# Patient Record
Sex: Male | Born: 1965
Health system: Southern US, Community
[De-identification: ages and names within clinical notes are randomized; demographics above are authoritative.]

## PROBLEM LIST (undated history)

## (undated) DIAGNOSIS — E785 Hyperlipidemia, unspecified: Secondary | ICD-10-CM

## (undated) DIAGNOSIS — F419 Anxiety disorder, unspecified: Secondary | ICD-10-CM

## (undated) DIAGNOSIS — I240 Acute coronary thrombosis not resulting in myocardial infarction: Secondary | ICD-10-CM

## (undated) DIAGNOSIS — I509 Heart failure, unspecified: Secondary | ICD-10-CM

## (undated) DIAGNOSIS — R7303 Prediabetes: Secondary | ICD-10-CM

## (undated) DIAGNOSIS — F1021 Alcohol dependence, in remission: Secondary | ICD-10-CM

## (undated) DIAGNOSIS — T7840XA Allergy, unspecified, initial encounter: Secondary | ICD-10-CM

## (undated) DIAGNOSIS — F329 Major depressive disorder, single episode, unspecified: Secondary | ICD-10-CM

## (undated) DIAGNOSIS — F32A Depression, unspecified: Secondary | ICD-10-CM

## (undated) HISTORY — DX: Prediabetes: R73.03

## (undated) HISTORY — PX: OTHER SURGICAL HISTORY: SHX169

## (undated) HISTORY — DX: Major depressive disorder, single episode, unspecified: F32.9

## (undated) HISTORY — DX: Hyperlipidemia, unspecified: E78.5

## (undated) HISTORY — DX: Anxiety disorder, unspecified: F41.9

## (undated) HISTORY — DX: Alcohol dependence, in remission: F10.21

## (undated) HISTORY — DX: Depression, unspecified: F32.A

## (undated) HISTORY — DX: Heart failure, unspecified: I50.9

## (undated) HISTORY — DX: Allergy, unspecified, initial encounter: T78.40XA

## (undated) HISTORY — DX: Acute coronary thrombosis not resulting in myocardial infarction: I24.0

---

## 2013-06-23 HISTORY — PX: CORONARY ANGIOPLASTY WITH STENT PLACEMENT: SHX49

## 2015-04-02 ENCOUNTER — Encounter: Payer: Self-pay | Admitting: Family Medicine

## 2015-04-02 ENCOUNTER — Ambulatory Visit (INDEPENDENT_AMBULATORY_CARE_PROVIDER_SITE_OTHER): Payer: Self-pay | Admitting: Family Medicine

## 2015-04-02 VITALS — BP 122/83 | HR 68 | Temp 98.2°F | Resp 14 | Ht 68.0 in | Wt 190.0 lb

## 2015-04-02 DIAGNOSIS — G8929 Other chronic pain: Secondary | ICD-10-CM

## 2015-04-02 DIAGNOSIS — F1021 Alcohol dependence, in remission: Secondary | ICD-10-CM

## 2015-04-02 DIAGNOSIS — M25559 Pain in unspecified hip: Secondary | ICD-10-CM

## 2015-04-02 DIAGNOSIS — F17200 Nicotine dependence, unspecified, uncomplicated: Secondary | ICD-10-CM

## 2015-04-02 DIAGNOSIS — K760 Fatty (change of) liver, not elsewhere classified: Secondary | ICD-10-CM

## 2015-04-02 DIAGNOSIS — M25552 Pain in left hip: Secondary | ICD-10-CM

## 2015-04-02 DIAGNOSIS — I5022 Chronic systolic (congestive) heart failure: Secondary | ICD-10-CM | POA: Insufficient documentation

## 2015-04-02 DIAGNOSIS — R748 Abnormal levels of other serum enzymes: Secondary | ICD-10-CM | POA: Insufficient documentation

## 2015-04-02 DIAGNOSIS — Z0000 Encounter for general adult medical examination without abnormal findings: Secondary | ICD-10-CM

## 2015-04-02 DIAGNOSIS — R7303 Prediabetes: Secondary | ICD-10-CM | POA: Insufficient documentation

## 2015-04-02 DIAGNOSIS — Z23 Encounter for immunization: Secondary | ICD-10-CM

## 2015-04-02 DIAGNOSIS — E785 Hyperlipidemia, unspecified: Secondary | ICD-10-CM | POA: Insufficient documentation

## 2015-04-02 LAB — CBC WITH DIFFERENTIAL/PLATELET
BASOS PCT: 0 % (ref 0–1)
Basophils Absolute: 0 10*3/uL (ref 0.0–0.1)
Eosinophils Absolute: 0.1 10*3/uL (ref 0.0–0.7)
Eosinophils Relative: 2 % (ref 0–5)
HCT: 46.8 % (ref 39.0–52.0)
HEMOGLOBIN: 16.6 g/dL (ref 13.0–17.0)
Lymphocytes Relative: 47 % — ABNORMAL HIGH (ref 12–46)
Lymphs Abs: 3.2 10*3/uL (ref 0.7–4.0)
MCH: 32.7 pg (ref 26.0–34.0)
MCHC: 35.5 g/dL (ref 30.0–36.0)
MCV: 92.3 fL (ref 78.0–100.0)
MONOS PCT: 8 % (ref 3–12)
MPV: 9.3 fL (ref 8.6–12.4)
Monocytes Absolute: 0.6 10*3/uL (ref 0.1–1.0)
NEUTROS ABS: 3 10*3/uL (ref 1.7–7.7)
NEUTROS PCT: 43 % (ref 43–77)
Platelets: 198 10*3/uL (ref 150–400)
RBC: 5.07 MIL/uL (ref 4.22–5.81)
RDW: 14.1 % (ref 11.5–15.5)
WBC: 6.9 10*3/uL (ref 4.0–10.5)

## 2015-04-02 LAB — COMPLETE METABOLIC PANEL WITH GFR
ALBUMIN: 4.4 g/dL (ref 3.6–5.1)
ALK PHOS: 65 U/L (ref 40–115)
ALT: 19 U/L (ref 9–46)
AST: 20 U/L (ref 10–35)
BILIRUBIN TOTAL: 0.4 mg/dL (ref 0.2–1.2)
BUN: 13 mg/dL (ref 7–25)
CO2: 28 mmol/L (ref 20–31)
Calcium: 10.2 mg/dL (ref 8.6–10.3)
Chloride: 102 mmol/L (ref 98–110)
Creat: 0.93 mg/dL (ref 0.70–1.33)
GFR, Est African American: >89 mL/min (ref >=60)
GFR, Est Non African American: >89 mL/min (ref >=60)
GLUCOSE: 88 mg/dL (ref 65–99)
Potassium: 4.7 mmol/L (ref 3.5–5.3)
Sodium: 138 mmol/L (ref 135–146)
TOTAL PROTEIN: 6.8 g/dL (ref 6.1–8.1)

## 2015-04-02 LAB — HEMOGLOBIN A1C
Hgb A1c MFr Bld: 5.8 % — ABNORMAL HIGH (ref <5.7)
MEAN PLASMA GLUCOSE: 120 mg/dL — AB (ref <117)

## 2015-04-02 LAB — LIPASE: LIPASE: 64 U/L — AB (ref 7–60)

## 2015-04-02 NOTE — Patient Instructions (Addendum)
Return to office for fasting cholesterol panel.  Recommend a lowfat, low carbohydrate diet divided over 5-6 small meals, increase water intake to 6-8 glasses, and 150 minutes per week of cardiovascular exercise.  Continue with current medication regimen.  Will call patient with laboratory results.    Heart Failure Heart failure is a condition in which the heart has trouble pumping blood. This means your heart does not pump blood efficiently for your body to work well. In some cases of heart failure, fluid may back up into your lungs or you may have swelling (edema) in your lower legs. Heart failure is usually a long-term (chronic) condition. It is important for you to take good care of yourself and follow your health care provider's treatment plan. CAUSES  Some health conditions can cause heart failure. Those health conditions include:  High blood pressure (hypertension). Hypertension causes the heart muscle to work harder than normal. When pressure in the blood vessels is high, the heart needs to pump (contract) with more force in order to circulate blood throughout the body. High blood pressure eventually causes the heart to become stiff and weak.  Coronary artery disease (CAD). CAD is the buildup of cholesterol and fat (plaque) in the arteries of the heart. The blockage in the arteries deprives the heart muscle of oxygen and blood. This can cause chest pain and may lead to a heart attack. High blood pressure can also contribute to CAD.  Heart attack (myocardial infarction). A heart attack occurs when one or more arteries in the heart become blocked. The loss of oxygen damages the muscle tissue of the heart. When this happens, part of the heart muscle dies. The injured tissue does not contract as well and weakens the heart's ability to pump blood.  Abnormal heart valves. When the heart valves do not open and close properly, it can cause heart failure. This makes the heart muscle pump harder to  keep the blood flowing.  Heart muscle disease (cardiomyopathy or myocarditis). Heart muscle disease is damage to the heart muscle from a variety of causes. These can include drug or alcohol abuse, infections, or unknown reasons. These can increase the risk of heart failure.  Lung disease. Lung disease makes the heart work harder because the lungs do not work properly. This can cause a strain on the heart, leading it to fail.  Diabetes. Diabetes increases the risk of heart failure. High blood sugar contributes to high fat (lipid) levels in the blood. Diabetes can also cause slow damage to tiny blood vessels that carry important nutrients to the heart muscle. When the heart does not get enough oxygen and food, it can cause the heart to become weak and stiff. This leads to a heart that does not contract efficiently.  Other conditions can contribute to heart failure. These include abnormal heart rhythms, thyroid problems, and low blood counts (anemia). Certain unhealthy behaviors can increase the risk of heart failure, including:  Being overweight.  Smoking or chewing tobacco.  Eating foods high in fat and cholesterol.  Abusing illicit drugs or alcohol.  Lacking physical activity. SYMPTOMS  Heart failure symptoms may vary and can be hard to detect. Symptoms may include:  Shortness of breath with activity, such as climbing stairs.  Persistent cough.  Swelling of the feet, ankles, legs, or abdomen.  Unexplained weight gain.  Difficulty breathing when lying flat (orthopnea).  Waking from sleep because of the need to sit up and get more air.  Rapid heartbeat.  Fatigue and loss  of energy.  Feeling light-headed, dizzy, or close to fainting.  Loss of appetite.  Nausea.  Increased urination during the night (nocturia). DIAGNOSIS  A diagnosis of heart failure is based on your history, symptoms, physical examination, and diagnostic tests. Diagnostic tests for heart failure may  include:  Echocardiography.  Electrocardiography.  Chest X-ray.  Blood tests.  Exercise stress test.  Cardiac angiography.  Radionuclide scans. TREATMENT  Treatment is aimed at managing the symptoms of heart failure. Medicines, behavioral changes, or surgical intervention may be necessary to treat heart failure.  Medicines to help treat heart failure may include:  Angiotensin-converting enzyme (ACE) inhibitors. This type of medicine blocks the effects of a blood protein called angiotensin-converting enzyme. ACE inhibitors relax (dilate) the blood vessels and help lower blood pressure.  Angiotensin receptor blockers (ARBs). This type of medicine blocks the actions of a blood protein called angiotensin. Angiotensin receptor blockers dilate the blood vessels and help lower blood pressure.  Water pills (diuretics). Diuretics cause the kidneys to remove salt and water from the blood. The extra fluid is removed through urination. This loss of extra fluid lowers the volume of blood the heart pumps.  Beta blockers. These prevent the heart from beating too fast and improve heart muscle strength.  Digitalis. This increases the force of the heartbeat.  Healthy behavior changes include:  Obtaining and maintaining a healthy weight.  Stopping smoking or chewing tobacco.  Eating heart-healthy foods.  Limiting or avoiding alcohol.  Stopping illicit drug use.  Physical activity as directed by your health care provider.  Surgical treatment for heart failure may include:  A procedure to open blocked arteries, repair damaged heart valves, or remove damaged heart muscle tissue.  A pacemaker to improve heart muscle function and control certain abnormal heart rhythms.  An internal cardioverter defibrillator to treat certain serious abnormal heart rhythms.  A left ventricular assist device (LVAD) to assist the pumping ability of the heart. HOME CARE INSTRUCTIONS   Take medicines only  as directed by your health care provider. Medicines are important in reducing the workload of your heart, slowing the progression of heart failure, and improving your symptoms.  Do not stop taking your medicine unless directed by your health care provider.  Do not skip any dose of medicine.  Refill your prescriptions before you run out of medicine. Your medicines are needed every day.  Engage in moderate physical activity if directed by your health care provider. Moderate physical activity can benefit some people. The elderly and people with severe heart failure should consult with a health care provider for physical activity recommendations.  Eat heart-healthy foods. Food choices should be free of trans fat and low in saturated fat, cholesterol, and salt (sodium). Healthy choices include fresh or frozen fruits and vegetables, fish, lean meats, legumes, fat-free or low-fat dairy products, and whole grain or high fiber foods. Talk to a dietitian to learn more about heart-healthy foods.  Limit sodium if directed by your health care provider. Sodium restriction may reduce symptoms of heart failure in some people. Talk to a dietitian to learn more about heart-healthy seasonings.  Use healthy cooking methods. Healthy cooking methods include roasting, grilling, broiling, baking, poaching, steaming, or stir-frying. Talk to a dietitian to learn more about healthy cooking methods.  Limit fluids if directed by your health care provider. Fluid restriction may reduce symptoms of heart failure in some people.  Weigh yourself every day. Daily weights are important in the early recognition of excess fluid. You should  weigh yourself every morning after you urinate and before you eat breakfast. Wear the same amount of clothing each time you weigh yourself. Record your daily weight. Provide your health care provider with your weight record.  Monitor and record your blood pressure if directed by your health care  provider.  Check your pulse if directed by your health care provider.  Lose weight if directed by your health care provider. Weight loss may reduce symptoms of heart failure in some people.  Stop smoking or chewing tobacco. Nicotine makes your heart work harder by causing your blood vessels to constrict. Do not use nicotine gum or patches before talking to your health care provider.  Keep all follow-up visits as directed by your health care provider. This is important.  Limit alcohol intake to no more than 1 drink per day for nonpregnant women and 2 drinks per day for men. One drink equals 12 ounces of beer, 5 ounces of wine, or 1 ounces of hard liquor. Drinking more than that is harmful to your heart. Tell your health care provider if you drink alcohol several times a week. Talk with your health care provider about whether alcohol is safe for you. If your heart has already been damaged by alcohol or you have severe heart failure, drinking alcohol should be stopped completely.  Stop illicit drug use.  Stay up-to-date with immunizations. It is especially important to prevent respiratory infections through current pneumococcal and influenza immunizations.  Manage other health conditions such as hypertension, diabetes, thyroid disease, or abnormal heart rhythms as directed by your health care provider.  Learn to manage stress.  Plan rest periods when fatigued.  Learn strategies to manage high temperatures. If the weather is extremely hot:  Avoid vigorous physical activity.  Use air conditioning or fans or seek a cooler location.  Avoid caffeine and alcohol.  Wear loose-fitting, lightweight, and light-colored clothing.  Learn strategies to manage cold temperatures. If the weather is extremely cold:  Avoid vigorous physical activity.  Layer clothes.  Wear mittens or gloves, a hat, and a scarf when going outside.  Avoid alcohol.  Obtain ongoing education and support as  needed.  Participate in or seek rehabilitation as needed to maintain or improve independence and quality of life. SEEK MEDICAL CARE IF:   You have a rapid weight gain.  You have increasing shortness of breath that is unusual for you.  You are unable to participate in your usual physical activities.  You tire easily.  You cough more than normal, especially with physical activity.  You have any or more swelling in areas such as your hands, feet, ankles, or abdomen.  You are unable to sleep because it is hard to breathe.  You feel like your heart is beating fast (palpitations).  You become dizzy or light-headed upon standing up. SEEK IMMEDIATE MEDICAL CARE IF:   You have difficulty breathing.  There is a change in mental status such as decreased alertness or difficulty with concentration.  You have a pain or discomfort in your chest.  You have an episode of fainting (syncope). MAKE SURE YOU:   Understand these instructions.  Will watch your condition.  Will get help right away if you are not doing well or get worse.   This information is not intended to replace advice given to you by your health care provider. Make sure you discuss any questions you have with your health care provider.   Document Released: 06/09/2005 Document Revised: 10/24/2014 Document Reviewed: 07/09/2012 Elsevier  Interactive Patient Education 2016 Else[employer] Inc[employer] Hip Pain Your hip is the joint between your upper legs and your lower pelvis. The bones, cartilage, tendons, and muscles of your hip joint perform a lot of work each day supporting your body weight and allowing you to move around. Hip pain can range from a minor ache to severe pain in one or both of your hips. Pain may be felt on the inside of the hip joint near the groin, or the outside near the buttocks and upper thigh. You may have swelling or stiffness as well.  HOME CARE INSTRUCTIONS   Take medicines only as directed by your health care  provider.  Apply ice to the injured area:  Put ice in a plastic bag.  Place a towel between your skin and the bag.  Leave the ice on for 15-20 minutes at a time, 3-4 times a day.  Keep your leg raised (elevated) when possible to lessen swelling.  Avoid activities that cause pain.  Follow specific exercises as directed by your health care provider.  Sleep with a pillow between your legs on your most comfortable side.  Record how often you have hip pain, the location of the pain, and what it feels like. SEEK MEDICAL CARE IF:   You are unable to put weight on your leg.  Your hip is red or swollen or very tender to touch.  Your pain or swelling continues or worsens after 1 week.  You have increasing difficulty walking.  You have a fever. SEEK IMMEDIATE MEDICAL CARE IF:   You have fallen.  You have a sudden increase in pain and swelling in your hip. MAKE SURE YOU:   Understand these instructions.  Will watch your condition.  Will get help right away if you are not doing well or get worse.   This information is not intended to replace advice given to you by your health care provider. Make sure you discuss any questions you have with your health care provider.   Document Released: 11/27/2009 Document Revised: 06/30/2014 Document Reviewed: 02/03/2013 Elsevier Interactive Patient Education 2016 Elsevier Inc. Cholesterol Cholesterol is a white, waxy, fat-like substance needed by your body in small amounts. The liver makes all the cholesterol you need. Cholesterol is carried from the liver by the blood through the blood vessels. Deposits of cholesterol (plaque) may build up on blood vessel walls. These make the arteries narrower and stiffer. Cholesterol plaques increase the risk for heart attack and stroke.  You cannot feel your cholesterol level even if it is very high. The only way to know it is high is with a blood test. Once you know your cholesterol levels, you should keep  a record of the test results. Work with your health care provider to keep your levels in the desired range.  WHAT DO THE RESULTS MEAN?  Total cholesterol is a rough measure of all the cholesterol in your blood.   LDL is the so-called bad cholesterol. This is the type that deposits cholesterol in the walls of the arteries. You want this level to be low.   HDL is the good cholesterol because it cleans the arteries and carries the LDL away. You want this level to be high.  Triglycerides are fat that the body can either burn for energy or store. High levels are closely linked to heart disease.  WHAT ARE THE DESIRED LEVELS OF CHOLESTEROL?  Total cholesterol below 200.   LDL below 100 for people at risk, below 70 for  those at very high risk.   HDL above 50 is good, above 60 is best.   Triglycerides below 150.  HOW CAN I LOWER MY CHOLESTEROL?  Diet. Follow your diet programs as directed by your health care provider.   Choose fish or white meat chicken and [place], roasted or baked. Limit fatty cuts of red meat, fried foods, and processed meats, such as sausage and lunch meats.   Eat lots of fresh fruits and vegetables.  Choose whole grains, beans, pasta, potatoes, and cereals.   Use only small amounts of olive, corn, or canola oils.   Avoid butter, mayonnaise, shortening, or palm kernel oils.  Avoid foods with trans fats.   Drink skim or nonfat milk and eat low-fat or nonfat yogurt and cheeses. Avoid whole milk, cream, ice cream, egg yolks, and full-fat cheeses.   Healthy desserts include angel food cake, ginger snaps, animal crackers, hard candy, popsicles, and low-fat or nonfat frozen yogurt. Avoid pastries, cakes, pies, and cookies.   Exercise. Follow your exercise programs as directed by your health care provider.   A regular program helps decrease LDL and raise HDL.   A regular program helps with weight control.   Do things that increase your activity level  like gardening, walking, or taking the stairs. Ask your health care provider about how you can be more active in your daily life.   Medicine. Take medicine only as directed by your health care provider.   Medicine may be prescribed by your health care provider to help lower cholesterol and decrease the risk for heart disease.   If you have several risk factors, you may need medicine even if your levels are normal.   This information is not intended to replace advice given to you by your health care provider. Make sure you discuss any questions you have with your health care provider.   Document Released: 03/04/2001 Document Revised: 06/30/2014 Document Reviewed: 03/23/2013 Elsevier Interactive Patient Education [employer].

## 2015-04-02 NOTE — Progress Notes (Signed)
Subjective:    Patient ID: Alex Charles, male    DOB: 05-09-1966, 49 y.o.   MRN: [id number]  HPI Alex Charles, a 49 year old male presents to establish care. He states that he recently relocated from [place]. He reports that he was a patient of Dr. Kathlene November at [facility]. He reports that he had a Platinum Chromium Coronary Stent System  placed on October 17, 2014. He states that he is taking medications consistently. He reports that his last follow up appointment is in May. He also states that he is an alcoholic in remission. He has not had a drink since April 2016. He states that he has a history of alcoholic pancreatitis with elevated lipase levels. He states that he is an everyday tobacco smoker of 0.5 packs per day. He reports that he has attempted smoking cession in the past without success.   Patient complains of left hip pain. Left hip pain has been occurring for the past year.  Pain intensity is currently 2/10. Pain is alleviated by rest and over the counter Aleve last taken several days ago.Radiates to left groin or left knee. Family history of arthris. Current symptoms include is worse with weight bearing and is worse after period of inactivity. Associated symptoms:Aggravating symptoms: going up and down stairs, lateral movements and rising after sitting.Patient has not had any evaluation of hip pain to date.  History reviewed. No pertinent past medical history. Immunization History  Administered Date(s) Administered  . Influenza,inj,Quad PF,36+ Mos 04/02/2015   Social History   Social History  . Marital Status: Single    Spouse Name: N/A  . Number of Children: N/A  . Years of Education: N/A   Occupational History  . Not on file.   Social History Main Topics  . Smoking status: Current Every Day Smoker -- 0.50 packs/day  . Smokeless tobacco: Never Used  . Alcohol Use: No  . Drug Use: No  . Sexual Activity: Not on file   Other Topics Concern   . Not on file   Social History Narrative  . No narrative on file    Review of Systems  Constitutional: Negative.  Negative for fatigue.  Eyes: Negative.  Negative for photophobia and visual disturbance.  Respiratory: Negative.   Cardiovascular: Negative.   Gastrointestinal: Positive for constipation. Negative for blood in stool.  Endocrine: Negative for polydipsia, polyphagia and polyuria.  Genitourinary: Negative.  Negative for urgency and testicular pain.  Musculoskeletal: Positive for arthralgias (left hip pain).  Skin: Negative.   Allergic/Immunologic: Negative.  Negative for immunocompromised state.  Neurological: Negative.  Negative for dizziness, facial asymmetry and numbness.  Hematological: Negative.   Psychiatric/Behavioral: Positive for sleep disturbance. Negative for suicidal ideas. The patient is nervous/anxious.    Urinalysis results.     Objective:   Physical Exam  Constitutional: He is oriented to person, place, and time. He appears well-developed and well-nourished.  HENT:  Head: Normocephalic and atraumatic.  Right Ear: External ear normal.  Left Ear: External ear normal.  Nose: Nose normal.  Mouth/Throat: Oropharynx is clear and moist.  Eyes: Conjunctivae and EOM are normal. Pupils are equal, round, and reactive to light.  Neck: Normal range of motion. Neck supple.  Cardiovascular: Normal rate, regular rhythm, normal heart sounds and intact distal pulses.   Pulmonary/Chest: Effort normal and breath sounds normal.  Abdominal: Soft. Bowel sounds are normal.  Musculoskeletal: Normal range of motion.  Neurological: He is alert and oriented to person, place,  and time. He has normal reflexes.  Skin: Skin is warm and dry. Nails show no clubbing.  Round, raised,  Skin tag, rough, and non tender to palpation.  Right posterior leg. 0.5 cm     Psychiatric: He has a normal mood and affect. His behavior is normal. Thought content normal.          BP 122/83  mmHg  Pulse 68  Temp(Src) 98.2 F (36.8 C) (Oral)  Resp 14  Ht 5\' 8"  (1.727 m)  Wt 190 lb (86.183 kg)  BMI 28.90 kg/m2 Assessment & Plan:  1. Chronic systolic heart failure ([place]) Will send referral to cardiologist for further evaluation due to extensive cardiovascular history. Patient had a platinum chromium coronry stent system placed in April 2016 by Dr. Dorothyann Peng. Patient states that he has not followed with cardiology since May due to insurance constraints. Will continue with current medication regimen.    - clopidogrel (PLAVIX) 75 MG tablet; Take 75 mg by mouth daily. - spironolactone (ALDACTONE) 25 MG tablet; Take 25 mg by mouth daily. - furosemide (LASIX) 20 MG tablet; Take 20 mg by mouth. - carvedilol (COREG) 3.125 MG tablet; Take 3.125 mg by mouth 2 (two) times daily with a meal. 1/2 tablet bid  2. Hyperlipidemia LDL goal <100  - clopidogrel (PLAVIX) 75 MG tablet; Take 75 mg by mouth daily. - aspirin 81 MG chewable tablet; Chew by mouth daily. - EKG 12-Lead  3. Fatty liver Reviewed medical records from [place]. Patient has a history of fatty liver. Continue low fat diet and exercise regimen.   4. Prediabetes Reviewed labs, previous hemoglobin A1C was 5.7 %. Will re-check hemoglobin A1C. Recommend a lowfat, low carbohydrate diet divided over 5-6 small meals, increase water intake to 6-8 glasses, and 150 minutes per week of cardiovascular exercise.    5. Alcoholism in remission [facility]) Alex Charles has not had an alcoholic beverage in 6 months. Will continue to refrain from alcohol use.  - folic acid (FOLVITE) 158 MCG tablet; Take 400 mcg by mouth daily.  6.Hip pain, chronic, left - Naproxen Sodium (ALEVE PO); Take by mouth. - Sedimentation Rate    7. Elevated lipase  - Lipase  8. Need for immunization against influenza  - Flu Vaccine QUAD 36+ mos IM (Fluarix)   9. Routine health maintenance Will schedule follow up for prostate exam. Will update vaccinations in  chart.  - POCT urinalysis dipstick - Hemoglobin A1c - CBC with Differential - COMPLETE METABOLIC PANEL WITH GFR - TSH  The patient was given clear instructions to go to ER or return to medical center if symptoms do not improve, worsen or new problems develop. The patient verbalized understanding. Will notify patient with laboratory results. Alex Dew, FNP

## 2015-04-03 LAB — TSH: TSH: 1.853 u[IU]/mL (ref 0.350–4.500)

## 2015-04-04 ENCOUNTER — Telehealth: Payer: Self-pay | Admitting: Family Medicine

## 2015-04-04 ENCOUNTER — Ambulatory Visit: Payer: Self-pay | Admitting: Family Medicine

## 2015-04-04 NOTE — Telephone Encounter (Signed)
Reviewed labs. Hemoglobin a1C elevated at 5.8%, goal is <5.7%. Recommend a lowfat, low carbohydrate diet divided over 5-6 small meals, increase water intake to 6-8 glasses, and 150 minutes per week of cardiovascular exercise.   Lipase mildly elevated at 64. History of fatty liver disease noted.    Patient to follow up in 6 months for complete physical examination.    Dorena Dew, FNP

## 2015-04-04 NOTE — Telephone Encounter (Signed)
Patient called and advised of labs and recommendations [place] has advised. Patient had no further questions at this time. Thanks!

## 2015-05-21 ENCOUNTER — Encounter (HOSPITAL_COMMUNITY): Payer: Self-pay | Admitting: Emergency Medicine

## 2015-05-21 ENCOUNTER — Emergency Department (INDEPENDENT_AMBULATORY_CARE_PROVIDER_SITE_OTHER)
Admission: EM | Admit: 2015-05-21 | Discharge: 2015-05-21 | Disposition: A | Discharge status: Home / Self Care | Payer: Self-pay | Source: Home / Self Care | Attending: Family Medicine | Admitting: Family Medicine

## 2015-05-21 DIAGNOSIS — M5431 Sciatica, right side: Secondary | ICD-10-CM

## 2015-05-21 MED ORDER — PREDNISONE 20 MG PO TABS: ORAL_TABLET | ORAL | Status: DC

## 2015-05-21 NOTE — ED Notes (Signed)
C/o right leg pain onset 1 month.... Report pain starts at hip and radiates down the right leg Recalls pain began when he got up from a picnic table and felt a sharp pain A&O x4... No acute distress.

## 2015-05-21 NOTE — Discharge Instructions (Signed)

## 2015-05-21 NOTE — ED Provider Notes (Signed)
CSN: [id number]     Arrival date & time 05/21/15  1604 History   First MD Initiated Contact with Patient 05/21/15 1803     Chief Complaint  Patient presents with  . Leg Pain   (Consider location/radiation/quality/duration/timing/severity/associated sxs/prior Treatment) HPI Comments: [age]-year-old male complaining of right leg pain. It started approximately 7-10 days ago. He states that he was sitting on a picnic table and as he was getting off the table he experienced sudden acute pain. The pain is primarily constant but often worse with certain positions, prolonged sitting, bending and flexion at the hip. Pain is located to the upper and mid right buttock. It tends to radiate down the middle of the right buttock and then laterally to the anterior thigh and knee. No known trauma. No blunt trauma. No falls. He also states there is a loss of sensation over the right calf area.   History reviewed. No pertinent past medical history. Past Surgical History  Procedure Laterality Date  . Coronary angioplasty with stent placement     Family History  Problem Relation Age of Onset  . Cancer Mother     colon   . Hypertension Father    Social History  Substance Use Topics  . Smoking status: Current Every Day Smoker -- 0.50 packs/day  . Smokeless tobacco: Never Used  . Alcohol Use: No    Review of Systems  Constitutional: Positive for activity change. Negative for fever and fatigue.  HENT: Negative.   Respiratory: Negative.   Cardiovascular: Negative.   Gastrointestinal: Negative.   Musculoskeletal: Positive for back pain. Negative for joint swelling.  Skin: Negative.   Neurological: Positive for numbness. Negative for dizziness, tremors, syncope, facial asymmetry, speech difficulty and headaches.    Allergies  Review of patient's allergies indicates no known allergies.  Home Medications   Prior to Admission medications   Medication Sig Start Date End Date Taking? Authorizing  Provider  acetaminophen (TYLENOL) 500 MG tablet Take 500 mg by mouth every 6 (six) hours as needed.   Yes Historical Provider, MD  aspirin 81 MG chewable tablet Chew by mouth daily.   Yes Historical Provider, MD  carvedilol (COREG) 3.125 MG tablet Take 3.125 mg by mouth 2 (two) times daily with a meal. 1/2 tablet bid   Yes Historical Provider, MD  clopidogrel (PLAVIX) 75 MG tablet Take 75 mg by mouth daily.   Yes Historical Provider, MD  folic acid (FOLVITE) A999333 MCG tablet Take 400 mcg by mouth daily.   Yes Historical Provider, MD  furosemide (LASIX) 20 MG tablet Take 20 mg by mouth.   Yes Historical Provider, MD  spironolactone (ALDACTONE) 25 MG tablet Take 25 mg by mouth daily.   Yes Historical Provider, MD  Naproxen Sodium (ALEVE PO) Take by mouth.    Historical Provider, MD  predniSONE (DELTASONE) 20 MG tablet 3 Tabs PO Days 1-3, then 2 tabs PO Days 4-6, then 1 tab PO Day 7-9, then Half Tab PO Day 10-12 05/21/15   Janne Napoleon, NP   Meds Ordered and Administered this Visit  Medications - No data to display  BP 126/88 mmHg  Pulse 84  Temp(Src) 98.8 F (37.1 C) (Oral)  Resp 16  SpO2 96% No data found.   Physical Exam  Constitutional: He appears well-developed and well-nourished. No distress.  Neck: Normal range of motion. Neck supple.  Cardiovascular: Normal rate.   Pulmonary/Chest: Effort normal. No respiratory distress.  Musculoskeletal: He exhibits tenderness. He exhibits no edema.  With  deep palpation there is tenderness to the upper mid right buttock. Lesser tenderness to the lateral hip. There is also tenderness to the anterior quadriceps  Straight leg raise is positive.  passive flexion  the right hip produces severe pain within the hip that radiates along the lateral and anterior thigh.   Neurological: He is alert.  Skin: Skin is warm and dry.  Psychiatric: He has a normal mood and affect.  Nursing note and vitals reviewed.   ED Course  Procedures (including critical  care time)  Labs Review Labs Reviewed - No data to display  Imaging Review No results found.   Visual Acuity Review  Right Eye Distance:   Left Eye Distance:   Bilateral Distance:    Right Eye Near:   Left Eye Near:    Bilateral Near:         MDM   1. Sciatica neuralgia, right    Prednisone taper F/u with PCP this week. May need additional testing.    Janne Napoleon, NP 05/21/15 [phone]

## 2015-05-29 ENCOUNTER — Ambulatory Visit (INDEPENDENT_AMBULATORY_CARE_PROVIDER_SITE_OTHER): Payer: Self-pay | Admitting: Family Medicine

## 2015-05-29 ENCOUNTER — Ambulatory Visit (HOSPITAL_COMMUNITY)
Admission: RE | Admit: 2015-05-29 | Discharge: 2015-05-29 | Disposition: A | Discharge status: Home / Self Care | Payer: Medicaid Other | Source: Ambulatory Visit | Attending: Family Medicine | Admitting: Family Medicine

## 2015-05-29 DIAGNOSIS — G8929 Other chronic pain: Secondary | ICD-10-CM

## 2015-05-29 DIAGNOSIS — M25551 Pain in right hip: Secondary | ICD-10-CM | POA: Insufficient documentation

## 2015-05-29 DIAGNOSIS — K760 Fatty (change of) liver, not elsewhere classified: Secondary | ICD-10-CM

## 2015-05-29 DIAGNOSIS — M5431 Sciatica, right side: Secondary | ICD-10-CM

## 2015-05-29 LAB — COMPLETE METABOLIC PANEL WITH GFR
ALBUMIN: 4.5 g/dL (ref 3.6–5.1)
ALK PHOS: 64 U/L (ref 40–115)
ALT: 23 U/L (ref 9–46)
AST: 22 U/L (ref 10–35)
BILIRUBIN TOTAL: 0.5 mg/dL (ref 0.2–1.2)
BUN: 13 mg/dL (ref 7–25)
CALCIUM: 9.5 mg/dL (ref 8.6–10.3)
CO2: 26 mmol/L (ref 20–31)
Chloride: 103 mmol/L (ref 98–110)
Creat: 0.77 mg/dL (ref 0.70–1.33)
GFR, Est African American: >89 mL/min (ref >=60)
GFR, Est Non African American: >89 mL/min (ref >=60)
Glucose, Bld: 94 mg/dL (ref 65–99)
POTASSIUM: 4.5 mmol/L (ref 3.5–5.3)
Sodium: 137 mmol/L (ref 135–146)
TOTAL PROTEIN: 6.8 g/dL (ref 6.1–8.1)

## 2015-05-29 MED ORDER — GABAPENTIN 100 MG PO CAPS: 100.0000 mg | ORAL_CAPSULE | Freq: Three times a day (TID) | ORAL | Status: DC

## 2015-05-29 NOTE — Progress Notes (Signed)
Subjective:    Patient ID: Alex Charles, male    DOB: February 03, 1966, 49 y.o.   MRN: [id number]  Hip Pain  The incident occurred more than 1 week ago. Incident location: Patient transitioned from sitting to standing at a picnic several weeks ago and felt sudden right hip pain. The injury mechanism is unknown. The pain is present in the right hip (Burning, shooting pain is radiating to right leg intermittently. ). The quality of the pain is described as burning and shooting. The pain is at a severity of 4/10. The pain is moderate. The pain has been intermittent since onset. Associated symptoms include tingling. Pertinent negatives include no inability to bear weight, loss of motion, loss of sensation or muscle weakness. He reports no foreign bodies present. The symptoms are aggravated by movement. He has tried acetaminophen (Reports that he has increased Tylenol intake over past 2 weeks) for the symptoms. The treatment provided no relief (Patient was also evaluated in urgent care several weeks ago and started on a tapered dose prednisone pack with minimal relief).   Past Medical History  Diagnosis Date  . Prediabetes   . Hyperlipidemia   . CHF (congestive heart failure) ([place])     Social History   Social History  . Marital Status: Single    Spouse Name: N/A  . Number of Children: N/A  . Years of Education: N/A   Occupational History  . Not on file.   Social History Main Topics  . Smoking status: Current Every Day Smoker -- 0.50 packs/day  . Smokeless tobacco: Never Used  . Alcohol Use: No  . Drug Use: No  . Sexual Activity: Not on file   Other Topics Concern  . Not on file   Social History Narrative   Immunization History  Administered Date(s) Administered  . Influenza,inj,Quad PF,36+ Mos 04/02/2015   Review of Systems  Constitutional: Negative.   HENT: Negative.   Eyes: Negative.   Endocrine: Negative for polydipsia, polyphagia and polyuria.  Musculoskeletal: Positive for  myalgias (right hip pain radiating to right leg) and gait problem.  Allergic/Immunologic: Negative.   Neurological: Positive for tingling.       Tingling to right leg  Hematological: Negative.   Psychiatric/Behavioral: Negative.         Objective:   Physical Exam  Constitutional: He is oriented to person, place, and time. He appears well-developed and well-nourished.  HENT:  Head: Normocephalic and atraumatic.  Right Ear: External ear normal.  Left Ear: External ear normal.  Mouth/Throat: Oropharynx is clear and moist.  Neck: Normal range of motion. Neck supple.  Cardiovascular: Normal rate, regular rhythm, normal heart sounds and intact distal pulses.   Abdominal: Soft. Bowel sounds are normal.  Genitourinary: Rectum normal, prostate normal and penis normal.  Musculoskeletal:       Right hip: He exhibits decreased range of motion, decreased strength and tenderness. He exhibits no bony tenderness, no swelling, no crepitus, no deformity and no laceration.       Right knee: He exhibits normal range of motion.       Left knee: He exhibits normal range of motion and no swelling.  Patient guarding with straight leg lifts.  Decreased passive ROM to right lower extremity.   Neurological: He is alert and oriented to person, place, and time. He has normal reflexes.  Skin: Skin is warm and dry.  Psychiatric: He has a normal mood and affect. His behavior is normal. Judgment and thought content normal.  BP 139/85 mmHg  Pulse 90  Temp(Src) 98.1 F (36.7 C) (Oral)  Resp 16  Ht 5' 8.5" (1.74 m)  Wt 198 lb (89.812 kg)  BMI 29.66 kg/m2 Assessment & Plan:  1. Acute right hip pain Patient had sudden hip pain with transitioning from sitting to standing at a picnic several weeks ago. He has noticed an increased in burning, shooting pain to right leg. Will send Mr. Suzan Slick for an xray of right hip to rule out fracture or bony abnormalities. Will also start a trial of gabapentin to assist with  neuropathic pain. Will notify by phone to discuss xray results. Will follow up in 1 month for re-evaluation. Recommend that patient refrain from increasing Tylenol intake due to history of elevated liver enzymes.  - DG HIP UNILAT WITH PELVIS 2-3 VIEWS RIGHT; Future  2. Sciatica neuralgia, right Refer to #1 - gabapentin (NEURONTIN) 100 MG capsule; Take 1 capsule (100 mg total) by mouth 3 (three) times daily.  Dispense: 90 capsule; Refill: 0  3. Fatty liver - COMPLETE METABOLIC PANEL WITH GFR   RTC: 1 month for right hip pain, may warrant referral to orthopedic care for further evaluation if hip pain continues.    The patient was given clear instructions to go to ER or return to medical center if symptoms do not improve, worsen or new problems develop. The patient verbalized understanding. Will notify patient with laboratory results. Cammie Sickle, FNP-C [facility]

## 2015-05-30 ENCOUNTER — Encounter: Payer: Self-pay | Admitting: Family Medicine

## 2015-05-30 DIAGNOSIS — M5430 Sciatica, unspecified side: Secondary | ICD-10-CM | POA: Insufficient documentation

## 2015-05-30 DIAGNOSIS — M25551 Pain in right hip: Secondary | ICD-10-CM | POA: Insufficient documentation

## 2015-05-30 NOTE — Patient Instructions (Signed)

## 2015-05-31 ENCOUNTER — Telehealth: Payer: Self-pay

## 2015-05-31 NOTE — Telephone Encounter (Signed)
-----   Message from Dorena Dew, [place] sent at 05/30/2015  6:25 PM EST ----- Please inform patient that previously elevated liver enzymes have normalized. Only take Tylenol as directed, do not exceed daily recommended dosage. Hip x-ray has normalized. Plaque build up was noted on x-ray (will continue statin therapy and daily aspirn as previously prescribed). If he has any further questions or concerns, we will discuss during follow up appointment.    Thanks  ----- Message -----    From: Lab in [facility]    Sent: 05/29/2015  11:19 PM      To: Dorena Dew, FNP

## 2015-05-31 NOTE — Telephone Encounter (Signed)
Called no answer, left message for patient to return call regarding labs. Thanks!

## 2015-05-31 NOTE — Telephone Encounter (Signed)
Patient aware of lab results and recommendations.  Please send statin to pharmacy.

## 2015-07-04 ENCOUNTER — Ambulatory Visit (INDEPENDENT_AMBULATORY_CARE_PROVIDER_SITE_OTHER): Payer: Self-pay | Admitting: Family Medicine

## 2015-07-04 ENCOUNTER — Encounter: Payer: Self-pay | Admitting: Family Medicine

## 2015-07-04 DIAGNOSIS — M25552 Pain in left hip: Secondary | ICD-10-CM

## 2015-07-04 DIAGNOSIS — M5431 Sciatica, right side: Secondary | ICD-10-CM

## 2015-07-04 DIAGNOSIS — M25551 Pain in right hip: Secondary | ICD-10-CM

## 2015-07-04 DIAGNOSIS — G8929 Other chronic pain: Secondary | ICD-10-CM | POA: Insufficient documentation

## 2015-07-04 DIAGNOSIS — I5022 Chronic systolic (congestive) heart failure: Secondary | ICD-10-CM

## 2015-07-04 MED ORDER — KETOROLAC TROMETHAMINE 60 MG/2ML IM SOLN
30.0000 mg | Freq: Once | INTRAMUSCULAR | Status: AC
  Administered 2015-07-04: 30 mg via INTRAMUSCULAR

## 2015-07-04 NOTE — Progress Notes (Signed)
Subjective:    Patient ID: Alex Charles, male    DOB: 12/15/1965, 50 y.o.   MRN: [id number]  HPI Mr. Alex Charles, a 50 year old male presents for follow up of right hip pain. He transitioned from sitting to standing for a picnic table several months ago and felt sudden pain to his right hip. Pain is described as burning, shooting and radiated down right leg. Current pain intensity is 4/10. He last had Gabapentin and Tylenol on last night with minimal relief.  Also,  Alex Charles relocated from [place] several months ago. Marland Kitchen He reports that he was a patient of Dr. Kathlene November at [facility]. He reports that he had a Platinum Chromium Coronary Stent System placed on October 17, 2014. He states that he is taking medications consistently. He reports that his last follow up appointment is in May. He states that he is an everyday tobacco smoker of 0.5 packs per day. He reports that he has attempted smoking cession in the past without success. He denies chest pain, palpitations, near syncope, tachypnea, or lower extremity edema.  Past Medical History  Diagnosis Date  . Prediabetes   . Hyperlipidemia   . CHF (congestive heart failure) ([place])     Social History   Social History  . Marital Status: Single    Spouse Name: N/A  . Number of Children: N/A  . Years of Education: N/A   Occupational History  . Not on file.   Social History Main Topics  . Smoking status: Current Every Day Smoker -- 0.50 packs/day    Types: Cigarettes  . Smokeless tobacco: Never Used  . Alcohol Use: No  . Drug Use: No  . Sexual Activity: Not on file   Other Topics Concern  . Not on file   Social History Narrative   Immunization History  Administered Date(s) Administered  . Influenza,inj,Quad PF,36+ Mos 04/02/2015   Review of Systems  Constitutional: Negative.   HENT: Negative.   Eyes: Negative.   Endocrine: Negative for polydipsia, polyphagia and polyuria.  Musculoskeletal: Positive  for myalgias (right hip pain radiating to right leg) and gait problem.  Allergic/Immunologic: Negative.   Neurological: Positive for tingling.       Tingling to right leg  Hematological: Negative.   Psychiatric/Behavioral: Negative.         Objective:   Physical Exam  Constitutional: He is oriented to person, place, and time. He appears well-developed and well-nourished.  HENT:  Head: Normocephalic and atraumatic.  Right Ear: External ear normal.  Left Ear: External ear normal.  Mouth/Throat: Oropharynx is clear and moist.  Neck: Normal range of motion. Neck supple.  Cardiovascular: Normal rate, regular rhythm, normal heart sounds and intact distal pulses.   Abdominal: Soft. Bowel sounds are normal.  Genitourinary: Rectum normal, prostate normal and penis normal.  Musculoskeletal:       Right hip: He exhibits decreased range of motion, decreased strength and tenderness. He exhibits no bony tenderness, no swelling, no crepitus, no deformity and no laceration.       Right knee: He exhibits normal range of motion.       Left knee: He exhibits normal range of motion and no swelling.  Patient guarding with straight leg lifts.  Decreased passive ROM to right lower extremity.   Neurological: He is alert and oriented to person, place, and time. He has normal reflexes.  Skin: Skin is warm and dry.  Psychiatric: He has a normal mood and affect.  His behavior is normal. Judgment and thought content normal.      BP 121/81 mmHg  Pulse 87  Temp(Src) 98.1 F (36.7 C) (Oral)  Resp 16  Ht 5' 8.5" (1.74 m)  Wt 203 lb (92.08 kg)  BMI 30.41 kg/m2 Assessment & Plan:   1. Chronic pain of right hip Patient has continued to have right hip pain over the past several months. Patient had right hip xray 1 month ago. Will send a referral to orthopedic physician for further evaluation.  - ketorolac (TORADOL) injection 30 mg; Inject 1 mL (30 mg total) into the muscle once. - Ambulatory referral to  Sports Medicine  2. Sciatica neuralgia, right Refer to #1.   3. Chronic systolic heart failure ([place]) Will send referral to cardiologist for further evaluation due to extensive cardiovascular history. Patient had a platinum chromium coronry stent system placed in April 2016 by Dr. Dorothyann Peng. Patient states that he has not followed with cardiology since May due to insurance constraints. Will continue with current medication regimen.   - Ambulatory referral to Cardiology     The patient was given clear instructions to go to ER or return to medical center if symptoms do not improve, worsen or new problems develop. The patient verbalized understanding. Will follow-up by phone on 07/04/2015 Cammie Sickle, [facility]

## 2015-07-04 NOTE — Patient Instructions (Signed)

## 2015-07-05 ENCOUNTER — Other Ambulatory Visit: Payer: Self-pay | Admitting: Family Medicine

## 2015-07-05 DIAGNOSIS — G8929 Other chronic pain: Principal | ICD-10-CM

## 2015-07-05 DIAGNOSIS — M25551 Pain in right hip: Secondary | ICD-10-CM

## 2015-07-05 MED ORDER — MELOXICAM 7.5 MG PO TABS: 7.5000 mg | ORAL_TABLET | Freq: Every day | ORAL | Status: DC

## 2015-07-05 NOTE — Progress Notes (Signed)
Meds ordered this encounter  Medications  . meloxicam (MOBIC) 7.5 MG tablet    Sig: Take 1 tablet (7.5 mg total) by mouth daily.    Dispense:  30 tablet    Refill:  0

## 2015-07-18 ENCOUNTER — Ambulatory Visit
Admission: RE | Admit: 2015-07-18 | Discharge: 2015-07-18 | Disposition: A | Discharge status: Home / Self Care | Payer: Medicaid Other | Source: Ambulatory Visit | Attending: Family Medicine | Admitting: Family Medicine

## 2015-07-18 ENCOUNTER — Ambulatory Visit (INDEPENDENT_AMBULATORY_CARE_PROVIDER_SITE_OTHER): Payer: Self-pay | Admitting: Family Medicine

## 2015-07-18 VITALS — BP 136/86 | HR 86 | Ht 68.0 in | Wt 200.0 lb

## 2015-07-18 DIAGNOSIS — G8929 Other chronic pain: Secondary | ICD-10-CM

## 2015-07-18 DIAGNOSIS — M25551 Pain in right hip: Secondary | ICD-10-CM

## 2015-07-18 DIAGNOSIS — M5431 Sciatica, right side: Secondary | ICD-10-CM

## 2015-07-18 MED ORDER — PREDNISONE 10 MG PO TABS
ORAL_TABLET | ORAL | Status: DC
Start: 2015-07-18 — End: 2016-09-04

## 2015-07-18 NOTE — Progress Notes (Signed)
  Alex Charles - 50 y.o. male MRN [id number]  Date of birth: 09-06-1965  SUBJECTIVE:  Including CC & ROS.  Alex Charles is a 50 y.o. male who presents today for posterior R hip pain.    Hip Pain Posterior R hip -   Initial Visit (07/18/15) - Pt presents with ongoing R posterior hip pain for the past 2 months now. Describes radiculopathy going down the posterior aspect of his right leg. Does go into his foot. No specific injury but does have paresthesias. No bowel or bladder loss. No night sweats chills or fever or unintentional weight loss. He has tried Neurontin as well as Tylenol and Mobic with minimal relief. Did have a herniated disc about 25 years ago that resolved with conservative management.  PMHx - Updated and reviewed.  Contributory factors include: Previous disc herniation L4-L5 PSHx - Updated and reviewed.  Contributory factors include:  Noncontributory FHx - Updated and reviewed.  Contributory factors include:  Noncontributory Medications - updated and reviewed   12 point ROS negative other than per HPI.   Exam:  Filed Vitals:   07/18/15 1344  BP: 136/86  Pulse: 86   Gen: NAD, AAO 3 Cardiorespiratory - Normal respiratory effort/rate.  RRR Skin: No rashes or erythema Extremities: No edema, pulses +2 bilateral upper and lower extremity   Hip Exam:  Pelvic alignment unremarkable to inspection and palpation. Standing hip rotation and gait without trendelenburg / unsteadiness. Greater trochanter without tenderness to palpation. No tenderness over piriformis and greater trochanter. No SI joint tenderness and normal minimal SI movement. ROM: IR: 80 Deg, ER: 80 Deg, Flexion: 120 Deg, Extension: 100 Deg, Abduction: 45 Deg, Adduction: 45 Deg Strength:  IR: 5/5, ER: 5/5, Flexion (0 and 90 degrees): 5/5, Extension: 5/5, Abduction: 5/5, Adduction: 5/5 Negative Thomas test  Negative FADIR.  Negative FADIR with axial compression Negative FAIR and [place]  Negative FABER in  all directions, negative posterior shear, negative Ga[place]len  Negative Hop Test and Fulcrum  Negative Noble and Ober testing  + SLR on R   Neurovascularly intact B/L LE  Imaging: 2 view lumbar spine

## 2015-07-18 NOTE — Assessment & Plan Note (Signed)
Most likely has degenerative disc disease with possible acute disc herniation versus degenerative joint disease with foraminal stenosis. -We'll obtain two-view lumbar spine to further evaluate. -Stop NSAIDs and start Medrol Dosepak. Continue Tylenol when necessary and Ultram when necessary -Follow-up in 2 weeks and if continues would consider MRI. Since he has Medicaid it will be extremely hard to do physical therapy.

## 2015-07-31 ENCOUNTER — Encounter: Payer: Self-pay | Admitting: Cardiology

## 2015-07-31 ENCOUNTER — Ambulatory Visit (INDEPENDENT_AMBULATORY_CARE_PROVIDER_SITE_OTHER): Payer: Self-pay | Admitting: Cardiology

## 2015-07-31 VITALS — BP 116/82 | HR 82 | Ht 68.0 in | Wt 200.0 lb

## 2015-07-31 DIAGNOSIS — I5022 Chronic systolic (congestive) heart failure: Secondary | ICD-10-CM

## 2015-07-31 DIAGNOSIS — I2510 Atherosclerotic heart disease of native coronary artery without angina pectoris: Secondary | ICD-10-CM

## 2015-07-31 DIAGNOSIS — I2583 Coronary atherosclerosis due to lipid rich plaque: Secondary | ICD-10-CM

## 2015-07-31 NOTE — Patient Instructions (Signed)
Medication Instructions:   Your physician recommends that you continue on your current medications as directed. Please refer to the Current Medication list given to you today.    Testing/Procedures:  Your physician has requested that you have an echocardiogram. Echocardiography is a painless test that uses sound waves to create images of your heart. It provides your doctor with information about the size and shape of your heart and how well your heart's chambers and valves are working. This procedure takes approximately one hour. There are no restrictions for this procedure.    Follow-Up:  3 MONTHS WITH DR Marlou Porch     If you need a refill on your cardiac medications before your next appointment, please call your pharmacy.

## 2015-07-31 NOTE — Progress Notes (Signed)
Cardiology Office Note    Date:  07/31/2015   ID:  Alex Charles, DOB 05-May-1966, MRN [id number]  PCP:  Lamar Blinks, MD  Cardiologist:   Candee Furbish, MD     History of Present Illness:  Alex Charles is a 50 y.o. male  Here for evaluation of chronic systolic heart failure at the request of Silvestre Mesi , M.D.    He recently relocated from [place] several months ago. He was a patient of Dr. Kathlene November at [facility]. On 10/17/14 he had a platinum chromium coronary stent system placed. He has been medically compliant.  He has not followed up with cardiology since May 2016 secondary to insurance constraints.He is still smoking however half pack a day. Main complaint has been right hip pain.  09/2014 - PNA severe, in addition ETOH. 4/26 - hospital, organ were failing. Radial cath was then performed and stent was placed.  Having a harder time breathing. Gained weight. Moved to family. 2 jobs. 50 pound weight gain.   EF 30%.  He believes.   he admits that he has had trouble with his medication compliance.      Past Medical History  Diagnosis Date  . Prediabetes   . Hyperlipidemia   . CHF (congestive heart failure) [facility])     Past Surgical History  Procedure Laterality Date  . Coronary angioplasty with stent placement    . Cardiac valve replacement      Outpatient Prescriptions Prior to Visit  Medication Sig Dispense Refill  . acetaminophen (TYLENOL) 500 MG tablet Take 500 mg by mouth every 6 (six) hours as needed.    Marland Kitchen aspirin 81 MG chewable tablet Chew by mouth daily.    . carvedilol (COREG) 3.125 MG tablet Take 3.125 mg by mouth 2 (two) times daily with a meal. 1/2 tablet bid    . clopidogrel (PLAVIX) 75 MG tablet Take 75 mg by mouth daily.    . folic acid (FOLVITE) A999333 MCG tablet Take 400 mcg by mouth daily.    . furosemide (LASIX) 20 MG tablet Take 20 mg by mouth.    . meloxicam (MOBIC) 7.5 MG tablet Take 1 tablet (7.5 mg total) by mouth  daily. 30 tablet 0  . predniSONE (DELTASONE) 10 MG tablet Take as directed 48 tablet 0  . spironolactone (ALDACTONE) 25 MG tablet Take 25 mg by mouth daily.    Marland Kitchen gabapentin (NEURONTIN) 100 MG capsule Take 1 capsule (100 mg total) by mouth 3 (three) times daily. 90 capsule 0   No facility-administered medications prior to visit.     Allergies:   Review of patient's allergies indicates no known allergies.   Social History   Social History  . Marital Status: Single    Spouse Name: N/A  . Number of Children: N/A  . Years of Education: N/A   Social History Main Topics  . Smoking status: Current Every Day Smoker -- 0.50 packs/day    Types: Cigarettes  . Smokeless tobacco: Never Used  . Alcohol Use: No  . Drug Use: No  . Sexual Activity: Not Asked   Other Topics Concern  . None   Social History Narrative     Family History:  The patient's family history includes Cancer in his mother; Hypertension in his father. Father's side MI in 48's. Father is 72 getting knee replaced.   ROS:   Please see the history of present illness.    ROS weight gain , anxiety. All other systems reviewed and  are negative.   PHYSICAL EXAM:   VS:  BP 116/82 mmHg  Pulse 82  Ht 5\' 8"  (1.727 m)  Wt 200 lb (90.719 kg)  BMI 30.42 kg/m2   GEN: Well nourished, well developed, in no acute distress HEENT: normal Neck: no JVD, carotid bruits, or masses Cardiac: RRR; no murmurs, rubs, or gallops,no edema  Respiratory:  clear to auscultation bilaterally, normal work of breathing GI: soft, nontender, nondistended, + BS MS: no deformity or atrophy Skin: warm and dry, no rash Neuro:  Alert and Oriented x 3, Strength and sensation are intact Psych: euthymic mood, full affect  Wt Readings from Last 3 Encounters:  07/31/15 200 lb (90.719 kg)  07/18/15 200 lb (90.719 kg)  07/04/15 203 lb (92.08 kg)      Studies/Labs Reviewed:   EKG:   The EKG from 04/02/15 shows sinus rhythm with no other abnormality.    Recent Labs: 04/02/2015: Hemoglobin 16.6; Platelets 198; TSH 1.853 05/29/2015: ALT 23; BUN 13; Creat 0.77; Potassium 4.5; Sodium 137   Lipid Panel No results found for: CHOL, TRIG, HDL, CHOLHDL, VLDL, LDLCALC, LDLDIRECT  Additional studies/ records that were reviewed today include:   prior office notes, labs , EKG reviewed.    ASSESSMENT:    1. Chronic systolic heart failure ([place])   2. Coronary artery disease due to lipid rich plaque      PLAN:  In order of problems listed above:  1.  coronary artery disease- previous stent placed in April 2016. [place]. Overall not having any anginal symptoms. Doing well. Continue with Plavix until April 2017. After that point, we will continue with aspirin alone. 2.  Continuing with carvedilol , spironolactone. I do not see a current ACE inhibitor. We will repeat echocardiogram. His prior ejection fraction remembers was 30%. He will also come by with his previous records from [place] and drop them off at the office. 3.  alcohol use -prior pancreatitis with elevated lipase. In remission. He is now working to temporary jobs. He admits that he does not want a start the same cycle as before. He had been working since he was 50 years old. Everything collapsed on him. He is now trying start over. He admits that he has had some difficulty with medicine compliance. [place] stress the importance of Plavix. 4.  Tobacco use-encouraged cessation 5.  Hip pain-per primary 6.  history of fatty liver disease - gathered from medical records from [place].    Medication Adjustments/Labs and Tests Ordered: Current medicines are reviewed at length with the patient today.  Concerns regarding medicines are outlined above.  Medication changes, Labs and Tests ordered today are listed in the Patient Instructions below. There are no Patient Instructions on file for this visit.     Bobby Rumpf, MD  07/31/2015 9:13 AM    [facility] [address], [place], [place]  [address] Phone: [phone]; Fax: [phone]

## 2015-08-01 ENCOUNTER — Encounter: Payer: Self-pay | Admitting: Family Medicine

## 2015-08-01 ENCOUNTER — Ambulatory Visit (INDEPENDENT_AMBULATORY_CARE_PROVIDER_SITE_OTHER): Payer: Self-pay | Admitting: Family Medicine

## 2015-08-01 VITALS — BP 125/92 | Ht 68.0 in | Wt 200.6 lb

## 2015-08-01 DIAGNOSIS — M545 Low back pain, unspecified: Secondary | ICD-10-CM

## 2015-08-01 DIAGNOSIS — M5431 Sciatica, right side: Secondary | ICD-10-CM

## 2015-08-01 NOTE — Assessment & Plan Note (Signed)
Minimal improvement on sterapred dose pack.  Substantial DJD lumbar spine that I expect is impinging on his nerve root.   - MRI to evaluate - Consider Epidural injection vs discussion with neurosurgery.

## 2015-08-01 NOTE — Progress Notes (Signed)
  Alex Charles - 50 y.o. male MRN [id number]  Date of birth: 1965/11/14  SUBJECTIVE:  Including CC & ROS.  Alex Charles is a 50 y.o. male who presents today for posterior R hip pain.    Hip Pain Posterior R hip -   Initial Visit (07/18/15) - Pt presents with ongoing R posterior hip pain for the past 2 months now. Describes radiculopathy going down the posterior aspect of his right leg. Does go into his foot. No specific injury but does have paresthesias. No bowel or bladder loss. No night sweats chills or fever or unintentional weight loss. He has tried Neurontin as well as Tylenol and Mobic with minimal relief. Did have a herniated disc about 25 years ago that resolved with conservative management.  F/U Visit 08/01/15 - Pt had x-rays performed on 07/18/15 showing spondylosis as well as L4-5 1 anterolisthesis.  He was started on sterapred dose pack which helped mildly but still having substantial Sx.  No changes in history otherwise.   PMHx - Updated and reviewed.  Contributory factors include: Previous disc herniation L4-L5 PSHx - Updated and reviewed.  Contributory factors include: None  FHx - Updated and reviewed.  Contributory factors include:  None Medications - updated and reviewed   12 point ROS negative other than per HPI.   Exam:  Filed Vitals:   08/01/15 1401  BP: 125/92   Gen: NAD, AAO 3 Cardiorespiratory - Normal respiratory effort/rate.  RRR Skin: No rashes or erythema Extremities: No edema, pulses +2 bilateral upper and lower extremity   Hip Exam:  Pelvic alignment unremarkable to inspection and palpation. Standing hip rotation and gait without trendelenburg / unsteadiness. Greater trochanter without tenderness to palpation. No tenderness over piriformis and greater trochanter. No SI joint tenderness and normal minimal SI movement. + SLR on R.    Neurovascularly intact B/L LE  Imaging: 2 view lumbar spine 07/18/15 - DJD and joint space narrowing L4/L5 and L5/S1 with  slight anterolisthesis @ L4/L5

## 2015-08-09 ENCOUNTER — Ambulatory Visit (HOSPITAL_COMMUNITY): Payer: Self-pay | Attending: Cardiology

## 2015-08-09 ENCOUNTER — Other Ambulatory Visit: Payer: Self-pay

## 2015-08-09 ENCOUNTER — Ambulatory Visit
Admission: RE | Admit: 2015-08-09 | Discharge: 2015-08-09 | Disposition: A | Discharge status: Home / Self Care | Payer: Medicaid Other | Source: Ambulatory Visit | Attending: Family Medicine | Admitting: Family Medicine

## 2015-08-09 DIAGNOSIS — M545 Low back pain, unspecified: Secondary | ICD-10-CM

## 2015-08-09 DIAGNOSIS — I517 Cardiomegaly: Secondary | ICD-10-CM | POA: Insufficient documentation

## 2015-08-09 DIAGNOSIS — E785 Hyperlipidemia, unspecified: Secondary | ICD-10-CM | POA: Insufficient documentation

## 2015-08-09 DIAGNOSIS — I5022 Chronic systolic (congestive) heart failure: Secondary | ICD-10-CM | POA: Insufficient documentation

## 2015-08-09 DIAGNOSIS — Z720 Tobacco use: Secondary | ICD-10-CM | POA: Insufficient documentation

## 2015-08-09 DIAGNOSIS — I071 Rheumatic tricuspid insufficiency: Secondary | ICD-10-CM | POA: Insufficient documentation

## 2015-08-09 DIAGNOSIS — I5189 Other ill-defined heart diseases: Secondary | ICD-10-CM | POA: Insufficient documentation

## 2015-08-10 ENCOUNTER — Telehealth: Payer: Self-pay | Admitting: Family Medicine

## 2015-08-10 NOTE — Telephone Encounter (Signed)
Attempted to call pt in regards to his Lumbar MRI.  LVM To call back.  Please let him know he has severe lumbar spinal stenosis as well as a herniated disc that I think should be evaluated by neurosurgery.  Can we please place neurosurgery consult as well?  Thanks, Tamela Oddi. [facility] Fellow

## 2015-08-13 ENCOUNTER — Encounter: Payer: Self-pay | Admitting: *Deleted

## 2015-08-13 DIAGNOSIS — M48061 Spinal stenosis, lumbar region without neurogenic claudication: Secondary | ICD-10-CM

## 2015-08-13 DIAGNOSIS — M5126 Other intervertebral disc displacement, lumbar region: Secondary | ICD-10-CM

## 2015-08-13 NOTE — Progress Notes (Signed)
Patient ID: Alex Charles, male   DOB: 16-May-1966, 50 y.o.   MRN: [id number] Will send his PCP Silvestre Mesi) a message for her nurse to call and set up and appt with the Neurosurgeon given the patients lumbar spinal stenosis and herniated disc issues as well as his insurance being Medicaid

## 2015-08-14 NOTE — Progress Notes (Signed)
Patient ID: Alex Charles, male   DOB: 11-28-1965, 50 y.o.   MRN: [id number] The message from his "supposed" PCP, Silvestre Mesi, was:  "I would be glad to help but I don't think I'm actually his medicaid PCP. We have never taken medicaid at [facility] and I don't see where I have seen him in the last 3 years at least. You may have to call him and have him look at his medicaid card- I may be listed in Epic by mistake. Let me know if I can help after all!"  Called patient and informed his he should vet out the Medicaid situation so that his Medicaid PCP can refer him to see a neurosurgeon for his condition. Patient was also given his images results, which warrants the referral.

## 2015-09-28 ENCOUNTER — Encounter: Payer: Self-pay | Admitting: Cardiology

## 2015-09-28 ENCOUNTER — Ambulatory Visit (INDEPENDENT_AMBULATORY_CARE_PROVIDER_SITE_OTHER): Payer: Self-pay | Admitting: Cardiology

## 2015-09-28 ENCOUNTER — Ambulatory Visit: Payer: Self-pay | Admitting: Cardiology

## 2015-09-28 VITALS — BP 118/82 | HR 66 | Ht 68.5 in | Wt 214.2 lb

## 2015-09-28 DIAGNOSIS — I5022 Chronic systolic (congestive) heart failure: Secondary | ICD-10-CM

## 2015-09-28 DIAGNOSIS — F32A Depression, unspecified: Secondary | ICD-10-CM

## 2015-09-28 DIAGNOSIS — I2510 Atherosclerotic heart disease of native coronary artery without angina pectoris: Secondary | ICD-10-CM

## 2015-09-28 DIAGNOSIS — F329 Major depressive disorder, single episode, unspecified: Secondary | ICD-10-CM

## 2015-09-28 DIAGNOSIS — I2583 Coronary atherosclerosis due to lipid rich plaque: Secondary | ICD-10-CM

## 2015-09-28 MED ORDER — CARVEDILOL 3.125 MG PO TABS: 3.1250 mg | ORAL_TABLET | Freq: Two times a day (BID) | ORAL | Status: DC

## 2015-09-28 MED ORDER — SPIRONOLACTONE 25 MG PO TABS: 25.0000 mg | ORAL_TABLET | Freq: Every day | ORAL | Status: DC

## 2015-09-28 MED ORDER — FUROSEMIDE 20 MG PO TABS: 20.0000 mg | ORAL_TABLET | Freq: Every day | ORAL | Status: DC | PRN

## 2015-09-28 NOTE — Progress Notes (Signed)
Cardiology Office Note    Date:  09/28/2015   ID:  Alex Charles, DOB May 25, 1966, MRN [id number]  PCP:  Lamar Blinks, MD  Cardiologist:   Candee Furbish, MD     History of Present Illness:  Alex Charles is a 50 y.o. male  here for follow up of chronic systolic heart failure at the request of Silvestre Mesi , M.D.   He recently relocated from [place] several months ago. He was a patient of Dr. Kathlene November at [facility]. On 10/17/14 he had a platinum chromium coronary stent system placed. He has been medically compliant.  He has not followed up with cardiology since May 2016 secondary to insurance constraints.He is still smoking however half pack a day. Main complaint has been right hip pain.  09/2014 - PNA severe, in addition ETOH. 4/26 - hospital, organ were failing. Radial cath was then performed and stent was placed.  Having a harder time breathing. Gained weight. Moved to family. 2 jobs. 50 pound weight gain.   EF 30%Previously, now normal on echocardiogram in 2017. Excellent..   He admits that he has had trouble with his medication compliance.  09/28/15-no shortness of breath, no chest pain, no orthopnea. Thankfully, his echocardiogram shows resolution of his EF, now normal. He does however feel very depressed. Encouraged him to discuss this with Dr. Edilia Bo.      Past Medical History  Diagnosis Date  . Prediabetes   . Hyperlipidemia   . CHF (congestive heart failure) [facility])     Past Surgical History  Procedure Laterality Date  . Coronary angioplasty with stent placement    . Cardiac valve replacement      Outpatient Prescriptions Prior to Visit  Medication Sig Dispense Refill  . acetaminophen (TYLENOL) 500 MG tablet Take 500 mg by mouth every 6 (six) hours as needed.    Marland Kitchen aspirin 81 MG chewable tablet Chew by mouth daily.    . clopidogrel (PLAVIX) 75 MG tablet Take 75 mg by mouth daily.    . folic acid (FOLVITE) A999333 MCG tablet Take 400  mcg by mouth daily.    . meloxicam (MOBIC) 7.5 MG tablet Take 1 tablet (7.5 mg total) by mouth daily. 30 tablet 0  . predniSONE (DELTASONE) 10 MG tablet Take as directed 48 tablet 0  . carvedilol (COREG) 3.125 MG tablet Take 3.125 mg by mouth 2 (two) times daily with a meal. 1/2 tablet bid    . furosemide (LASIX) 20 MG tablet Take 20 mg by mouth.    . spironolactone (ALDACTONE) 25 MG tablet Take 25 mg by mouth daily.     No facility-administered medications prior to visit.     Allergies:   Review of patient's allergies indicates no known allergies.   Social History   Social History  . Marital Status: Single    Spouse Name: N/A  . Number of Children: N/A  . Years of Education: N/A   Social History Main Topics  . Smoking status: Current Every Day Smoker -- 0.50 packs/day    Types: Cigarettes  . Smokeless tobacco: Never Used  . Alcohol Use: No  . Drug Use: No  . Sexual Activity: Not Asked   Other Topics Concern  . None   Social History Narrative     Family History:  The patient's family history includes Cancer in his mother; Hypertension in his father. Father's side MI in 42's. Father is 58 getting knee replaced.   ROS:   Please see the history  of present illness.    ROS weight gain , anxiety. All other systems reviewed and are negative.   PHYSICAL EXAM:   VS:  BP 118/82 mmHg  Pulse 66  Ht 5' 8.5" (1.74 m)  Wt 214 lb 3.2 oz (97.16 kg)  BMI 32.09 kg/m2   GEN: Well nourished, well developed, in no acute distress HEENT: normal Neck: no JVD, carotid bruits, or masses Cardiac: RRR; no murmurs, rubs, or gallops,no edema  Respiratory:  clear to auscultation bilaterally, normal work of breathing GI: soft, nontender, nondistended, + BS MS: no deformity or atrophy Skin: warm and dry, no rash Neuro:  Alert and Oriented x 3, Strength and sensation are intact Psych: depressed mood, full affect  Wt Readings from Last 3 Encounters:  09/28/15 214 lb 3.2 oz (97.16 kg)    08/01/15 200 lb 9.6 oz (90.992 kg)  07/31/15 200 lb (90.719 kg)      Studies/Labs Reviewed:   EKG:   The EKG from 04/02/15 shows sinus rhythm with no other abnormality.   Recent Labs: 04/02/2015: Hemoglobin 16.6; Platelets 198; TSH 1.853 05/29/2015: ALT 23; BUN 13; Creat 0.77; Potassium 4.5; Sodium 137   Lipid Panel No results found for: CHOL, TRIG, HDL, CHOLHDL, VLDL, LDLCALC, LDLDIRECT  Additional studies/ records that were reviewed today include:   prior office notes, labs , EKG reviewed.  ECHO 08/09/15: - LVEF 60-65%, mild LVH, normal wall motion, diastolic dysfunction  with normal filling pressure, normal LA size, trivial TR with  normal RVSP and normal IVC.   ASSESSMENT:    1. Chronic systolic heart failure ([place])   2. Depression   3. Coronary artery disease due to lipid rich plaque      PLAN:  In order of problems listed above:  Coronary artery disease - previous stent placed in April 2016. [place]. Overall not having any anginal symptoms. Doing well. Continue with Plavix until April 2017. After that point, we will continue with aspirin alone.  Chronic systolic HF  - EF now normal 60%  - Continuing with carvedilol , spironolactone. I do not see a current ACE inhibitor.  His prior ejection fraction remembers was 30%. I will make his Lasix when necessary. We will continue with spironolactone at this point.  Alcohol use   -prior pancreatitis with elevated lipase. In remission. He is now working to temporary jobs. He admits that he does not want a start the same cycle as before. He had been working since he was 50 years old. Everything collapsed on him. He is now trying start over. He admits that he has had some difficulty with medicine compliance.   Depression -Encouraged him to discuss with Dr. Edilia Bo or one of her associates. He feels as though he is starting to go on a downward spiral.  Tobacco use  -encouraged cessation  Hip pain/back pain  -per primary   - His view is that insurance will not cover any treatment.  history of fatty liver disease  - gathered from medical records from [place].    Medication Adjustments/Labs and Tests Ordered: Current medicines are reviewed at length with the patient today.  Concerns regarding medicines are outlined above.  Medication changes, Labs and Tests ordered today are listed in the Patient Instructions below. Patient Instructions  Medication Instructions:  Please continue ASA lifelong however you may stop your Plavix at the end of April. Take Furosemide 20 mg as an as needed basis. Continue all other medications as listed.  Follow-Up: Follow up in 6 months  with Dr. Marlou Porch.  You will receive a letter in the mail 2 months before you are due.  Please call us when you receive this letter to schedule your follow up appointment.  Call Dr. Lorelei Pont to schedule follow up.   If you need a refill on your cardiac medications before your next appointment, please call your pharmacy.  Thank you for choosing [facility]!!             Signed, Candee Furbish, MD  09/28/2015 8:39 AM    [facility] [address], [place], [place]  [address] Phone: [phone]; Fax: [phone]

## 2015-09-28 NOTE — Patient Instructions (Signed)
Medication Instructions:  Please continue ASA lifelong however you may stop your Plavix at the end of April. Take Furosemide 20 mg as an as needed basis. Continue all other medications as listed.  Follow-Up: Follow up in 6 months with Dr. Marlou Porch.  You will receive a letter in the mail 2 months before you are due.  Please call us when you receive this letter to schedule your follow up appointment.  Call Dr. Lorelei Pont to schedule follow up.   If you need a refill on your cardiac medications before your next appointment, please call your pharmacy.  Thank you for choosing [facility]!!

## 2015-10-04 ENCOUNTER — Other Ambulatory Visit: Payer: Self-pay | Admitting: *Deleted

## 2015-10-04 DIAGNOSIS — I5022 Chronic systolic (congestive) heart failure: Secondary | ICD-10-CM

## 2015-10-04 MED ORDER — CARVEDILOL 3.125 MG PO TABS: ORAL_TABLET | ORAL | Status: DC

## 2015-10-11 ENCOUNTER — Telehealth: Payer: Self-pay | Admitting: Cardiology

## 2015-10-11 NOTE — Telephone Encounter (Signed)
New Message:  Donnelly Angelica called in wanting to get clarification on the directions for the pt's Carvedilol 3.25 medication. Please f/u with her

## 2015-10-11 NOTE — Telephone Encounter (Signed)
Please advise as office note reads as follows . carvedilol (COREG) 3.125 MG tablet Take 3.125 mg by mouth 2 (two) times daily with a meal. 1/2 tablet bid         Thanks, MI

## 2015-10-12 NOTE — Telephone Encounter (Signed)
Pt reports he is taking 3.125 mg 1/2 tablet BID

## 2015-10-12 NOTE — Telephone Encounter (Signed)
Pharmacy stated that they rx that they had on file had two different sigs. They will refill it for one-half tablet by mouth bid.

## 2015-11-08 ENCOUNTER — Ambulatory Visit (INDEPENDENT_AMBULATORY_CARE_PROVIDER_SITE_OTHER): Payer: Self-pay | Admitting: Family Medicine

## 2015-11-08 VITALS — BP 122/92 | HR 87 | Temp 98.7°F | Resp 16 | Ht 68.5 in | Wt 220.0 lb

## 2015-11-08 DIAGNOSIS — F329 Major depressive disorder, single episode, unspecified: Secondary | ICD-10-CM

## 2015-11-08 DIAGNOSIS — F17200 Nicotine dependence, unspecified, uncomplicated: Secondary | ICD-10-CM

## 2015-11-08 DIAGNOSIS — F32A Depression, unspecified: Secondary | ICD-10-CM

## 2015-11-08 DIAGNOSIS — F411 Generalized anxiety disorder: Secondary | ICD-10-CM

## 2015-11-08 MED ORDER — BUSPIRONE HCL 5 MG PO TABS: 5.0000 mg | ORAL_TABLET | Freq: Two times a day (BID) | ORAL | Status: DC

## 2015-11-08 NOTE — Progress Notes (Signed)
Subjective:    Patient ID: Alex Charles, male    DOB: [birth year], 50 y.o.   MRN: [id number]  Depression      The patient presents with depression.  This is a recurrent problem.  The current episode started in the past 7 days.   The onset quality is gradual.   The problem occurs constantly.  The problem has been gradually worsening since onset.  Associated symptoms include decreased concentration, hopelessness, irritable, decreased interest and sad.  Associated symptoms include no fatigue, no helplessness, does not have insomnia, no restlessness, no appetite change, no myalgias and no headaches.Suicidal ideas: Patient does not have a plan.     The symptoms are aggravated by family issues and work stress.  Past treatments include nothing.  Risk factors include stress.   Past medical history includes anxiety and depression.   Anxiety Presents for initial visit. Onset was in the past 7 days. The problem has been gradually worsening. Symptoms include decreased concentration, depressed mood, excessive worry, irritability and nervous/anxious behavior. Patient reports no chest pain, dizziness, dry mouth, insomnia, restlessness or shortness of breath. Suicidal ideas: Patient does not have a plan. Symptoms occur most days. The severity of symptoms is moderate. Nothing aggravates the symptoms.   His past medical history is significant for depression.   Past Medical History  Diagnosis Date  . Prediabetes   . Hyperlipidemia   . CHF (congestive heart failure) ([place])    Social History   Social History  . Marital Status: Single    Spouse Name: N/A  . Number of Children: N/A  . Years of Education: N/A   Occupational History  . Not on file.   Social History Main Topics  . Smoking status: Current Every Day Smoker -- 0.50 packs/day    Types: Cigarettes  . Smokeless tobacco: Never Used  . Alcohol Use: No  . Drug Use: No  . Sexual Activity: Not on file   Other Topics Concern  . Not on file    Social History Narrative   Past Surgical History  Procedure Laterality Date  . Coronary angioplasty with stent placement    . Cardiac valve replacement      Review of Systems  Constitutional: Positive for irritability. Negative for appetite change and fatigue.  HENT: Negative.   Eyes: Negative.  Negative for photophobia and visual disturbance.  Respiratory: Negative.  Negative for shortness of breath.   Cardiovascular: Negative.  Negative for chest pain.  Gastrointestinal: Negative.   Endocrine: Negative.   Genitourinary: Negative.   Musculoskeletal: Negative.  Negative for myalgias.  Skin: Negative.   Allergic/Immunologic: Negative.   Neurological: Negative for dizziness and headaches.  Hematological: Negative.   Psychiatric/Behavioral: Positive for depression and decreased concentration. Suicidal ideas: Patient does not have a plan. The patient is nervous/anxious. The patient does not have insomnia.       Objective:   Physical Exam  Constitutional: He is oriented to person, place, and time. He appears well-developed and well-nourished. He is irritable.  HENT:  Head: Normocephalic and atraumatic.  Right Ear: External ear normal.  Left Ear: External ear normal.  Nose: Nose normal.  Mouth/Throat: Oropharynx is clear and moist.  Eyes: Conjunctivae and EOM are normal. Pupils are equal, round, and reactive to light.  Neck: Normal range of motion. Neck supple.  Cardiovascular: Normal rate, regular rhythm, normal heart sounds and intact distal pulses.   Pulmonary/Chest: Effort normal and breath sounds normal.  Abdominal: Soft. Bowel sounds are normal.  Musculoskeletal: Normal  range of motion.  Neurological: He is alert and oriented to person, place, and time.  Skin: Skin is warm and dry.  Psychiatric: His behavior is normal. Judgment and thought content normal. His mood appears anxious. His speech is not rapid and/or pressured. He is not agitated and not aggressive. He  exhibits a depressed mood. He expresses no homicidal and no suicidal ideation. He expresses no suicidal plans and no homicidal plans.      BP 122/92 mmHg  Pulse 87  Temp(Src) 98.7 F (37.1 C) (Oral)  Resp 16  Ht 5' 8.5" (1.74 m)  Wt 220 lb (99.791 kg)  BMI 32.96 kg/m2  SpO2 98% Assessment & Plan:  1. Depression - busPIRone (BUSPAR) 5 MG tablet; Take 1 tablet (5 mg total) by mouth 2 (two) times daily.  Dispense: 60 tablet; Refill: 1 - Ambulatory referral to Psychology Depression screen [facility] 2/9 11/08/2015 08/01/2015 07/18/2015 07/04/2015 05/29/2015  Decreased Interest 3 0 0 1 0  Down, Depressed, Hopeless 2 0 0 1 0  PHQ - 2 Score 5 0 0 2 0  Altered sleeping 3 - - 3 -  Tired, decreased energy 3 - - 3 -  Change in appetite 3 - - 3 -  Feeling bad or failure about yourself  3 - - 2 -  Trouble concentrating 1 - - 0 -  Moving slowly or fidgety/restless 2 - - 1 -  Suicidal thoughts 3 - - 2 -  PHQ-9 Score 23 - - 16 -  Difficult doing work/chores Somewhat difficult - - Not difficult at all -    2. Generalized anxiety disorder - busPIRone (BUSPAR) 5 MG tablet; Take 1 tablet (5 mg total) by mouth 2 (two) times daily.  Dispense: 60 tablet; Refill: 1 - Ambulatory referral to Psychology GAD 7 : Generalized Anxiety Score 11/08/2015  Nervous, Anxious, on Edge 2  Control/stop worrying 1  Worry too much - different things 2  Trouble relaxing 2  Restless 1  Easily annoyed or irritable 2  Afraid - awful might happen 2  Total GAD 7 Score 12  Anxiety Difficulty Somewhat difficult     3. Tobacco dependence Smoking cessation instruction/counseling given:  counseled patient on the dangers of tobacco use, advised patient to stop smoking, and reviewed strategies to maximize success    RTC: 1 month for depression/anxiety Dorena Dew, FNP

## 2015-11-09 ENCOUNTER — Encounter: Payer: Self-pay | Admitting: Family Medicine

## 2015-11-09 DIAGNOSIS — F32A Depression, unspecified: Secondary | ICD-10-CM | POA: Insufficient documentation

## 2015-11-09 DIAGNOSIS — F411 Generalized anxiety disorder: Secondary | ICD-10-CM | POA: Insufficient documentation

## 2015-11-09 DIAGNOSIS — F17200 Nicotine dependence, unspecified, uncomplicated: Secondary | ICD-10-CM | POA: Insufficient documentation

## 2015-11-09 DIAGNOSIS — F329 Major depressive disorder, single episode, unspecified: Secondary | ICD-10-CM | POA: Insufficient documentation

## 2015-11-09 NOTE — Patient Instructions (Signed)
Generalized Anxiety Disorder Generalized anxiety disorder (GAD) is a mental disorder. It interferes with life functions, including relationships, work, and school. GAD is different from normal anxiety, which everyone experiences at some point in their lives in response to specific life events and activities. Normal anxiety actually helps [place] prepare for and get through these life events and activities. Normal anxiety goes away after the event or activity is over.  GAD causes anxiety that is not necessarily related to specific events or activities. It also causes excess anxiety in proportion to specific events or activities. The anxiety associated with GAD is also difficult to control. GAD can vary from mild to severe. People with severe GAD can have intense waves of anxiety with physical symptoms (panic attacks).  SYMPTOMS The anxiety and worry associated with GAD are difficult to control. This anxiety and worry are related to many life events and activities and also occur more days than not for 6 months or longer. People with GAD also have three or more of the following symptoms (one or more in children):  Restlessness.   Fatigue.  Difficulty concentrating.   Irritability.  Muscle tension.  Difficulty sleeping or unsatisfying sleep. DIAGNOSIS GAD is diagnosed through an assessment by your health care provider. Your health care provider will ask you questions aboutyour mood,physical symptoms, and events in your life. Your health care provider may ask you about your medical history and use of alcohol or drugs, including prescription medicines. Your health care provider may also do a physical exam and blood tests. Certain medical conditions and the use of certain substances can cause symptoms similar to those associated with GAD. Your health care provider may refer you to a mental health specialist for further evaluation. TREATMENT The following therapies are usually used to treat GAD:    Medication. Antidepressant medication usually is prescribed for long-term daily control. Antianxiety medicines may be added in severe cases, especially when panic attacks occur.   Talk therapy (psychotherapy). Certain types of talk therapy can be helpful in treating GAD by providing support, education, and guidance. A form of talk therapy called cognitive behavioral therapy can teach you healthy ways to think about and react to daily life events and activities.  Stress managementtechniques. These include yoga, meditation, and exercise and can be very helpful when they are practiced regularly. A mental health specialist can help determine which treatment is best for you. Some people see improvement with one therapy. However, other people require a combination of therapies.   This information is not intended to replace advice given to you by your health care provider. Make sure you discuss any questions you have with your health care provider.   Document Released: 10/04/2012 Document Revised: 06/30/2014 Document Reviewed: 10/04/2012 Elsevier Interactive Patient Education 2016 [employer]. Suicidal Feelings: How to Help Yourself Suicide is the taking of one's own life. If you feel as though life is getting too tough to handle and are thinking about suicide, get help right away. To get help:  Call your local emergency services (911 in the [place]  Call a suicide hotline to speak with a trained counselor who understands how you are feeling. The following is a list of suicide hotlines in the [place]. For a list of hotlines in [place], visit FindSkins.pl.  1-800-273-TALK 7474743600).  1-800-SUICIDE 970-632-2132).  470-721-5819. This is a hotline for Spanish speakers.  E7576207 406-007-7843). This is a hotline for TTY users.  1-866-4-U-TREVOR 602-690-2018). This is a hotline for lesbian, gay, bisexual,  transgender, or  questioning youth.  Contact a crisis center or a local suicide prevention center. To find a crisis center or suicide prevention center:  Call your local hospital, clinic, community service organization, mental health center, social service provider, or health department. Ask for assistance in connecting to a crisis center.  Visit BankingRep.com.au for a list of crisis centers in the [place], or visit www.suicideprevention.ca/thinking-about-suicide/find-a-crisis-centre for a list of centers in [place].  Visit the following websites:  National Suicide Prevention Lifeline: www.suicidepreventionlifeline.org  Hopeline: www.hopeline.[place] for Suicide Prevention: PromotionalLoans.co.za  The [employer] (for lesbian, gay, bisexual, transgender, or questioning youth): www.thetrevorproject.org HOW CAN I HELP MYSELF FEEL BETTER?  Promise yourself that you will not do anything drastic when you have suicidal feelings. Remember, there is hope. Many people have gotten through suicidal thoughts and feelings, and you will, too. You may have gotten through them before, and this proves that you can get through them again.  Let family, friends, teachers, or counselors know how you are feeling. Try not to isolate yourself from those who care about you. Remember, they will want to help you. Talk with someone every day, even if you do not feel sociable. Face-to-face conversation is best.  Call a mental health professional and see one regularly.  Visit your primary health care provider every year.  Eat a well-balanced diet, and space your meals so you eat regularly.  Get plenty of rest.  Avoid alcohol and drugs, and remove them from your home. They will only make you feel worse.  If you are thinking of taking a lot of medicine, give your medicine to someone who can give it to you one day at a time. If you are on antidepressants and are  concerned you will overdose, let your health care provider know so he or she can give you safer medicines. Ask your mental health professional about the possible side effects of any medicines you are taking.  Remove weapons, poisons, knives, and anything else that could harm you from your home.  Try to stick to routines. Follow a schedule every day. Put self-care on your schedule.  Make a list of realistic goals, and cross them off when you achieve them. Accomplishments give a sense of worth.  Wait until you are feeling better before doing the things you find difficult or unpleasant.  Exercise if you are able. You will feel better if you exercise for even a half hour each day.  Go out in the sun or into nature. This will help you recover from depression faster. If you have a favorite place to walk, go there.  Do the things that have always given you pleasure. Play your favorite music, read a good book, paint a picture, play your favorite instrument, or do anything else that takes your mind off your depression if it is safe to do.  Keep your living space well lit.  When you are feeling well, write yourself a letter about tips and support that you can read when you are not feeling well.  Remember that life's difficulties can be sorted out with help. Conditions can be treated. You can work on thoughts and strategies that serve you well.   This information is not intended to replace advice given to you by your health care provider. Make sure you discuss any questions you have with your health care provider.   Document Released: 12/14/2002 Document Revised: 06/30/2014 Document Reviewed: 10/04/2013 Elsevier Interactive Patient Education [employer].

## 2015-12-10 ENCOUNTER — Encounter: Payer: Self-pay | Admitting: Family Medicine

## 2015-12-10 ENCOUNTER — Ambulatory Visit (INDEPENDENT_AMBULATORY_CARE_PROVIDER_SITE_OTHER): Payer: Self-pay | Admitting: Family Medicine

## 2015-12-10 VITALS — BP 121/87 | HR 90 | Temp 98.7°F | Resp 16 | Ht 68.0 in | Wt 215.0 lb

## 2015-12-10 DIAGNOSIS — K581 Irritable bowel syndrome with constipation: Secondary | ICD-10-CM

## 2015-12-10 DIAGNOSIS — F32A Depression, unspecified: Secondary | ICD-10-CM

## 2015-12-10 DIAGNOSIS — F329 Major depressive disorder, single episode, unspecified: Secondary | ICD-10-CM

## 2015-12-10 DIAGNOSIS — F411 Generalized anxiety disorder: Secondary | ICD-10-CM

## 2015-12-10 MED ORDER — BUSPIRONE HCL 5 MG PO TABS: 5.0000 mg | ORAL_TABLET | Freq: Two times a day (BID) | ORAL | Status: DC

## 2015-12-10 MED ORDER — LUBIPROSTONE 8 MCG PO CAPS: 8.0000 ug | ORAL_CAPSULE | Freq: Every day | ORAL | Status: DC

## 2015-12-10 NOTE — Patient Instructions (Signed)

## 2015-12-10 NOTE — Progress Notes (Signed)
Subjective:    Patient ID: Alex Charles, male    DOB: September 10, 1965, 50 y.o.   MRN: [id number]  Depression      The patient presents with depression.  This is a recurrent problem.  The current episode started in the past 7 days.   The onset quality is gradual.   The problem occurs constantly.  The problem has been gradually worsening since onset.  Associated symptoms include decreased concentration, hopelessness, irritable, decreased interest and sad.  Associated symptoms include no fatigue, no helplessness, does not have insomnia, no restlessness, no appetite change, no myalgias and no headaches.Suicidal ideas: Patient does not have a plan.     The symptoms are aggravated by family issues and work stress.  Past treatments include nothing.  Risk factors include stress.   Past medical history includes anxiety and depression.   Anxiety Presents for initial visit. Onset was in the past 7 days. The problem has been gradually worsening. Symptoms include decreased concentration, depressed mood, excessive worry, irritability and nervous/anxious behavior. Patient reports no chest pain, dizziness, dry mouth, insomnia, nausea, restlessness or shortness of breath. Suicidal ideas: Patient does not have a plan. Symptoms occur most days. The severity of symptoms is moderate. Nothing aggravates the symptoms.   His past medical history is significant for depression.  Constipation This is a recurrent problem. The current episode started more than 1 year ago. The problem is unchanged. His stool frequency is 2 to 3 times per week. The stool is described as firm. The patient is on a high fiber diet. He does not exercise regularly. There has not been adequate water intake. Associated symptoms include back pain, bloating and vomiting. Pertinent negatives include no abdominal pain, anorexia, diarrhea, difficulty urinating, fecal incontinence, fever, flatus, hematochezia, hemorrhoids, melena, nausea, rectal pain or weight loss.  Risk factors include dietary change. He has tried stool softeners, laxatives, fiber and diet changes for the symptoms. The treatment provided mild relief.   Past Medical History  Diagnosis Date  . Prediabetes   . Hyperlipidemia   . CHF (congestive heart failure) ([place])    Social History   Social History  . Marital Status: Single    Spouse Name: N/A  . Number of Children: N/A  . Years of Education: N/A   Occupational History  . Not on file.   Social History Main Topics  . Smoking status: Current Every Day Smoker -- 0.50 packs/day    Types: Cigarettes  . Smokeless tobacco: Never Used  . Alcohol Use: No  . Drug Use: No  . Sexual Activity: Not on file   Other Topics Concern  . Not on file   Social History Narrative   Past Surgical History  Procedure Laterality Date  . Coronary angioplasty with stent placement    . Cardiac valve replacement      Review of Systems  Constitutional: Positive for irritability. Negative for fever, weight loss, appetite change and fatigue.  HENT: Negative.   Eyes: Negative.  Negative for photophobia and visual disturbance.  Respiratory: Negative.  Negative for shortness of breath.   Cardiovascular: Negative.  Negative for chest pain.  Gastrointestinal: Positive for vomiting, constipation and bloating. Negative for nausea, abdominal pain, diarrhea, melena, hematochezia, rectal pain, anorexia, flatus and hemorrhoids.  Endocrine: Negative.   Genitourinary: Negative.  Negative for difficulty urinating.  Musculoskeletal: Positive for back pain. Negative for myalgias.  Skin: Negative.   Allergic/Immunologic: Negative.   Neurological: Negative for dizziness and headaches.  Hematological: Negative.   Psychiatric/Behavioral:  Positive for depression and decreased concentration. Suicidal ideas: Patient does not have a plan. The patient is nervous/anxious. The patient does not have insomnia.        Symptoms of depression have improved on Buspar 5 mg BID       Objective:   Physical Exam  Constitutional: He is oriented to person, place, and time. He appears well-developed and well-nourished. He is irritable.  HENT:  Head: Normocephalic and atraumatic.  Right Ear: External ear normal.  Left Ear: External ear normal.  Nose: Nose normal.  Mouth/Throat: Oropharynx is clear and moist.  Eyes: Conjunctivae and EOM are normal. Pupils are equal, round, and reactive to light.  Neck: Normal range of motion. Neck supple.  Cardiovascular: Normal rate, regular rhythm, normal heart sounds and intact distal pulses.   Pulmonary/Chest: Effort normal and breath sounds normal.  Abdominal: Soft. Bowel sounds are normal.  Musculoskeletal: Normal range of motion.  Neurological: He is alert and oriented to person, place, and time.  Skin: Skin is warm and dry.  Psychiatric: His behavior is normal. Judgment and thought content normal. His mood appears anxious. His speech is not rapid and/or pressured. He is not agitated and not aggressive. He exhibits a depressed mood. He expresses no homicidal and no suicidal ideation. He expresses no suicidal plans and no homicidal plans.      BP 121/87 mmHg  Pulse 90  Temp(Src) 98.7 F (37.1 C) (Oral)  Resp 16  Ht 5\' 8"  (1.727 m)  Wt 215 lb (97.523 kg)  BMI 32.70 kg/m2  SpO2 98% Assessment & Plan:  1. Depression - busPIRone (BUSPAR) 5 MG tablet; Take 1 tablet (5 mg total) by mouth 2 (two) times daily.  Dispense: 60 tablet; Refill: 11 - Ambulatory referral to Psychology Depression screen [facility] 2/9 12/10/2015 11/08/2015 08/01/2015 07/18/2015 07/04/2015  Decreased Interest 1 3 0 0 1  Down, Depressed, Hopeless 1 2 0 0 1  PHQ - 2 Score 2 5 0 0 2  Altered sleeping 1 3 - - 3  Tired, decreased energy 1 3 - - 3  Change in appetite 0 3 - - 3  Feeling bad or failure about yourself  1 3 - - 2  Trouble concentrating 1 1 - - 0  Moving slowly or fidgety/restless 0 2 - - 1  Suicidal thoughts - 3 - - 2  PHQ-9 Score 6 23 - - 16   Difficult doing work/chores Not difficult at all Somewhat difficult - - Not difficult at all     2. Generalized anxiety disorder - busPIRone (BUSPAR) 5 MG tablet; Take 1 tablet (5 mg total) by mouth 2 (two) times daily.  Dispense: 60 tablet; Refill: 11 - Ambulatory referral to Psychology GAD 7 : Generalized Anxiety Score 12/10/2015 11/08/2015  Nervous, Anxious, on Edge 1 2  Control/stop worrying 1 1  Worry too much - different things 0 2  Trouble relaxing 1 2  Restless 1 1  Easily annoyed or irritable 1 2  Afraid - awful might happen 1 2  Total GAD 7 Score 6 12  Anxiety Difficulty Not difficult at all Somewhat difficult    3. Irritable bowel syndrome with constipation Will start a trial of Amitiza. Will follow up with Mr. Nearhood by phone in 1 week.  - lubiprostone (AMITIZA) 8 MCG capsule; Take 1 capsule (8 mcg total) by mouth daily with breakfast.  Dispense: 30 capsule; Refill: 0  RTC: 1 month for depression/anxiety Amerika Nourse M, FNP

## 2016-01-28 ENCOUNTER — Ambulatory Visit (INDEPENDENT_AMBULATORY_CARE_PROVIDER_SITE_OTHER): Payer: Self-pay | Admitting: Family Medicine

## 2016-01-28 ENCOUNTER — Encounter: Payer: Self-pay | Admitting: Family Medicine

## 2016-01-28 VITALS — BP 139/91 | HR 77 | Temp 98.0°F | Ht 68.5 in | Wt 211.0 lb

## 2016-01-28 DIAGNOSIS — Z1159 Encounter for screening for other viral diseases: Secondary | ICD-10-CM

## 2016-01-28 DIAGNOSIS — Z114 Encounter for screening for human immunodeficiency virus [HIV]: Secondary | ICD-10-CM

## 2016-01-28 DIAGNOSIS — Z125 Encounter for screening for malignant neoplasm of prostate: Secondary | ICD-10-CM

## 2016-01-28 DIAGNOSIS — I10 Essential (primary) hypertension: Secondary | ICD-10-CM

## 2016-01-28 LAB — COMPREHENSIVE METABOLIC PANEL
ALBUMIN: 4.4 g/dL (ref 3.6–5.1)
ALT: 36 U/L (ref 9–46)
AST: 33 U/L (ref 10–35)
Alkaline Phosphatase: 75 U/L (ref 40–115)
BILIRUBIN TOTAL: 0.5 mg/dL (ref 0.2–1.2)
BUN: 16 mg/dL (ref 7–25)
CO2: 21 mmol/L (ref 20–31)
CREATININE: 0.93 mg/dL (ref 0.70–1.33)
Calcium: 10.1 mg/dL (ref 8.6–10.3)
Chloride: 105 mmol/L (ref 98–110)
GLUCOSE: 107 mg/dL — AB (ref 65–99)
Potassium: 4.6 mmol/L (ref 3.5–5.3)
SODIUM: 138 mmol/L (ref 135–146)
Total Protein: 6.6 g/dL (ref 6.1–8.1)

## 2016-01-28 LAB — LIPID PANEL
Cholesterol: 238 mg/dL — ABNORMAL HIGH (ref 125–200)
HDL: 36 mg/dL — ABNORMAL LOW (ref >=40)
LDL Cholesterol: 142 mg/dL — ABNORMAL HIGH (ref <130)
Total CHOL/HDL Ratio: 6.6 Ratio — ABNORMAL HIGH (ref <=5.0)
Triglycerides: 299 mg/dL — ABNORMAL HIGH (ref <150)
VLDL: 60 mg/dL — ABNORMAL HIGH (ref <30)

## 2016-01-28 LAB — CBC WITH DIFFERENTIAL/PLATELET
BASOS ABS: 72 {cells}/uL (ref 0–200)
Basophils Relative: 1 %
EOS ABS: 72 {cells}/uL (ref 15–500)
Eosinophils Relative: 1 %
HCT: 50.1 % — ABNORMAL HIGH (ref 38.5–50.0)
Hemoglobin: 17.7 g/dL — ABNORMAL HIGH (ref 13.2–17.1)
LYMPHS PCT: 42 %
Lymphs Abs: 3024 cells/uL (ref 850–3900)
MCH: 32.1 pg (ref 27.0–33.0)
MCHC: 35.3 g/dL (ref 32.0–36.0)
MCV: 90.9 fL (ref 80.0–100.0)
MONOS PCT: 7 %
MPV: 9.8 fL (ref 7.5–12.5)
Monocytes Absolute: 504 cells/uL (ref 200–950)
NEUTROS PCT: 49 %
Neutro Abs: 3528 cells/uL (ref 1500–7800)
PLATELETS: 201 10*3/uL (ref 140–400)
RBC: 5.51 MIL/uL (ref 4.20–5.80)
RDW: 14.5 % (ref 11.0–15.0)
WBC: 7.2 10*3/uL (ref 3.8–10.8)

## 2016-01-29 ENCOUNTER — Other Ambulatory Visit (HOSPITAL_COMMUNITY): Payer: Self-pay | Admitting: Family Medicine

## 2016-01-29 LAB — PSA: PSA: 0.7 ng/mL (ref <=4.0)

## 2016-01-29 LAB — HIV ANTIBODY (ROUTINE TESTING W REFLEX): HIV: NONREACTIVE

## 2016-01-29 LAB — HEPATITIS C ANTIBODY: HCV AB: NEGATIVE

## 2016-01-29 MED ORDER — PANTOPRAZOLE SODIUM 40 MG PO TBEC: 40.0000 mg | DELAYED_RELEASE_TABLET | Freq: Every day | ORAL | 1 refills | Status: DC

## 2016-01-29 MED ORDER — PRAVASTATIN SODIUM 40 MG PO TABS: 40.0000 mg | ORAL_TABLET | Freq: Every day | ORAL | 5 refills | Status: DC

## 2016-01-29 MED ORDER — OMEPRAZOLE 20 MG PO CPDR: 20.0000 mg | DELAYED_RELEASE_CAPSULE | Freq: Every day | ORAL | 1 refills | Status: DC

## 2016-01-29 NOTE — Progress Notes (Signed)
Called and spoke to patient, advised of labs and the need to follow low fat/ low cholesterol diet and start medication. Patient verbalized understanding. Thanks!

## 2016-02-05 ENCOUNTER — Encounter (HOSPITAL_COMMUNITY): Payer: Self-pay

## 2016-03-04 ENCOUNTER — Ambulatory Visit (INDEPENDENT_AMBULATORY_CARE_PROVIDER_SITE_OTHER): Payer: Self-pay | Admitting: Licensed Clinical Social Worker

## 2016-03-04 ENCOUNTER — Encounter (HOSPITAL_COMMUNITY): Payer: Self-pay | Admitting: Licensed Clinical Social Worker

## 2016-03-04 ENCOUNTER — Encounter (INDEPENDENT_AMBULATORY_CARE_PROVIDER_SITE_OTHER): Payer: Self-pay

## 2016-03-04 DIAGNOSIS — F322 Major depressive disorder, single episode, severe without psychotic features: Secondary | ICD-10-CM

## 2016-03-04 DIAGNOSIS — F411 Generalized anxiety disorder: Secondary | ICD-10-CM
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
## 2016-03-25 ENCOUNTER — Encounter (HOSPITAL_COMMUNITY): Payer: Self-pay

## 2016-03-25 ENCOUNTER — Ambulatory Visit (INDEPENDENT_AMBULATORY_CARE_PROVIDER_SITE_OTHER): Payer: Self-pay | Admitting: Licensed Clinical Social Worker

## 2016-03-25 ENCOUNTER — Ambulatory Visit (HOSPITAL_COMMUNITY): Payer: Self-pay | Admitting: Licensed Clinical Social Worker

## 2016-03-25 DIAGNOSIS — F1021 Alcohol dependence, in remission: Secondary | ICD-10-CM

## 2016-03-27 ENCOUNTER — Encounter (HOSPITAL_COMMUNITY): Payer: Self-pay | Admitting: Licensed Clinical Social Worker

## 2016-03-27 NOTE — Progress Notes (Signed)
Met with pt briefly to discuss aftercare program. Pt will begin tonight.   Jenkins Rouge, LCAS

## 2016-03-27 NOTE — Progress Notes (Signed)
Daily Group Progress Note Program:  Aftercare  Group Time: 5:15-6:15 pm  Participation Level: Active  Behavioral Response: Appropriate  Type of Therapy:  Psychoeducation/Therapy  Summary of Progress: This was pt's first group. He introduced himself to the group. Pt participated in a discussion on finding balance in recovery. Pt was encouraged to continue his self/care, family, friends, and job  Pt needs to balance in his life as becoming overwhelmed is a identified trigger to relapse. Pt participated well during the intervention.   Jenkins Rouge, LCAS-A

## 2016-04-01 ENCOUNTER — Ambulatory Visit (INDEPENDENT_AMBULATORY_CARE_PROVIDER_SITE_OTHER): Payer: Self-pay | Admitting: Licensed Clinical Social Worker

## 2016-04-01 DIAGNOSIS — F322 Major depressive disorder, single episode, severe without psychotic features: Secondary | ICD-10-CM

## 2016-04-01 DIAGNOSIS — F411 Generalized anxiety disorder: Secondary | ICD-10-CM

## 2016-04-01 DIAGNOSIS — F1021 Alcohol dependence, in remission: Secondary | ICD-10-CM

## 2016-04-02 ENCOUNTER — Encounter (HOSPITAL_COMMUNITY): Payer: Self-pay | Admitting: Licensed Clinical Social Worker

## 2016-04-02 NOTE — Progress Notes (Signed)
Daily Group Progress Note Program:  Aftercare  Group Time: 5:15-6:15 pm  Participation Level: Active  Behavioral Response: Appropriate  Type of Therapy:  Psychoeducation/Therapy  Summary of Progress: Pt participated in a discussion on "finding purpose in my life" after years of active addiction. Without alcohol or drugs to fill that void, pt described he needs something else to satisfy him spiritually, emotionally and mentally.  The recovery process itself sparks a passion in clients to inspire growth. Pt was encouraged to continue to work on his self-discovery which plays a role in his recovery process. Pt was actively engaged in the intervention.  Jenkins Rouge, LCAS

## 2016-04-08 ENCOUNTER — Ambulatory Visit (HOSPITAL_COMMUNITY): Payer: Self-pay | Admitting: Licensed Clinical Social Worker

## 2016-04-14 ENCOUNTER — Ambulatory Visit (HOSPITAL_COMMUNITY): Payer: Self-pay | Admitting: Licensed Clinical Social Worker

## 2016-04-14 ENCOUNTER — Encounter (HOSPITAL_COMMUNITY): Payer: Self-pay

## 2016-04-15 ENCOUNTER — Ambulatory Visit (HOSPITAL_COMMUNITY): Payer: Self-pay | Admitting: Licensed Clinical Social Worker

## 2016-04-22 ENCOUNTER — Ambulatory Visit (HOSPITAL_COMMUNITY): Payer: Self-pay | Admitting: Licensed Clinical Social Worker

## 2016-04-29 ENCOUNTER — Ambulatory Visit (INDEPENDENT_AMBULATORY_CARE_PROVIDER_SITE_OTHER): Payer: Self-pay | Admitting: Licensed Clinical Social Worker

## 2016-04-29 DIAGNOSIS — F322 Major depressive disorder, single episode, severe without psychotic features: Secondary | ICD-10-CM

## 2016-04-29 DIAGNOSIS — F1021 Alcohol dependence, in remission: Secondary | ICD-10-CM

## 2016-04-30 ENCOUNTER — Encounter (HOSPITAL_COMMUNITY): Payer: Self-pay | Admitting: Licensed Clinical Social Worker

## 2016-04-30 NOTE — Progress Notes (Signed)
Daily Group Progress Note  Program:  Aftercare  Group Time: 5:15-6:15 pm  Participation Level: Active  Behavioral Response: Appropriate  Type of Therapy:  Psychoeducation/Therapy  Summary of Progress: Pt participated in a discussion on fear, a normal part of recovery. Pt agreed that the process of getting sober means having to replace his primary coping mechanism (alcohol) with new uncomfortable coping mechanisms. 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 Pt has no desire to go to [employer], get a sponsor or work steps. Pt and counselor discussed referring pt back to individual therapy. Pt was in agreement with this referral   Jenkins Rouge, LCAS

## 2016-05-06 ENCOUNTER — Ambulatory Visit (HOSPITAL_COMMUNITY): Payer: Self-pay | Admitting: Licensed Clinical Social Worker

## 2016-05-13 ENCOUNTER — Ambulatory Visit (HOSPITAL_COMMUNITY): Payer: Self-pay | Admitting: Licensed Clinical Social Worker

## 2016-05-20 ENCOUNTER — Ambulatory Visit (HOSPITAL_COMMUNITY): Payer: Self-pay | Admitting: Licensed Clinical Social Worker

## 2016-05-27 ENCOUNTER — Ambulatory Visit (HOSPITAL_COMMUNITY): Payer: Self-pay | Admitting: Licensed Clinical Social Worker

## 2016-06-03 ENCOUNTER — Ambulatory Visit (HOSPITAL_COMMUNITY): Payer: Self-pay | Admitting: Licensed Clinical Social Worker

## 2016-06-10 ENCOUNTER — Ambulatory Visit (HOSPITAL_COMMUNITY): Payer: Self-pay | Admitting: Licensed Clinical Social Worker

## 2016-06-17 ENCOUNTER — Ambulatory Visit (HOSPITAL_COMMUNITY): Payer: Self-pay | Admitting: Licensed Clinical Social Worker

## 2016-07-31 ENCOUNTER — Ambulatory Visit: Payer: Self-pay | Admitting: Family Medicine

## 2016-08-14 ENCOUNTER — Other Ambulatory Visit (INDEPENDENT_AMBULATORY_CARE_PROVIDER_SITE_OTHER): Payer: Self-pay

## 2016-08-14 ENCOUNTER — Other Ambulatory Visit: Payer: Self-pay | Admitting: Family Medicine

## 2016-08-14 DIAGNOSIS — K760 Fatty (change of) liver, not elsewhere classified: Secondary | ICD-10-CM

## 2016-08-14 DIAGNOSIS — E785 Hyperlipidemia, unspecified: Secondary | ICD-10-CM

## 2016-08-14 DIAGNOSIS — R7303 Prediabetes: Secondary | ICD-10-CM

## 2016-08-14 LAB — COMPLETE METABOLIC PANEL WITH GFR
ALT: 31 U/L (ref 9–46)
AST: 34 U/L (ref 10–35)
Albumin: 4.5 g/dL (ref 3.6–5.1)
Alkaline Phosphatase: 64 U/L (ref 40–115)
BUN: 11 mg/dL (ref 7–25)
CALCIUM: 9.9 mg/dL (ref 8.6–10.3)
CO2: 23 mmol/L (ref 20–31)
Chloride: 104 mmol/L (ref 98–110)
Creat: 0.97 mg/dL (ref 0.70–1.33)
GFR, EST NON AFRICAN AMERICAN: 89 mL/min (ref >=60)
Glucose, Bld: 128 mg/dL — ABNORMAL HIGH (ref 65–99)
POTASSIUM: 4.5 mmol/L (ref 3.5–5.3)
Sodium: 137 mmol/L (ref 135–146)
Total Bilirubin: 0.4 mg/dL (ref 0.2–1.2)
Total Protein: 6.9 g/dL (ref 6.1–8.1)

## 2016-08-14 LAB — LIPID PANEL
Cholesterol: 232 mg/dL — ABNORMAL HIGH (ref <200)
HDL: 39 mg/dL — AB (ref >40)
LDL CALC: 119 mg/dL — AB (ref <100)
TRIGLYCERIDES: 372 mg/dL — AB (ref <150)
Total CHOL/HDL Ratio: 5.9 Ratio — ABNORMAL HIGH (ref <5.0)
VLDL: 74 mg/dL — ABNORMAL HIGH (ref <30)

## 2016-08-15 ENCOUNTER — Other Ambulatory Visit: Payer: Self-pay | Admitting: Family Medicine

## 2016-09-04 ENCOUNTER — Ambulatory Visit (INDEPENDENT_AMBULATORY_CARE_PROVIDER_SITE_OTHER): Payer: Self-pay | Admitting: Family Medicine

## 2016-09-04 ENCOUNTER — Encounter: Payer: Self-pay | Admitting: Family Medicine

## 2016-09-04 VITALS — BP 127/82 | HR 89 | Temp 98.1°F | Resp 16 | Ht 68.5 in | Wt 215.0 lb

## 2016-09-04 DIAGNOSIS — F32A Depression, unspecified: Secondary | ICD-10-CM

## 2016-09-04 DIAGNOSIS — F329 Major depressive disorder, single episode, unspecified: Secondary | ICD-10-CM

## 2016-09-04 DIAGNOSIS — E784 Other hyperlipidemia: Secondary | ICD-10-CM

## 2016-09-04 DIAGNOSIS — E7849 Other hyperlipidemia: Secondary | ICD-10-CM

## 2016-09-04 DIAGNOSIS — F411 Generalized anxiety disorder: Secondary | ICD-10-CM

## 2016-09-04 MED ORDER — BUSPIRONE HCL 10 MG PO TABS: 10.0000 mg | ORAL_TABLET | Freq: Two times a day (BID) | ORAL | 5 refills | Status: DC

## 2016-09-04 MED ORDER — ASPIRIN EC 81 MG PO TBEC: 81.0000 mg | DELAYED_RELEASE_TABLET | Freq: Every day | ORAL | 11 refills | Status: AC

## 2016-09-04 MED ORDER — PRAVASTATIN SODIUM 40 MG PO TABS: 40.0000 mg | ORAL_TABLET | Freq: Every day | ORAL | 5 refills | Status: DC

## 2016-09-04 NOTE — Progress Notes (Signed)
Subjective:    Patient ID: Alex Charles, male    DOB: July 02, 1965, 51 y.o.   MRN: [id number]  Depression       The patient presents with depression.  This is a recurrent problem.  The current episode started in the past 7 days.   The onset quality is gradual.   The problem occurs constantly.  The problem has been gradually worsening since onset.  Associated symptoms include decreased concentration, hopelessness, irritable, decreased interest and sad.  Associated symptoms include no fatigue, no helplessness, does not have insomnia, no restlessness, no appetite change, no myalgias and no headaches.Suicidal ideas: Patient does not have a plan.     The symptoms are aggravated by family issues and work stress.  Past treatments include nothing.  Risk factors include stress.   Past medical history includes anxiety and depression.   Anxiety  Presents for initial visit. Onset was in the past 7 days. The problem has been gradually worsening. Symptoms include decreased concentration, depressed mood, excessive worry, irritability and nervous/anxious behavior. Patient reports no dizziness, dry mouth, insomnia, restlessness or shortness of breath. Suicidal ideas: Patient does not have a plan. Symptoms occur most days. The severity of symptoms is moderate. Nothing aggravates the symptoms.   His past medical history is significant for depression.  Hyperlipidemia  Pertinent negatives include no myalgias or shortness of breath.   Past Medical History:  Diagnosis Date  . CHF (congestive heart failure) ([place])   . Hyperlipidemia   . Prediabetes    Social History   Social History  . Marital status: Single    Spouse name: N/A  . Number of children: N/A  . Years of education: N/A   Occupational History  . Not on file.   Social History Main Topics  . Smoking status: Current Every Day Smoker    Packs/day: 0.50    Types: Cigarettes  . Smokeless tobacco: Never Used  . Alcohol use No  . Drug use: No  .  Sexual activity: Not on file   Other Topics Concern  . Not on file   Social History Narrative  . No narrative on file   Past Surgical History:  Procedure Laterality Date  . CARDIAC VALVE REPLACEMENT    . CORONARY ANGIOPLASTY WITH STENT PLACEMENT      Review of Systems  Constitutional: Positive for irritability. Negative for appetite change and fatigue.  HENT: Negative.   Eyes: Negative.  Negative for photophobia and visual disturbance.  Respiratory: Negative.  Negative for shortness of breath.   Cardiovascular: Negative.   Endocrine: Negative.   Genitourinary: Negative.   Musculoskeletal: Negative for myalgias.  Skin: Negative.   Allergic/Immunologic: Negative.   Neurological: Negative for dizziness and headaches.  Hematological: Negative.   Psychiatric/Behavioral: Positive for decreased concentration and depression. Suicidal ideas: Patient does not have a plan. The patient is nervous/anxious. The patient does not have insomnia.        Symptoms of depression have improved on Buspar 5 mg BID      Objective:   Physical Exam  Constitutional: He is oriented to person, place, and time. He appears well-developed and well-nourished. He is irritable.  HENT:  Head: Normocephalic and atraumatic.  Right Ear: External ear normal.  Left Ear: External ear normal.  Nose: Nose normal.  Mouth/Throat: Oropharynx is clear and moist.  Eyes: Conjunctivae and EOM are normal. Pupils are equal, round, and reactive to light.  Neck: Normal range of motion. Neck supple.  Cardiovascular: Normal rate, regular rhythm,  normal heart sounds and intact distal pulses.   Pulmonary/Chest: Effort normal and breath sounds normal.  Abdominal: Soft. Bowel sounds are normal.  Musculoskeletal: Normal range of motion.  Neurological: He is alert and oriented to person, place, and time.  Skin: Skin is warm and dry.  Psychiatric: His behavior is normal. Judgment and thought content normal. His mood appears anxious.  His speech is not rapid and/or pressured. He is not agitated and not aggressive. He exhibits a depressed mood. He expresses no homicidal and no suicidal ideation. He expresses no suicidal plans and no homicidal plans.      BP 127/82 (BP Location: Right Arm, Patient Position: Sitting, Cuff Size: Large)   Pulse 89   Temp 98.1 F (36.7 C) (Oral)   Resp 16   Ht 5' 8.5" (1.74 m)   Wt 215 lb (97.5 kg)   SpO2 98%   BMI 32.22 kg/m  Assessment & Plan:  1. Depression, unspecified depression type Depression screen [facility] 2/9 09/04/2016 01/28/2016 12/10/2015 11/08/2015 08/01/2015  Decreased Interest 1 0 1 3 0  Down, Depressed, Hopeless 0 1 1 2  0  PHQ - 2 Score 1 1 2 5  0  Altered sleeping - - 1 3 -  Tired, decreased energy - - 1 3 -  Change in appetite - - 0 3 -  Feeling bad or failure about yourself  - - 1 3 -  Trouble concentrating - - 1 1 -  Moving slowly or fidgety/restless - - 0 2 -  Suicidal thoughts - - - 3 -  PHQ-9 Score - - 6 23 -  Difficult doing work/chores - - Not difficult at all Somewhat difficult -   - busPIRone (BUSPAR) 10 MG tablet; Take 1 tablet (10 mg total) by mouth 2 (two) times daily.  Dispense: 60 tablet; Refill: 5  2. Generalized anxiety disorder - busPIRone (BUSPAR) 10 MG tablet; Take 1 tablet (10 mg total) by mouth 2 (two) times daily.  Dispense: 60 tablet; Refill: 5  3. Other hyperlipidemia The patient is asked to make an attempt to improve diet and exercise patterns to aid in medical management of this problem. Discussed diet and exercise at length. Recommend a lowfat, low carbohydrate diet divided over 5-6 small meals, increase water intake to 6-8 glasses, and 150 minutes per week of cardiovascular exercise.   - aspirin EC 81 MG tablet; Take 1 tablet (81 mg total) by mouth daily.  Dispense: 30 tablet; Refill: 11 - pravastatin (PRAVACHOL) 40 MG tablet; Take 1 tablet (40 mg total) by mouth daily.  Dispense: 30 tablet; Refill: 5    RTC: 6 months for  hyperlipidemia   Dorena Dew, FNP

## 2017-01-13 ENCOUNTER — Other Ambulatory Visit: Payer: Self-pay

## 2017-01-13 MED ORDER — OMEPRAZOLE 20 MG PO CPDR: 20.0000 mg | DELAYED_RELEASE_CAPSULE | Freq: Every day | ORAL | 1 refills | Status: DC

## 2017-01-13 NOTE — Telephone Encounter (Signed)
Refill for omeprazole sent into pharmacy. Thanks ! 

## 2017-01-20 ENCOUNTER — Other Ambulatory Visit: Payer: Self-pay

## 2017-01-20 MED ORDER — OMEPRAZOLE 20 MG PO CPDR: 20.0000 mg | DELAYED_RELEASE_CAPSULE | Freq: Every day | ORAL | 1 refills | Status: DC

## 2017-01-20 NOTE — Telephone Encounter (Signed)
Refill for omeprazole sent into pharmacy. Thanks ! 

## 2017-03-09 ENCOUNTER — Ambulatory Visit: Payer: Self-pay | Admitting: Family Medicine

## 2017-03-23 ENCOUNTER — Other Ambulatory Visit: Payer: Self-pay

## 2017-03-23 DIAGNOSIS — F32A Depression, unspecified: Secondary | ICD-10-CM

## 2017-03-23 DIAGNOSIS — F329 Major depressive disorder, single episode, unspecified: Secondary | ICD-10-CM

## 2017-03-23 DIAGNOSIS — F411 Generalized anxiety disorder: Secondary | ICD-10-CM

## 2017-03-23 MED ORDER — BUSPIRONE HCL 10 MG PO TABS: 10.0000 mg | ORAL_TABLET | Freq: Two times a day (BID) | ORAL | 5 refills | Status: DC

## 2017-03-23 NOTE — Telephone Encounter (Signed)
Refill for buspirone sent into pharmacy. Thanks!

## 2017-04-08 ENCOUNTER — Encounter: Payer: Self-pay | Admitting: Family Medicine

## 2017-04-08 ENCOUNTER — Ambulatory Visit (INDEPENDENT_AMBULATORY_CARE_PROVIDER_SITE_OTHER): Payer: 59 | Admitting: Family Medicine

## 2017-04-08 VITALS — BP 128/87 | HR 88 | Temp 98.2°F | Resp 16 | Ht 68.5 in | Wt 212.0 lb

## 2017-04-08 DIAGNOSIS — E785 Hyperlipidemia, unspecified: Secondary | ICD-10-CM | POA: Diagnosis not present

## 2017-04-08 DIAGNOSIS — Z23 Encounter for immunization: Secondary | ICD-10-CM | POA: Diagnosis not present

## 2017-04-08 DIAGNOSIS — G629 Polyneuropathy, unspecified: Secondary | ICD-10-CM

## 2017-04-08 DIAGNOSIS — F17200 Nicotine dependence, unspecified, uncomplicated: Secondary | ICD-10-CM

## 2017-04-08 DIAGNOSIS — R7303 Prediabetes: Secondary | ICD-10-CM | POA: Diagnosis not present

## 2017-04-08 LAB — CBC
HEMATOCRIT: 51.4 % — AB (ref 38.5–50.0)
HEMOGLOBIN: 17.7 g/dL — AB (ref 13.2–17.1)
MCH: 31.7 pg (ref 27.0–33.0)
MCHC: 34.4 g/dL (ref 32.0–36.0)
MCV: 92.1 fL (ref 80.0–100.0)
MPV: 9.9 fL (ref 7.5–12.5)
Platelets: 225 10*3/uL (ref 140–400)
RBC: 5.58 10*6/uL (ref 4.20–5.80)
RDW: 13.5 % (ref 11.0–15.0)
WBC: 9.4 10*3/uL (ref 3.8–10.8)

## 2017-04-08 LAB — LIPID PANEL
CHOLESTEROL: 300 mg/dL — AB (ref <200)
HDL: 39 mg/dL — AB (ref >40)
Non-HDL Cholesterol (Calc): 261 mg/dL (calc) — ABNORMAL HIGH (ref <130)
TRIGLYCERIDES: 602 mg/dL — AB (ref <150)
Total CHOL/HDL Ratio: 7.7 (calc) — ABNORMAL HIGH (ref <5.0)

## 2017-04-08 LAB — POCT GLYCOSYLATED HEMOGLOBIN (HGB A1C): Hemoglobin A1C: 5.7

## 2017-04-08 MED ORDER — GABAPENTIN 100 MG PO CAPS: 100.0000 mg | ORAL_CAPSULE | Freq: Every day | ORAL | 3 refills | Status: DC

## 2017-04-08 NOTE — Progress Notes (Signed)
Subjective:    Alex Charles is a 51 y.o.  male with a history of dyslipidemia, prediabetes, and GAD presents complaining of numbness, tingling, and burning sensation to feet. Onset of symptoms was several months ago.  Symptoms are currently of moderate severity. Symptoms occur intermittently. Symptoms are progressing. He denies weakness, dizziness, upper extremity paresthesias, abnormal gait, polyuria, polydipsia, or polyphagia.  Past Medical History:  Diagnosis Date  . CHF (congestive heart failure) ([place])   . Hyperlipidemia   . Prediabetes    Social History   Social History  . Marital status: Single    Spouse name: N/A  . Number of children: N/A  . Years of education: N/A   Occupational History  . Not on file.   Social History Main Topics  . Smoking status: Current Every Day Smoker    Packs/day: 0.50    Types: Cigarettes  . Smokeless tobacco: Never Used  . Alcohol use No  . Drug use: No  . Sexual activity: Not on file   Other Topics Concern  . Not on file   Social History Narrative  . No narrative on file   Immunization History  Administered Date(s) Administered  . Influenza,inj,Quad PF,6+ Mos 04/02/2015  . Tdap 04/08/2017   Review of Systems  Constitutional: Negative.   HENT: Negative.   Eyes: Negative.   Respiratory: Negative.   Cardiovascular: Negative for chest pain and palpitations.  Gastrointestinal: Negative.   Genitourinary: Negative.   Musculoskeletal: Negative.   Skin: Negative.   Neurological: Positive for tingling. Negative for dizziness, focal weakness, seizures, loss of consciousness and headaches.  Endo/Heme/Allergies: Negative.   Psychiatric/Behavioral: Negative.     Objective:    BP 128/87 (BP Location: Left Arm, Patient Position: Sitting, Cuff Size: Large)   Pulse 88   Temp 98.2 F (36.8 C) (Oral)   Resp 16   Ht 5' 8.5" (1.74 m)   Wt 212 lb (96.2 kg)   SpO2 98%   BMI 31.77 kg/m   General Appearance:    Alert, cooperative, no  distress, appears stated age  Head:    Normocephalic, without obvious abnormality, atraumatic  Eyes:    PERRL, conjunctiva/corneas clear, EOM's intact, fundi    benign, both eyes       Ears:    Normal TM's and external ear canals, both ears  Nose:   Nares normal, septum midline, mucosa normal, no drainage    or sinus tenderness  Throat:   Lips, mucosa, and tongue normal; teeth and gums normal  Neck:   Supple, symmetrical, trachea midline, no adenopathy;       thyroid:  No enlargement/tenderness/nodules; no carotid   bruit or JVD  Back:     Symmetric, no curvature, ROM normal, no CVA tenderness  Lungs:     Clear to auscultation bilaterally, respirations unlabored  Chest wall:    No tenderness or deformity  Heart:    Regular rate and rhythm, S1 and S2 normal, no murmur, rub   or gallop  Abdomen:     Soft, non-tender, bowel sounds active all four quadrants,    no masses, no organomegaly  Extremities:   Extremities normal, atraumatic, no cyanosis or edema  Pulses:   2+ and symmetric all extremities  Skin:   Skin color, texture, turgor normal, no rashes or lesions  Lymph nodes:   Cervical, supraclavicular, and axillary nodes normal  Neurologic:   CNII-XII intact. Normal strength, sensation and reflexes      throughout  Assessment:   BP 128/87 (BP Location: Left Arm, Patient Position: Sitting, Cuff Size: Large)   Pulse 88   Temp 98.2 F (36.8 C) (Oral)   Resp 16   Ht 5' 8.5" (1.74 m)   Wt 212 lb (96.2 kg)   SpO2 98%   BMI 31.77 kg/m  Plan:  1. Neuropathy Will start a trial of gabapentin 100 mg HS. Will follow up in 1 month.  - gabapentin (NEURONTIN) 100 MG capsule; Take 1 capsule (100 mg total) by mouth at bedtime.  Dispense: 30 capsule; Refill: 3  2. Prediabetes Recommend a lowfat, low carbohydrate diet divided over 5-6 small meals, increase water intake to 6-8 glasses, and 150 minutes per week of cardiovascular exercise.   - Hemoglobin A1c - Lipid Panel - CBC - HgB  A1c  3. Hyperlipidemia LDL goal <100  - Lipid Panel  4. Tobacco dependence Smoking cessation instruction/counseling given:  counseled patient on the dangers of tobacco use, advised patient to stop smoking, and reviewed strategies to maximize success  5. Need for Tdap vaccination - Tdap vaccine greater than or equal to 7yo IM   RTC: 1 months for neuropathy   Donia Pounds  MSN, FNP-C Patient [facility] [address] [place], [place] [address] [phone]

## 2017-04-08 NOTE — Patient Instructions (Signed)
Will start a trial of Gabapentin 100 mg at bedtime for neuropathy.  Will follow up in 1 month Your hemoglobin a1C is 5.7, which is within a normal range.    Peripheral Neuropathy Peripheral neuropathy is a type of nerve damage. It affects nerves that carry signals between the spinal cord and other parts of the body. These are called peripheral nerves. With peripheral neuropathy, one nerve or a group of nerves may be damaged. What are the causes? Many things can damage peripheral nerves. For some people with peripheral neuropathy, the cause is unknown. Some causes include:  Diabetes. This is the most common cause of peripheral neuropathy.  Injury to a nerve.  Pressure or stress on a nerve that lasts a long time.  Too little vitamin B. Alcoholism can lead to this.  Infections.  Autoimmune diseases, such as multiple sclerosis and systemic lupus erythematosus.  Inherited nerve diseases.  Some medicines, such as cancer drugs.  Toxic substances, such as lead and mercury.  Too little blood flowing to the legs.  Kidney disease.  Thyroid disease.  What are the signs or symptoms? Different people have different symptoms. The symptoms you have will depend on which of your nerves is damaged. Common symptoms include:  Loss of feeling (numbness) in the feet and hands.  Tingling in the feet and hands.  Pain that burns.  Very sensitive skin.  Weakness.  Not being able to move a part of the body (paralysis).  Muscle twitching.  Clumsiness or poor coordination.  Loss of balance.  Not being able to control your bladder.  Feeling dizzy.  Sexual problems.  How is this diagnosed? Peripheral neuropathy is a symptom, not a disease. Finding the cause of peripheral neuropathy can be hard. To figure that out, your health care provider will take a medical history and do a physical exam. A neurological exam will also be done. This involves checking things affected by your brain,  spinal cord, and nerves (nervous system). For example, your health care provider will check your reflexes, how you move, and what you can feel. Other types of tests may also be ordered, such as:  Blood tests.  A test of the fluid in your spinal cord.  Imaging tests, such as CT scans or an MRI.  Electromyography (EMG). This test checks the nerves that control muscles.  Nerve conduction velocity tests. These tests check how fast messages pass through your nerves.  Nerve biopsy. A small piece of nerve is removed. It is then checked under a microscope.  How is this treated?  Medicine is often used to treat peripheral neuropathy. Medicines may include: ? Pain-relieving medicines. Prescription or over-the-counter medicine may be suggested. ? Antiseizure medicine. This may be used for pain. ? Antidepressants. These also may help ease pain from neuropathy. ? Lidocaine. This is a numbing medicine. You might wear a patch or be given a shot. ? Mexiletine. This medicine is typically used to help control irregular heart rhythms.  Surgery. Surgery may be needed to relieve pressure on a nerve or to destroy a nerve that is causing pain.  Physical therapy to help movement.  Assistive devices to help movement. Follow these instructions at home:  Only take over-the-counter or prescription medicines as directed by your health care provider. Follow the instructions carefully for any given medicines. Do not take any other medicines without first getting approval from your health care provider.  If you have diabetes, work closely with your health care provider to keep your blood  sugar under control.  If you have numbness in your feet: ? Check every day for signs of injury or infection. Watch for redness, warmth, and swelling. ? Wear padded socks and comfortable shoes. These help protect your feet.  Do not do things that put pressure on your damaged nerve.  Do not smoke. Smoking keeps blood from  getting to damaged nerves.  Avoid or limit alcohol. Too much alcohol can cause a lack of B vitamins. These vitamins are needed for healthy nerves.  Develop a good support system. Coping with peripheral neuropathy can be stressful. Talk to a mental health specialist or join a support group if you are struggling.  Follow up with your health care provider as directed. Contact a health care provider if:  You have new signs or symptoms of peripheral neuropathy.  You are struggling emotionally from dealing with peripheral neuropathy.  You have a fever. Get help right away if:  You have an injury or infection that is not healing.  You feel very dizzy or begin vomiting.  You have chest pain.  You have trouble breathing. This information is not intended to replace advice given to you by your health care provider. Make sure you discuss any questions you have with your health care provider. Document Released: 05/30/2002 Document Revised: 11/15/2015 Document Reviewed: 02/14/2013 Elsevier Interactive Patient Education  2017 [employer].

## 2017-04-09 ENCOUNTER — Other Ambulatory Visit: Payer: Self-pay | Admitting: Family Medicine

## 2017-04-09 DIAGNOSIS — E781 Pure hyperglyceridemia: Secondary | ICD-10-CM

## 2017-04-09 DIAGNOSIS — G629 Polyneuropathy, unspecified: Secondary | ICD-10-CM | POA: Insufficient documentation

## 2017-04-09 MED ORDER — FENOFIBRATE 145 MG PO TABS: 145.0000 mg | ORAL_TABLET | Freq: Every day | ORAL | 5 refills | Status: DC

## 2017-04-09 NOTE — Progress Notes (Signed)
Called, no answer. Left a message for patient to return call. Thanks!  

## 2017-04-09 NOTE — Progress Notes (Signed)
Reviewed labs, triglycerides have increased to 600. Will start the following medication regimen:   Meds ordered this encounter  Medications  . fenofibrate (TRICOR) 145 MG tablet    Sig: Take 1 tablet (145 mg total) by mouth daily.    Dispense:  30 tablet    Refill:  [place]  MSN, FNP-C Patient [facility] [address] [place], [place] [address] [phone]

## 2017-04-10 ENCOUNTER — Other Ambulatory Visit: Payer: Self-pay

## 2017-04-10 DIAGNOSIS — E781 Pure hyperglyceridemia: Secondary | ICD-10-CM

## 2017-04-10 MED ORDER — FENOFIBRATE 145 MG PO TABS: 145.0000 mg | ORAL_TABLET | Freq: Every day | ORAL | 5 refills | Status: DC

## 2017-04-10 NOTE — Telephone Encounter (Signed)
Sent in medication to correct pharmacy. Thanks!

## 2017-04-10 NOTE — Progress Notes (Signed)
Called and spoke with patient. Advised of elevated triglycerides and the need to stop pravastatin and start tricor 145mg  daily. Asked that patient focus on diet and exercise eating a low fat/ low carb diet over 5 to 6 small meals daily, drink 6 to 8 glasses of water daily, and exercise 150 minutes a week of cardio. Patient verbalized understanding. Thanks!

## 2017-05-11 ENCOUNTER — Encounter: Payer: Self-pay | Admitting: Family Medicine

## 2017-05-11 ENCOUNTER — Ambulatory Visit (INDEPENDENT_AMBULATORY_CARE_PROVIDER_SITE_OTHER): Payer: 59 | Admitting: Family Medicine

## 2017-05-11 DIAGNOSIS — G629 Polyneuropathy, unspecified: Secondary | ICD-10-CM | POA: Diagnosis not present

## 2017-05-11 MED ORDER — GABAPENTIN 300 MG PO CAPS: 300.0000 mg | ORAL_CAPSULE | Freq: Three times a day (TID) | ORAL | 0 refills | Status: DC

## 2017-05-11 NOTE — Progress Notes (Signed)
Subjective:    Alex Charles is a 51 y.o.  male with a history of dyslipidemia, prediabetes, and GAD presents complaining of numbness, tingling, and burning sensation to feet. Onset of symptoms was several months ago. Patient was started on a trial of gabapentin 100 mg HS 1 month ago with out relief. Symptoms are currently of moderate severity. Symptoms occur intermittently. Symptoms are progressing. He denies weakness, dizziness, upper extremity paresthesias, abnormal gait, polyuria, polydipsia, or polyphagia.  Past Medical History:  Diagnosis Date  . CHF (congestive heart failure) ([place])   . Hyperlipidemia   . Prediabetes    Social History   Socioeconomic History  . Marital status: Single    Spouse name: Not on file  . Number of children: Not on file  . Years of education: Not on file  . Highest education level: Not on file  Social Needs  . Financial resource strain: Not on file  . Food insecurity - worry: Not on file  . Food insecurity - inability: Not on file  . Transportation needs - medical: Not on file  . Transportation needs - non-medical: Not on file  Occupational History  . Not on file  Tobacco Use  . Smoking status: Current Every Day Smoker    Packs/day: 0.50    Types: Cigarettes  . Smokeless tobacco: Never Used  Substance and Sexual Activity  . Alcohol use: No  . Drug use: No  . Sexual activity: Not on file  Other Topics Concern  . Not on file  Social History Narrative  . Not on file   Immunization History  Administered Date(s) Administered  . Influenza,inj,Quad PF,6+ Mos 04/02/2015  . Tdap 04/08/2017   Review of Systems  Constitutional: Negative.   HENT: Negative.   Eyes: Negative.   Respiratory: Negative.   Cardiovascular: Negative for chest pain and palpitations.  Gastrointestinal: Negative.   Genitourinary: Negative.   Musculoskeletal: Negative.   Skin: Negative.   Neurological: Positive for tingling. Negative for dizziness, focal weakness,  seizures, loss of consciousness and headaches.  Endo/Heme/Allergies: Negative.   Psychiatric/Behavioral: Negative.     Objective:    BP 130/89 (BP Location: Left Arm, Patient Position: Sitting, Cuff Size: Normal)   Pulse 83   Temp 97.9 F (36.6 C) (Oral)   Resp 14   Ht 5' 8.5" (1.74 m)   Wt 207 lb (93.9 kg)   SpO2 100%   BMI 31.02 kg/m   General Appearance:    Alert, cooperative, no distress, appears stated age  Head:    Normocephalic, without obvious abnormality, atraumatic  Back:     Symmetric, no curvature, ROM normal, no CVA tenderness  Lungs:     Clear to auscultation bilaterally, respirations unlabored  Chest wall:    No tenderness or deformity  Heart:    Regular rate and rhythm, S1 and S2 normal, no murmur, rub   or gallop  Abdomen:     Soft, non-tender, bowel sounds active all four quadrants,    no masses, no organomegaly  Extremities:   Extremities normal, atraumatic, no cyanosis or edema  Pulses:   2+ and symmetric all extremities  Skin:   Skin color, texture, turgor normal, no rashes or lesions  Lymph nodes:   Cervical, supraclavicular, and axillary nodes normal  Neurologic:   CNII-XII intact. Normal strength, sensation and reflexes      throughout      Assessment:   BP 130/89 (BP Location: Left Arm, Patient Position: Sitting, Cuff Size: Normal)   Pulse  83   Temp 97.9 F (36.6 C) (Oral)   Resp 14   Ht 5' 8.5" (1.74 m)   Wt 207 lb (93.9 kg)   SpO2 100%   BMI 31.02 kg/m  Plan:  Neuropathy Will start a trial of gabapentin 300 mg three times per day. Will follow up in 2 weeks by phone to discuss medication at length.   - gabapentin (NEURONTIN) 300 MG capsule; Take 1 capsule (300 mg total) 3 (three) times daily by mouth.  Dispense: 90 capsule; Refill: 0   RTC: 3 months for chronic conditions   Donia Pounds  MSN, FNP-C Patient [facility] [address] [place], [place] [address] [phone]

## 2017-05-11 NOTE — Patient Instructions (Addendum)
Will follow up by phone in 2 weeks to discuss Gabapentin. Will increase Gabapentin to 300 mg three times per day.  Peripheral Neuropathy Peripheral neuropathy is a type of nerve damage. It affects nerves that carry signals between the spinal cord and other parts of the body. These are called peripheral nerves. With peripheral neuropathy, one nerve or a group of nerves may be damaged. What are the causes? Many things can damage peripheral nerves. For some people with peripheral neuropathy, the cause is unknown. Some causes include:  Diabetes. This is the most common cause of peripheral neuropathy.  Injury to a nerve.  Pressure or stress on a nerve that lasts a long time.  Too little vitamin B. Alcoholism can lead to this.  Infections.  Autoimmune diseases, such as multiple sclerosis and systemic lupus erythematosus.  Inherited nerve diseases.  Some medicines, such as cancer drugs.  Toxic substances, such as lead and mercury.  Too little blood flowing to the legs.  Kidney disease.  Thyroid disease.  What are the signs or symptoms? Different people have different symptoms. The symptoms you have will depend on which of your nerves is damaged. Common symptoms include:  Loss of feeling (numbness) in the feet and hands.  Tingling in the feet and hands.  Pain that burns.  Very sensitive skin.  Weakness.  Not being able to move a part of the body (paralysis).  Muscle twitching.  Clumsiness or poor coordination.  Loss of balance.  Not being able to control your bladder.  Feeling dizzy.  Sexual problems.  How is this diagnosed? Peripheral neuropathy is a symptom, not a disease. Finding the cause of peripheral neuropathy can be hard. To figure that out, your health care provider will take a medical history and do a physical exam. A neurological exam will also be done. This involves checking things affected by your brain, spinal cord, and nerves (nervous system). For  example, your health care provider will check your reflexes, how you move, and what you can feel. Other types of tests may also be ordered, such as:  Blood tests.  A test of the fluid in your spinal cord.  Imaging tests, such as CT scans or an MRI.  Electromyography (EMG). This test checks the nerves that control muscles.  Nerve conduction velocity tests. These tests check how fast messages pass through your nerves.  Nerve biopsy. A small piece of nerve is removed. It is then checked under a microscope.  How is this treated?  Medicine is often used to treat peripheral neuropathy. Medicines may include: ? Pain-relieving medicines. Prescription or over-the-counter medicine may be suggested. ? Antiseizure medicine. This may be used for pain. ? Antidepressants. These also may help ease pain from neuropathy. ? Lidocaine. This is a numbing medicine. You might wear a patch or be given a shot. ? Mexiletine. This medicine is typically used to help control irregular heart rhythms.  Surgery. Surgery may be needed to relieve pressure on a nerve or to destroy a nerve that is causing pain.  Physical therapy to help movement.  Assistive devices to help movement. Follow these instructions at home:  Only take over-the-counter or prescription medicines as directed by your health care provider. Follow the instructions carefully for any given medicines. Do not take any other medicines without first getting approval from your health care provider.  If you have diabetes, work closely with your health care provider to keep your blood sugar under control.  If you have numbness in your feet: ?  Check every day for signs of injury or infection. Watch for redness, warmth, and swelling. ? Wear padded socks and comfortable shoes. These help protect your feet.  Do not do things that put pressure on your damaged nerve.  Do not smoke. Smoking keeps blood from getting to damaged nerves.  Avoid or limit  alcohol. Too much alcohol can cause a lack of B vitamins. These vitamins are needed for healthy nerves.  Develop a good support system. Coping with peripheral neuropathy can be stressful. Talk to a mental health specialist or join a support group if you are struggling.  Follow up with your health care provider as directed. Contact a health care provider if:  You have new signs or symptoms of peripheral neuropathy.  You are struggling emotionally from dealing with peripheral neuropathy.  You have a fever. Get help right away if:  You have an injury or infection that is not healing.  You feel very dizzy or begin vomiting.  You have chest pain.  You have trouble breathing. This information is not intended to replace advice given to you by your health care provider. Make sure you discuss any questions you have with your health care provider. Document Released: 05/30/2002 Document Revised: 11/15/2015 Document Reviewed: 02/14/2013 Elsevier Interactive Patient Education  2017 [employer].

## 2017-05-21 ENCOUNTER — Other Ambulatory Visit: Payer: Self-pay

## 2017-05-21 ENCOUNTER — Telehealth: Payer: Self-pay

## 2017-05-21 DIAGNOSIS — G629 Polyneuropathy, unspecified: Secondary | ICD-10-CM

## 2017-05-21 MED ORDER — GABAPENTIN 300 MG PO CAPS: 300.0000 mg | ORAL_CAPSULE | Freq: Three times a day (TID) | ORAL | 3 refills | Status: DC

## 2017-05-21 NOTE — Telephone Encounter (Signed)
Patient states he was advised to call back in 2 weeks to give report on the gabapentin. He says he does feel like it is helping at all. Please advise if there is any changes he needs to make. Thanks!

## 2017-06-05 IMAGING — CR DG LUMBAR SPINE COMPLETE 4+V
5 series · 5 of 5 positions shown · non-contrast
Comparison: None.

CLINICAL DATA: Chronic lumbar pain w pain radiating into Rt hip and
Rt leg, no recent injury, no surg

EXAM:
LUMBAR SPINE - COMPLETE 4+ VIEW

[t l-spine a.p.]
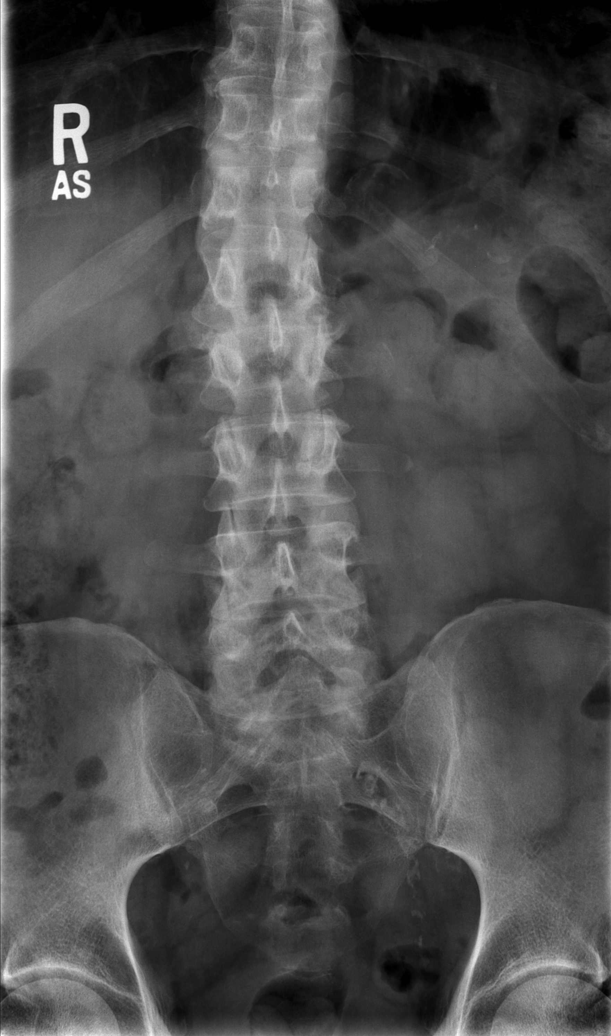

[t l-spine oblique exposure (1 of 2)]
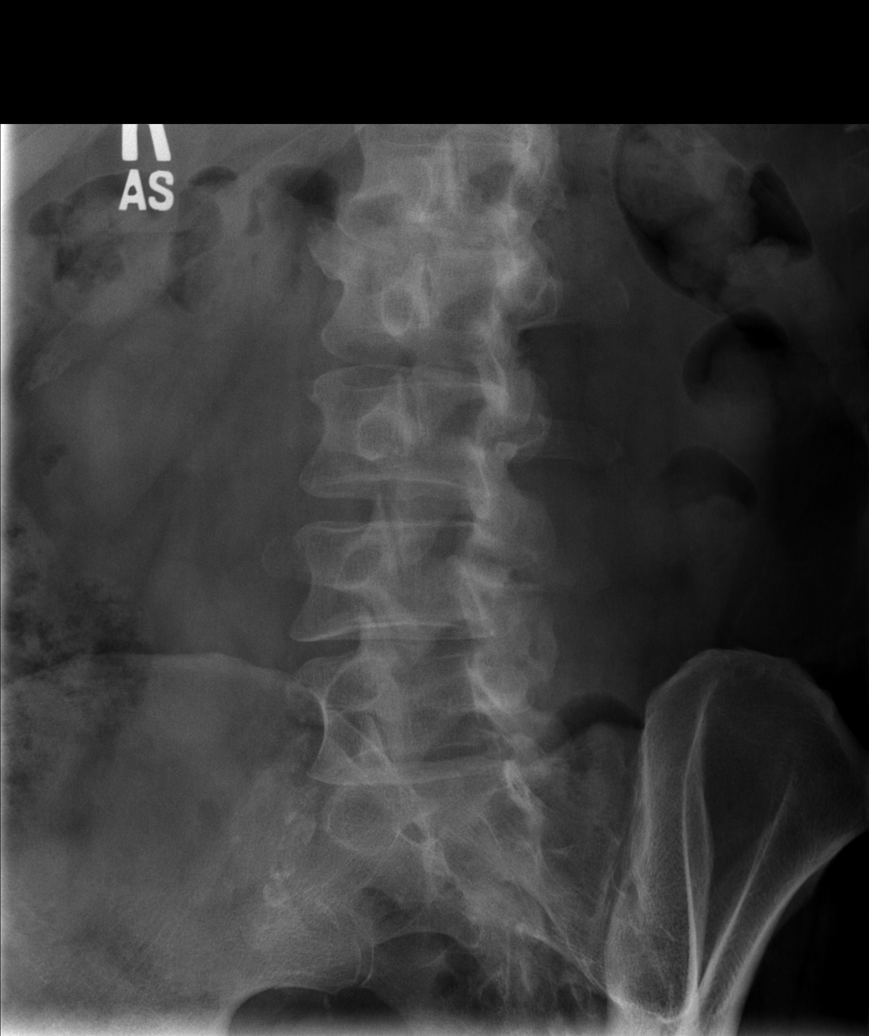

[t l-spine oblique exposure (2 of 2)]
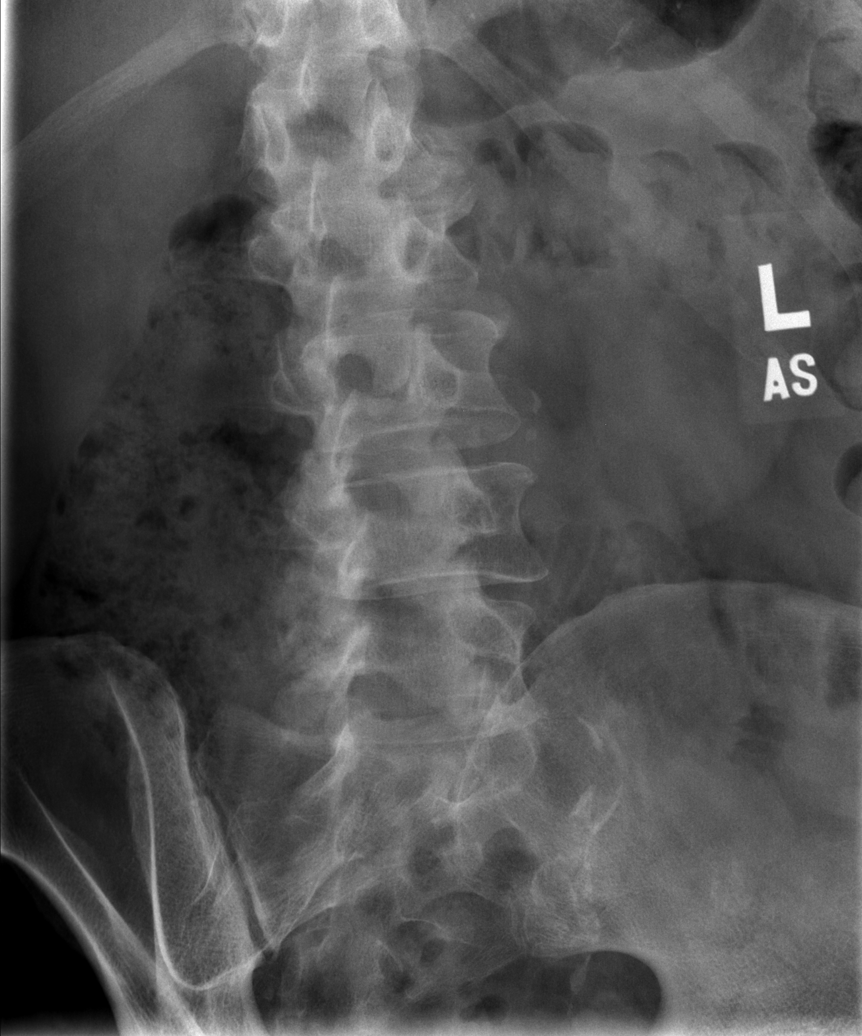

[t l-spine lat]
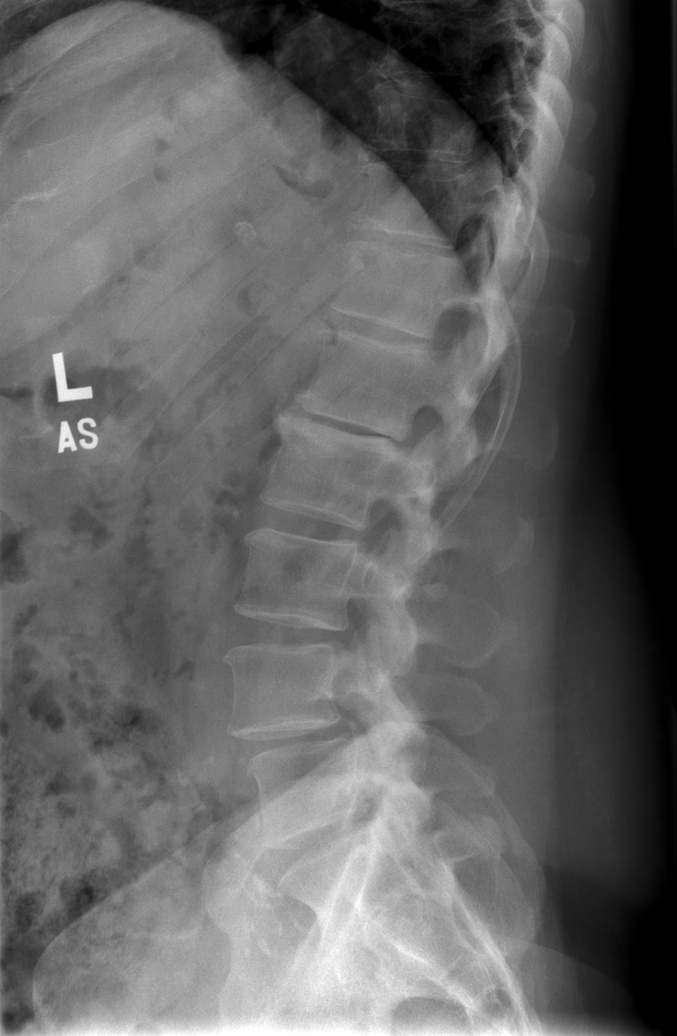

[t l-spine l5-s1 spot]
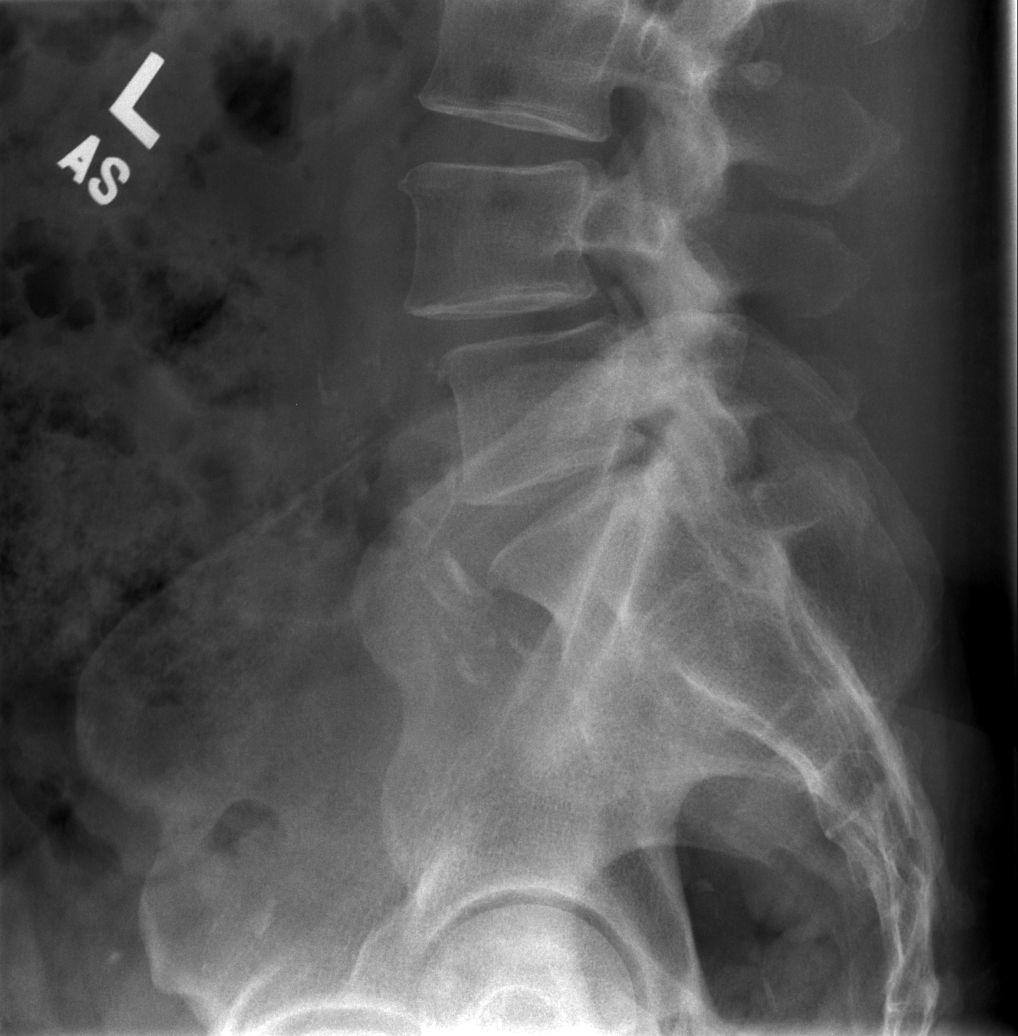

[5 of 5 positions shown; findings below may reference images not displayed]

FINDINGS: Five lumbar type vertebral bodies. Sacroiliac joints are symmetric.
Vascular calcifications. Maintenance of vertebral body height. Grade
1 L4-5 anterolisthesis. Facet arthropathy at L4-5 and L5-S1. Loss of
intervertebral disc height at L1-2.
IMPRESSION: Age advanced spondylosis. L4-5 grade 1 anterolisthesis is likely
secondary.

Significantly age advanced atherosclerosis.

## 2017-06-08 ENCOUNTER — Telehealth: Payer: Self-pay | Admitting: Family Medicine

## 2017-06-08 NOTE — Telephone Encounter (Signed)
-----   Message from Dorena Dew, [place] sent at 06/08/2017  1:11 PM EST ----- Please inform patient that he will have to schedule appointment to discuss worsening neuropathy. He may warrant a referral to neurology   Thanks

## 2017-06-08 NOTE — Telephone Encounter (Signed)
[place], Please advise. Thanks!  

## 2017-06-08 NOTE — Telephone Encounter (Signed)
Patient called to let [place] know that the Gabapentin is not working. Please advise.

## 2017-06-08 NOTE — Telephone Encounter (Signed)
Called and left a message for patient to call and schedule an appointment regarding the neuropathy getting worse. Thanks!

## 2017-07-28 ENCOUNTER — Other Ambulatory Visit: Payer: Self-pay

## 2017-07-28 DIAGNOSIS — E781 Pure hyperglyceridemia: Secondary | ICD-10-CM

## 2017-07-28 MED ORDER — FENOFIBRATE 145 MG PO TABS: 145.0000 mg | ORAL_TABLET | Freq: Every day | ORAL | 5 refills | Status: DC

## 2017-08-20 ENCOUNTER — Encounter: Payer: Self-pay | Admitting: Family Medicine

## 2017-08-20 ENCOUNTER — Ambulatory Visit: Payer: 59 | Admitting: Family Medicine

## 2017-08-20 VITALS — BP 122/88 | HR 82 | Temp 98.2°F | Resp 16 | Ht 68.5 in | Wt 212.0 lb

## 2017-08-20 DIAGNOSIS — E785 Hyperlipidemia, unspecified: Secondary | ICD-10-CM

## 2017-08-20 DIAGNOSIS — Z1211 Encounter for screening for malignant neoplasm of colon: Secondary | ICD-10-CM | POA: Diagnosis not present

## 2017-08-20 DIAGNOSIS — R635 Abnormal weight gain: Secondary | ICD-10-CM

## 2017-08-20 DIAGNOSIS — E663 Overweight: Secondary | ICD-10-CM

## 2017-08-20 DIAGNOSIS — Z6831 Body mass index (BMI) 31.0-31.9, adult: Secondary | ICD-10-CM | POA: Diagnosis not present

## 2017-08-20 NOTE — Patient Instructions (Addendum)
  Recommend a lowfat, low carbohydrate diet divided over 5-6 small meals, increase water intake to 6-8 glasses, and 150 minutes per week of cardiovascular exercise.   Prediabetes Eating Plan Prediabetes-also called impaired glucose tolerance or impaired fasting glucose-is a condition that causes blood sugar (blood glucose) levels to be higher than normal. Following a healthy diet can help to keep prediabetes under control. It can also help to lower the risk of type 2 diabetes and heart disease, which are increased in people who have prediabetes. Along with regular exercise, a healthy diet:  Promotes weight loss.  Helps to control blood sugar levels.  Helps to improve the way that the body uses insulin.  What do I need to know about this eating plan?  Use the glycemic index (GI) to plan your meals. The index tells you how quickly a food will raise your blood sugar. Choose low-GI foods. These foods take a longer time to raise blood sugar.  Pay close attention to the amount of carbohydrates in the food that you eat. Carbohydrates increase blood sugar levels.  Keep track of how many calories you take in. Eating the right amount of calories will help you to achieve a healthy weight. Losing about 7 percent of your starting weight can help to prevent type 2 diabetes.  You may want to follow a Mediterranean diet. This diet includes a lot of vegetables, lean meats or fish, whole grains, fruits, and healthy oils and fats. What foods can I eat? Grains Whole grains, such as whole-wheat or whole-grain breads, crackers, cereals, and pasta. Unsweetened oatmeal. Bulgur. Barley. Quinoa. Brown rice. Corn or whole-wheat flour tortillas or taco shells. Vegetables Lettuce. Spinach. Peas. Beets. Cauliflower. Cabbage. Broccoli. Carrots. Tomatoes. Squash. Eggplant. Herbs. Peppers. Onions. Cucumbers. Brussels sprouts. Fruits Berries. Bananas. Apples. Oranges. Grapes. Papaya. Mango. Pomegranate. Kiwi. Grapefruit.  Cherries. Meats and Other Protein Sources Seafood. Lean meats, such as chicken and [place] or lean cuts of pork and beef. Tofu. Eggs. Nuts. Beans. Dairy Low-fat or fat-free dairy products, such as yogurt, cottage cheese, and cheese. Beverages Water. Tea. Coffee. Sugar-free or diet soda. Seltzer water. Milk. Milk alternatives, such as soy or almond milk. Condiments Mustard. Relish. Low-fat, low-sugar ketchup. Low-fat, low-sugar barbecue sauce. Low-fat or fat-free mayonnaise. Sweets and Desserts Sugar-free or low-fat pudding. Sugar-free or low-fat ice cream and other frozen treats. Fats and Oils Avocado. Walnuts. Olive oil. The items listed above may not be a complete list of recommended foods or beverages. Contact your dietitian for more options. What foods are not recommended? Grains Refined white flour and flour products, such as bread, pasta, snack foods, and cereals. Beverages Sweetened drinks, such as sweet iced tea and soda. Sweets and Desserts Baked goods, such as cake, cupcakes, pastries, cookies, and cheesecake. The items listed above may not be a complete list of foods and beverages to avoid. Contact your dietitian for more information. This information is not intended to replace advice given to you by your health care provider. Make sure you discuss any questions you have with your health care provider. Document Released: 10/24/2014 Document Revised: 11/15/2015 Document Reviewed: 07/05/2014 Elsevier Interactive Patient Education  2017 [employer].

## 2017-08-21 LAB — TSH: TSH: 2.04 u[IU]/mL (ref 0.450–4.500)

## 2017-08-25 ENCOUNTER — Telehealth: Payer: Self-pay

## 2017-08-25 NOTE — Telephone Encounter (Signed)
-----   Message from Dorena Dew, [place] sent at 08/21/2017  9:55 AM EST ----- Regarding: lab results  Please inform patient that thyroid tests were within normal limits, no further workup of evaluation warranted at this time. Please follow up in 6 months as scheduled.    Thanks

## 2017-08-25 NOTE — Telephone Encounter (Signed)
Called, no answer. Left a voicemail advising that thyroid test were normal and no work up is needed at this time. Asked that he keep a 6 month follow up and call if any questions. Thanks!

## 2017-08-25 NOTE — Progress Notes (Signed)
Alex Charles, a 52 year old with a history of CHF, hyperlipidemia, neuropathy, and neuropathy presents complaining of weight gain. Patient says that he has gained 15 pounds over the past year. He has been exercising and following a balanced diet over the past 2 weeks. He says that eating is mostly at night. He resides with his parents and tends to eat late. He says that prior to starting exercise routine, other than working at [employer] he was mostly sedentary. He currently denies headache, dizziness, blurred vision, constipation, polyuria, polydipsia, or polyphagia.    Past Medical History:  Diagnosis Date  . CHF (congestive heart failure) ([place])   . Hyperlipidemia   . Prediabetes    Social History   Socioeconomic History  . Marital status: Single    Spouse name: Not on file  . Number of children: Not on file  . Years of education: Not on file  . Highest education level: Not on file  Social Needs  . Financial resource strain: Not on file  . Food insecurity - worry: Not on file  . Food insecurity - inability: Not on file  . Transportation needs - medical: Not on file  . Transportation needs - non-medical: Not on file  Occupational History  . Not on file  Tobacco Use  . Smoking status: Current Every Day Smoker    Packs/day: 0.50    Types: Cigarettes  . Smokeless tobacco: Never Used  Substance and Sexual Activity  . Alcohol use: No  . Drug use: No  . Sexual activity: Not on file  Other Topics Concern  . Not on file  Social History Narrative  . Not on file   Immunization History  Administered Date(s) Administered  . Influenza,inj,Quad PF,6+ Mos 04/02/2015  . Tdap 04/08/2017   Review of Systems  Constitutional: Negative.        Weight gain  HENT: Negative.   Eyes: Negative.   Respiratory: Negative.   Cardiovascular: Negative.   Gastrointestinal: Negative for constipation and diarrhea.  Genitourinary: Negative.   Musculoskeletal: Negative.   Skin:  Negative.   Neurological: Negative.   Endo/Heme/Allergies: Negative.   Psychiatric/Behavioral: Negative.    Physical Exam  Constitutional: He is oriented to person, place, and time. He appears well-developed and well-nourished.  Cardiovascular: Normal rate, regular rhythm and normal heart sounds.  Pulmonary/Chest: Effort normal and breath sounds normal.  Abdominal: Soft. Bowel sounds are normal.  Musculoskeletal: Normal range of motion.  Neurological: He is alert and oriented to person, place, and time.  Skin: Skin is warm and dry.  BP 122/88 (BP Location: Left Arm, Patient Position: Sitting, Cuff Size: Normal)   Pulse 82   Temp 98.2 F (36.8 C) (Oral)   Resp 16   Ht 5' 8.5" (1.74 m)   Wt 212 lb (96.2 kg)   SpO2 98%   BMI 31.77 kg/m   1. Overweight Discussed diet and exercise regimen at length Provided 1800 calorie carbohydrate modified diet Recommend a lowfat, low carbohydrate diet divided over 5-6 small meals, increase water intake to 6-8 glasses, and 150 minutes per week of cardiovascular exercise.   - TSH  2. BMI 31.0-31.9,adult Weight loss goal is 10 pounds over the next 6 months 3. Colon cancer screening Will complete Cologuard form. Patient overdue for colon cancer screening.   4. Weight gain The patient is asked to make an attempt to improve diet and exercise patterns to aid in management of this problem.  - TSH  5. Hyperlipidemia LDL goal <  100 The 10-year ASCVD risk score Mikey Bussing DC Jr., et al., 2013) is: 17.3%   Values used to calculate the score:     Age: 1 years     Sex: Male     Is Non-Hispanic African American: No     Diabetic: No     Tobacco smoker: Yes     Systolic Blood Pressure: 619 mmHg     Is BP treated: No     HDL Cholesterol: 39 mg/dL     Total Cholesterol: 300 mg/dL - Lipid Panel; Future    RTC: 6 months for weight gain   Donia Pounds  MSN, FNP-C [facility] [address] [place], [place] [address] [phone]

## 2017-09-03 ENCOUNTER — Other Ambulatory Visit: Payer: Self-pay

## 2017-09-03 DIAGNOSIS — G629 Polyneuropathy, unspecified: Secondary | ICD-10-CM

## 2017-09-03 MED ORDER — GABAPENTIN 300 MG PO CAPS: 300.0000 mg | ORAL_CAPSULE | Freq: Three times a day (TID) | ORAL | 3 refills | Status: DC

## 2018-02-01 NOTE — Progress Notes (Signed)
Mr. Sensabaugh has joined the [employer] in the recent past and has requested to speak about appropriate exercise and issues w/calf pain he experiences.  He is doing cardio on a few seated eliptical machines as well as swimming in the pool.  I have gone through the group fitness schedule w/him indicating which classes are appropriate and which may not be.  I have also suggested he start w/some chair yoga and/or gentle yoga classes to help him stretching in a supervised class setting.  He states his calves get sore w/extended periods of walking,especially while working, but denies feelings of shob, chest pain, or other symptoms that accompanies it.  When asked if he's seen his PMD for the pain, he states he has and also has a physical coming up in the next few weeks.  He has been invited to stop in and have any of his biometrics done (bodyfat, bmi, vitals) or with any questions during office hours or by appointment.

## 2018-02-05 ENCOUNTER — Other Ambulatory Visit: Payer: Self-pay | Admitting: Family Medicine

## 2018-02-05 DIAGNOSIS — E781 Pure hyperglyceridemia: Secondary | ICD-10-CM

## 2018-02-17 ENCOUNTER — Ambulatory Visit (INDEPENDENT_AMBULATORY_CARE_PROVIDER_SITE_OTHER): Payer: 59 | Admitting: Family Medicine

## 2018-02-17 ENCOUNTER — Encounter: Payer: Self-pay | Admitting: Family Medicine

## 2018-02-17 VITALS — BP 126/87 | HR 86 | Temp 98.2°F | Resp 14 | Ht 68.5 in | Wt 220.0 lb

## 2018-02-17 DIAGNOSIS — F411 Generalized anxiety disorder: Secondary | ICD-10-CM | POA: Diagnosis not present

## 2018-02-17 DIAGNOSIS — M79672 Pain in left foot: Secondary | ICD-10-CM

## 2018-02-17 DIAGNOSIS — G8929 Other chronic pain: Secondary | ICD-10-CM

## 2018-02-17 DIAGNOSIS — M791 Myalgia, unspecified site: Secondary | ICD-10-CM

## 2018-02-17 DIAGNOSIS — G629 Polyneuropathy, unspecified: Secondary | ICD-10-CM

## 2018-02-17 DIAGNOSIS — Z1211 Encounter for screening for malignant neoplasm of colon: Secondary | ICD-10-CM

## 2018-02-17 DIAGNOSIS — Z23 Encounter for immunization: Secondary | ICD-10-CM | POA: Diagnosis not present

## 2018-02-17 DIAGNOSIS — F32A Depression, unspecified: Secondary | ICD-10-CM

## 2018-02-17 DIAGNOSIS — F329 Major depressive disorder, single episode, unspecified: Secondary | ICD-10-CM

## 2018-02-17 MED ORDER — BUSPIRONE HCL 15 MG PO TABS: 15.0000 mg | ORAL_TABLET | Freq: Two times a day (BID) | ORAL | 5 refills | Status: DC

## 2018-02-17 NOTE — Patient Instructions (Addendum)
I increased the buspar to 15mg  twice a day. I referred you to podiatry for further evaluation of the heel and leg pain. I also put in a referral for your colonoscopy. It was good to meet you and I look forward to working with you.     Achilles Tendinitis Achilles tendinitis is inflammation of the tough, cord-like band that attaches the lower leg muscles to the heel bone (Achilles tendon). This is usually caused by overusing the tendon and the ankle joint. Achilles tendinitis usually gets better over time with treatment and caring for yourself at home. It can take weeks or months to heal completely. What are the causes? This condition may be caused by:  A sudden increase in exercise or activity, such as running.  Doing the same exercises or activities (such as jumping) over and over.  Not warming up calf muscles before exercising.  Exercising in shoes that are worn out or not made for exercise.  Having arthritis or a bone growth (spur) on the back of the heel bone. This can rub against the tendon and hurt it.  Age-related wear and tear. Tendons become less flexible with age and more likely to be injured.  What are the signs or symptoms? Common symptoms of this condition include:  Pain in the Achilles tendon or in the back of the leg, just above the heel. The pain usually gets worse with exercise.  Stiffness or soreness in the back of the leg, especially in the morning.  Swelling of the skin over the Achilles tendon.  Thickening of the tendon.  Bone spurs at the bottom of the Achilles tendon, near the heel.  Trouble standing on tiptoe.  How is this diagnosed? This condition is diagnosed based on your symptoms and a physical exam. You may have tests, including:  X-rays.  MRI.  How is this treated? The goal of treatment is to relieve symptoms and help your injury heal. Treatment may include:  Decreasing or stopping activities that caused the tendinitis. This may mean  switching to low-impact exercises like biking or swimming.  Icing the injured area.  Doing physical therapy, including strengthening and stretching exercises.  NSAIDs to help relieve pain and swelling.  Using supportive shoes, wraps, heel lifts, or a walking boot (air cast).  Surgery. This may be done if your symptoms do not improve after 6 months.  Using high-energy shock wave impulses to stimulate the healing process (extracorporeal shock wave therapy). This is rare.  Injection of medicines to help relieve inflammation (corticosteroids). This is rare.  Follow these instructions at home: If you have an air cast:  Wear the cast as told by your health care provider. Remove it only as told by your health care provider.  Loosen the cast if your toes tingle, become numb, or turn cold and blue. Activity  Gradually return to your normal activities once your health care provider approves. Do not do activities that cause pain. ? Consider doing low-impact exercises, like cycling or swimming.  If you have an air cast, ask your health care provider when it is safe for you to drive.  If physical therapy was prescribed, do exercises as told by your health care provider or physical therapist. Managing pain, stiffness, and swelling  Raise (elevate) your foot above the level of your heart while you are sitting or lying down.  Move your toes often to avoid stiffness and to lessen swelling.  If directed, put ice on the injured area: ? Put ice in  a plastic bag. ? Place a towel between your skin and the bag. ? Leave the ice on for 20 minutes, 2-3 times a day General instructions  If directed, wrap your foot with an elastic bandage or other wrap. This can help keep your tendon from moving too much while it heals. Your health care provider will show you how to wrap your foot correctly.  Wear supportive shoes or heel lifts only as told by your health care provider.  Take over-the-counter and  prescription medicines only as told by your health care provider.  Keep all follow-up visits as told by your health care provider. This is important. Contact a health care provider if:  You have symptoms that gets worse.  You have pain that does not get better with medicine.  You develop new, unexplained symptoms.  You develop warmth and swelling in your foot.  You have a fever. Get help right away if:  You have a sudden popping sound or sensation in your Achilles tendon followed by severe pain.  You cannot move your toes or foot.  You cannot put any weight on your foot. Summary  Achilles tendinitis is inflammation of the tough, cord-like band that attaches the lower leg muscles to the heel bone (Achilles tendon).  This condition is usually caused by overusing the tendon and the ankle joint. It can also be caused by arthritis or normal aging.  The most common symptoms of this condition include pain, swelling, or stiffness in the Achilles tendon or in the back of the leg.  This condition is usually treated with rest, NSAIDs, and physical therapy. This information is not intended to replace advice given to you by your health care provider. Make sure you discuss any questions you have with your health care provider. Document Released: 03/19/2005 Document Revised: 04/28/2016 Document Reviewed: 04/28/2016 Elsevier Interactive Patient Education  2017 [employer].

## 2018-02-17 NOTE — Progress Notes (Signed)
[facility]  Chronic Disease Follow Up Provider: Lanae Boast, FNP  SUBJECTIVE:  Patient presents for follow up for the following  chronic conditions.  has a past medical history of CHF (congestive heart failure) ([place]), Hyperlipidemia, and Prediabetes. Patient states that he continues to have pain in legs and feet. States that he would like to have an increase in his depression medication. He feels that he is no longer at the severeity of depression as he once was, but he is around several negative people.  Patient states that he is starting to work out, due yoga, and use a foam roller to help with leg pain.    Hypertension  Blood pressure is well controlled at home. Patient denies chest pain, dyspnea, fatigue, irregular heart beat and lower extremity edema. Medication compliance: Yes  Diabetes medication compliance: compliant all of the time   He is exercising and is adherent to a low-salt, ADA, heart healthy or carbohydrate modified diet.   ROS  OBJECTIVE:  BP 126/87 (BP Location: Left Arm, Patient Position: Sitting, Cuff Size: Large)   Pulse 86   Temp 98.2 F (36.8 C) (Oral)   Resp 14   Ht 5' 8.5" (1.74 m)   Wt 220 lb (99.8 kg)   SpO2 99%   BMI 32.96 kg/m   Physical Exam  Constitutional: He is oriented to person, place, and time and well-developed, well-nourished, and in no distress. No distress.  HENT:  Head: Normocephalic and atraumatic.  Eyes: Pupils are equal, round, and reactive to light. Conjunctivae and EOM are normal.  Neck: Normal range of motion. Neck supple.  Cardiovascular: Normal rate, regular rhythm and intact distal pulses. Exam reveals no gallop and no friction rub.  No murmur heard. Pulmonary/Chest: Effort normal and breath sounds normal. No respiratory distress. He has no wheezes.  Abdominal: Soft. Bowel sounds are normal. There is no tenderness.  Musculoskeletal: Normal range of motion. He  exhibits no edema or tenderness.       Feet:  Lymphadenopathy:    He has no cervical adenopathy.  Neurological: He is alert and oriented to person, place, and time. Gait normal.  Skin: Skin is warm and dry.  Psychiatric: Mood, memory, affect and judgment normal.  Nursing note and vitals reviewed.    ASSESSMENT/PLAN: 1. Flu vaccine need Administered - Flu Vaccine QUAD 6+ mos PF IM (Fluarix Quad PF)  2. Depression, unspecified depression type - busPIRone (BUSPAR) 15 MG tablet; Take 1 tablet (15 mg total) by mouth 2 (two) times daily.  Dispense: 60 tablet; Refill: 5 - TSH  3. Generalized anxiety disorder Increased buspar to 15mg  PO BID.  - busPIRone (BUSPAR) 15 MG tablet; Take 1 tablet (15 mg total) by mouth 2 (two) times daily.  Dispense: 60 tablet; Refill: 5 - TSH  4. Myalgia - Vitamin D, 25-hydroxy - Vitamin B12 - CBC with Differential - Comprehensive metabolic panel  5. Neuropathy - Vitamin B12 - TSH  6. Heel pain, chronic, left - Ambulatory referral to Podiatry  7. Screen for colon cancer - Ambulatory referral to Gastroenterology    Return to care as scheduled and prn. Patient verbalized understanding and agreed with plan of care.   Ms. Doug Sou. Nathaneil Canary, FNP-BC Patient [facility] [place], [place] [id number] [phone]    The 2018 Physical Activity Guidelines recommend the equivalent of 150 minutes per week of moderate to vigorous aerobic activity each week, with  muscle-strengthening activities on two days during the week    PRESCRIPTION FOR EXERCISE  Frequency Four to five days per week  Intensity Moderate- During moderate intensity exercise, a person is too winded to sing but is not so winded they cannot talk  Time 30 minutes or a total of 150 minutes per week.   Type Brisk walking PLUS One set each of: 0 Body weight squats  10 0 Plank hold for 30 seconds   Basic bodyweight exercise guide: Squat and  plank  (A) Body weight squat: 0 The squat develops strength in the legs and torso. Stand with your feet about shoulder's width apart and slightly externally rotated. Maintain your weight on your heels or midfoot throughout the exercise. Keep your lower back flat; do not allow it to round or to extend excessively. Your knees should remain aligned with your ankles and legs throughout the motion (knees caving inward towards one another is a common problem). Descend until your hips come just below the level of your knees. If strength or mobility limitations prevent you from reaching this depth, begin with partial squats and gradually increase the depth over time. Once you can perform 15 squats with proper technique and full depth, make the exercise more challenging by holding a weight. (B) Standard plank: 0 The basic plank develops torso (core) strength. Maintain a straight torso, not allowing your lower back to sag or your hips to elevate, throughout the exercise. Gradually increase the time you can hold the position.

## 2018-02-18 LAB — CBC WITH DIFFERENTIAL/PLATELET
Basophils Absolute: 0.1 10*3/uL (ref 0.0–0.2)
Basos: 1 %
EOS (ABSOLUTE): 0.2 10*3/uL (ref 0.0–0.4)
Eos: 2 %
Hematocrit: 48.1 % (ref 37.5–51.0)
Hemoglobin: 16.5 g/dL (ref 13.0–17.7)
Immature Grans (Abs): 0 10*3/uL (ref 0.0–0.1)
Immature Granulocytes: 0 %
Lymphocytes Absolute: 3.5 10*3/uL — ABNORMAL HIGH (ref 0.7–3.1)
Lymphs: 39 %
MCH: 32.6 pg (ref 26.6–33.0)
MCHC: 34.3 g/dL (ref 31.5–35.7)
MCV: 95 fL (ref 79–97)
Monocytes Absolute: 0.8 10*3/uL (ref 0.1–0.9)
Monocytes: 9 %
Neutrophils Absolute: 4.5 10*3/uL (ref 1.4–7.0)
Neutrophils: 49 %
Platelets: 234 10*3/uL (ref 150–450)
RBC: 5.06 x10E6/uL (ref 4.14–5.80)
RDW: 13.8 % (ref 12.3–15.4)
WBC: 9.1 10*3/uL (ref 3.4–10.8)

## 2018-02-18 LAB — TSH: TSH: 1.45 u[IU]/mL (ref 0.450–4.500)

## 2018-02-18 LAB — COMPREHENSIVE METABOLIC PANEL
ALT: 48 IU/L — ABNORMAL HIGH (ref 0–44)
AST: 41 IU/L — ABNORMAL HIGH (ref 0–40)
Albumin/Globulin Ratio: 2.2 (ref 1.2–2.2)
Albumin: 4.7 g/dL (ref 3.5–5.5)
Alkaline Phosphatase: 52 IU/L (ref 39–117)
BUN/Creatinine Ratio: 15 (ref 9–20)
BUN: 17 mg/dL (ref 6–24)
Bilirubin Total: 0.3 mg/dL (ref 0.0–1.2)
CO2: 23 mmol/L (ref 20–29)
Calcium: 10.1 mg/dL (ref 8.7–10.2)
Chloride: 101 mmol/L (ref 96–106)
Creatinine, Ser: 1.1 mg/dL (ref 0.76–1.27)
GFR calc Af Amer: 88 mL/min/{1.73_m2} (ref >59)
GFR calc non Af Amer: 76 mL/min/{1.73_m2} (ref >59)
Globulin, Total: 2.1 g/dL (ref 1.5–4.5)
Glucose: 86 mg/dL (ref 65–99)
Potassium: 4.4 mmol/L (ref 3.5–5.2)
Sodium: 141 mmol/L (ref 134–144)
Total Protein: 6.8 g/dL (ref 6.0–8.5)

## 2018-02-18 LAB — VITAMIN B12: Vitamin B-12: 1219 pg/mL (ref 232–1245)

## 2018-02-18 LAB — VITAMIN D 25 HYDROXY (VIT D DEFICIENCY, FRACTURES): Vit D, 25-Hydroxy: 24.9 ng/mL — ABNORMAL LOW (ref 30.0–100.0)

## 2018-02-23 ENCOUNTER — Encounter: Payer: Self-pay | Admitting: Gastroenterology

## 2018-03-10 ENCOUNTER — Other Ambulatory Visit: Payer: Self-pay | Admitting: Family Medicine

## 2018-03-10 ENCOUNTER — Ambulatory Visit: Payer: 59 | Admitting: Podiatry

## 2018-03-10 ENCOUNTER — Encounter: Payer: Self-pay | Admitting: Podiatry

## 2018-03-10 VITALS — BP 115/72 | HR 78 | Ht 68.0 in | Wt 220.0 lb

## 2018-03-10 DIAGNOSIS — L600 Ingrowing nail: Secondary | ICD-10-CM | POA: Diagnosis not present

## 2018-03-10 DIAGNOSIS — M79661 Pain in right lower leg: Secondary | ICD-10-CM | POA: Diagnosis not present

## 2018-03-10 DIAGNOSIS — M24571 Contracture, right ankle: Secondary | ICD-10-CM | POA: Diagnosis not present

## 2018-03-10 DIAGNOSIS — M7662 Achilles tendinitis, left leg: Secondary | ICD-10-CM | POA: Diagnosis not present

## 2018-03-10 DIAGNOSIS — I739 Peripheral vascular disease, unspecified: Secondary | ICD-10-CM | POA: Diagnosis not present

## 2018-03-10 DIAGNOSIS — M79662 Pain in left lower leg: Secondary | ICD-10-CM | POA: Diagnosis not present

## 2018-03-10 MED ORDER — VITAMIN D (ERGOCALCIFEROL) 1.25 MG (50000 UNIT) PO CAPS: 50000.0000 [IU] | ORAL_CAPSULE | ORAL | 0 refills | Status: AC

## 2018-03-10 NOTE — Progress Notes (Signed)
Sent vit d to [employer]

## 2018-03-10 NOTE — Patient Instructions (Signed)
Seen for bilateral foot and leg pain. Noted of decreased blood flow in lower limbs, Varicose veins L>R, tight Achilles tendon, Ingrown hallucal nail right great toe medial border. Will put in Vascular consult order. Need compression socks to protect Varicose veins. Equinus braced fitted with home care instruction including daily stretch exercise. Continue with current level of exercise regimen. May benefit from custom orthotics. Call to set up ingrown nail surgery. Return in 1 month for follow up on Equinus brace.

## 2018-03-10 NOTE — Progress Notes (Signed)
SUBJECTIVE: 52 y.o. year old male presents stating that he is having difficulty walking due to pain in calf. Having foot and calf pain x 1 year.  Also has a big knot in Achilles tendon on left. He was treated with Gabapentin, which did not make any difference. Was told that he is a pre diabetic and been exercising. Patient is engaged in regular exercise, swimming, stationary bike riding.  Positive history of Alcohol addiction with medical complication.   Review of Systems  Constitutional: Negative.   HENT: Negative.   Eyes: Negative.   Respiratory: Negative.   Cardiovascular:       Stent in heart 3 years ago and has not had any problem since.  Gastrointestinal: Negative.   Genitourinary: Negative.   Musculoskeletal:       Hip pain.  Skin: Negative.      OBJECTIVE: DERMATOLOGIC EXAMINATION: Ingrown hallucal nail on right great toe medial border with pain. No active inflammation or drainage noted.  VASCULAR EXAMINATION OF LOWER LIMBS: Pedal pulses of lower limbs are not palpable except faintly palpable Dorsalis pedis on right foot. Visible varicose vein in both lower limbs L>R, symptomatic at times.  NEUROLOGIC EXAMINATION OF THE LOWER LIMBS: All epicritic and tactile sensations grossly intact. Sharp and Dull discriminatory sensations at the plantar ball of hallux is intact bilateral.   MUSCULOSKELETAL EXAMINATION: Positive for tight Achilles tendon bilateral. Fibrotic mass over Achilles tendon near insertion site left, symptomatic.  ASSESSMENT: Claudication both lower limbs. Varicose veins in calf area L>R. Ankle equinus bilateral. Achilles tendonitis left. Ingrown hallucal nail right medial border.  PLAN: Reviewed findings and available treatment options. Advised to use compression socks during the day for symptomatic varicose veins. Reviewed daily stretch exercise for tight Achilles tendon bilateral. Equinus brace fitted and dispensed with home care  instruction. Will place referral order for Vascular consultation for Claudication lower limbs. Set up an appointment for ingrown nail procedure. May benefit from custom orthotics. Will call for benefit information. Return in one month for follow up on Equinus brace.  Equinus Brace Fitting and Dispensing Document: The plastic custom fitted Ankle Foot Orthosis was assembled in accordance with the manufacturer's instructions and based on the specific anatomical and physiological requirements of the patient. This device was custom fitted by me with expertise to provide this service and dispensed for the right foot. The patient was examined while wearing the device after several fitting maneuvers of trimming, bending, shaping, etc. and the fit was found to be appropriate.    Medical necessity: Due to the pain in the foot/ankle/leg with weight bearing throughout the day, with diagnosis of equinus deformity and related symptoms, this is medically necessary for the treatment.  The function of this device is to serve as an anti contracture device of the Gastrocsoleal complex and to restrict and limit motion and help reduce excessive stress and strain to [employer] complex and foot/ankle/leg. It is being utilized to prevent the plantar contracture of the Gastrocsoleal complex.   The goal of the therapy are to:  1. Treat plantarflexion contracture of the ankle with dorsiflexion, the ankle on passive range of motion testing is noted to have at least 10 degrees (I.e, a non-fixed contracture). 2. Provide a reasonable expectation of the ability to correct the contracture.  3. Reduce the significantly with the beneficiary's functional abilities.  4. Used as a component of a therapy program which includes active stretching of the involved muscles and/or tendons. 5. Treat the beneficiary's plantar fasciitis. Additionally, medial and  lateral ankle dorsiflex assistive and plantarflex restraint hinges are included  on the brace and were set at 90 degrees of dorsiflexion. These will be periodically adjusted based on future physical examination of the patient's progress.  Instructions and Goals for the Patient: The device will be utilized for the next 8-12 weeks. The intent of these hinges is to resist plantarflexion and assist with dorsiflexion of the Gastrocsoleal complex and/or plantar fascia. These hinges will be adjusted over the course of the patient's therapy. The patient was instructed not to adjust the hinges. Patient was advised to bring the device to the office on next visit for further evaluation and adjustments.  The goals and function of this device was explained in detail to the patient. The patient stated that the device was comfortable when applied. The patient was shown and told in detail how to properly apply, remove, wear, and care for the device. The patient was able to apply and remove the device properly without assistance. Patient as advised not to ambulate with the device in place.  The device was then dispensed and was suitable for the condition and not substandard. No guarantees regarding resolution of symptoms were given and precautions were reviewed.  Written instructions and warranty information were given along with the list of the current [employer].  The patient signed written proof of delivery. All questions were answered to the patient's satisfaction. The patent also given a follow up appointment in one month.

## 2018-03-11 ENCOUNTER — Telehealth: Payer: Self-pay

## 2018-03-11 NOTE — Telephone Encounter (Signed)
-----   Message from Lanae Boast, [place] sent at 03/10/2018  4:56 PM EDT ----- Low Vitamin D. All other labs are stable. I will send Vit D to the pharmacy. After completion of the prescription, patient will need otc vitamin D of 1,000units daily.

## 2018-03-11 NOTE — Telephone Encounter (Signed)
Called and left a message for patient that vitamin D was low and we have prescribed a vitamin D supplement to take once weekly. Advised when this is complete to take otc vitamin D 1000 units daily. Asked if any questions to call back to our office. Thanks!

## 2018-03-12 ENCOUNTER — Other Ambulatory Visit: Payer: Self-pay

## 2018-03-12 DIAGNOSIS — F32A Depression, unspecified: Secondary | ICD-10-CM

## 2018-03-12 DIAGNOSIS — F411 Generalized anxiety disorder: Secondary | ICD-10-CM

## 2018-03-12 DIAGNOSIS — F329 Major depressive disorder, single episode, unspecified: Secondary | ICD-10-CM

## 2018-03-12 MED ORDER — BUSPIRONE HCL 15 MG PO TABS: 15.0000 mg | ORAL_TABLET | Freq: Two times a day (BID) | ORAL | 1 refills | Status: DC

## 2018-03-15 ENCOUNTER — Telehealth: Payer: Self-pay | Admitting: Family Medicine

## 2018-03-15 DIAGNOSIS — F411 Generalized anxiety disorder: Secondary | ICD-10-CM

## 2018-03-15 DIAGNOSIS — F32A Depression, unspecified: Secondary | ICD-10-CM

## 2018-03-15 DIAGNOSIS — F329 Major depressive disorder, single episode, unspecified: Secondary | ICD-10-CM

## 2018-03-15 MED ORDER — BUSPIRONE HCL 15 MG PO TABS: 15.0000 mg | ORAL_TABLET | Freq: Two times a day (BID) | ORAL | 1 refills | Status: DC

## 2018-03-15 NOTE — Telephone Encounter (Signed)
Patient would like 90 day supply of Buspar sent to [facility].

## 2018-03-15 NOTE — Telephone Encounter (Signed)
[facility]

## 2018-03-22 ENCOUNTER — Other Ambulatory Visit: Payer: Self-pay

## 2018-03-22 DIAGNOSIS — I739 Peripheral vascular disease, unspecified: Secondary | ICD-10-CM

## 2018-03-31 ENCOUNTER — Encounter: Payer: Self-pay | Admitting: Gastroenterology

## 2018-03-31 ENCOUNTER — Ambulatory Visit (AMBULATORY_SURGERY_CENTER): Payer: Self-pay

## 2018-03-31 VITALS — Ht 68.0 in | Wt 217.2 lb

## 2018-03-31 DIAGNOSIS — Z1211 Encounter for screening for malignant neoplasm of colon: Secondary | ICD-10-CM

## 2018-03-31 NOTE — Progress Notes (Signed)
Per pt, no allergies to soy or egg products.Pt not taking any weight loss meds or using  O2 at home.  Pt refused emmi video. 

## 2018-04-14 ENCOUNTER — Ambulatory Visit (AMBULATORY_SURGERY_CENTER): Payer: 59 | Admitting: Gastroenterology

## 2018-04-14 ENCOUNTER — Encounter: Payer: Self-pay | Admitting: Gastroenterology

## 2018-04-14 ENCOUNTER — Ambulatory Visit: Payer: 59 | Admitting: Podiatry

## 2018-04-14 VITALS — BP 123/69 | HR 56 | Temp 97.0°F | Resp 14 | Ht 68.0 in | Wt 220.0 lb

## 2018-04-14 DIAGNOSIS — Z1211 Encounter for screening for malignant neoplasm of colon: Secondary | ICD-10-CM

## 2018-04-14 DIAGNOSIS — D122 Benign neoplasm of ascending colon: Secondary | ICD-10-CM

## 2018-04-14 MED ORDER — SODIUM CHLORIDE 0.9 % IV SOLN: 500.0000 mL | Freq: Once | INTRAVENOUS | Status: DC

## 2018-04-14 NOTE — Progress Notes (Signed)
Called to room to assist during endoscopic procedure.  Patient ID and intended procedure confirmed with present staff. Received instructions for my participation in the procedure from the performing physician.  

## 2018-04-14 NOTE — Op Note (Signed)
[facility] Patient Name: [facility] Procedure Date: 04/14/2018 11:45 AM MRN: [id number] Endoscopist: Thornton Park MD, MD Age: 52 Referring MD:  Date of Birth: 1965-12-31 Gender: Male Account #: [id number] Procedure:                Colonoscopy Indications:              Screening for colorectal malignant neoplasm. There                            is no known family history of colon cancer or                            polyps. No baseline GI symptoms. The patient had a                            normal screening colonoscopy in [place] over 10                            years ago. Medicines:                See the Anesthesia note for documentation of the                            administered medications Procedure:                Pre-Anesthesia Assessment:                           - Prior to the procedure, a History and Physical                            was performed, and patient medications and                            allergies were reviewed. The patient's tolerance of                            previous anesthesia was also reviewed. The risks                            and benefits of the procedure and the sedation                            options and risks were discussed with the patient.                            All questions were answered, and informed consent                            was obtained. Prior Anticoagulants: The patient has                            taken no previous anticoagulant or antiplatelet  agents. ASA Grade Assessment: II - A patient with                            mild systemic disease. After reviewing the risks                            and benefits, the patient was deemed in                            satisfactory condition to undergo the procedure.                           After obtaining informed consent, the colonoscope                            was passed under direct vision. Throughout the                             procedure, the patient's blood pressure, pulse, and                            oxygen saturations were monitored continuously. The                            Colonoscope was introduced through the anus and                            advanced to the the terminal ileum, with                            identification of the appendiceal orifice and IC                            valve. The colonoscopy was performed without                            difficulty. The patient tolerated the procedure                            well. The quality of the bowel preparation was good. Scope In: 11:50:46 AM Scope Out: 12:12:07 PM Scope Withdrawal Time: 0 hours 17 minutes 10 seconds  Total Procedure Duration: 0 hours 21 minutes 21 seconds  Findings:                 The perianal and digital rectal examinations were                            normal.                           A 10 mm serpiginous polyp was found in the                            ascending colon. The polyp was flat. The polyp was  removed with a piecemeal technique using a cold                            snare. Resection and retrieval were complete.                           A 3 mm polyp was found in the ascending colon. The                            polyp was sessile. The polyp was removed with a                            cold snare. Resection and retrieval were complete.                           The exam was otherwise without abnormality on                            direct and retroflexion views. Complications:            No immediate complications. Estimated Blood Loss:     Estimated blood loss: none. Impression:               - One 10 mm polyp in the ascending colon, removed                            piecemeal using a cold snare. Resected and                            retrieved.                           - One 3 mm polyp in the ascending colon, removed                             with a cold snare. Resected and retrieved.                           - The examination was otherwise normal on direct                            and retroflexion views. Recommendation:           - Discharge patient to home.                           - Resume previous diet today.                           - Continue present medications.                           - Await pathology results.                           - Repeat colonoscopy (date not yet determined)  for                            surveillance based on pathology results. I will                            notify you with my recommendations after reviewing                            the results. Thornton Park MD, MD 04/14/2018 12:18:44 PM This report has been signed electronically.

## 2018-04-14 NOTE — Patient Instructions (Signed)
YOU HAD AN ENDOSCOPIC PROCEDURE TODAY AT [facility]:   Refer to the procedure report that was given to you for any specific questions about what was found during the examination.  If the procedure report does not answer your questions, please call your gastroenterologist to clarify.  If you requested that your care partner not be given the details of your procedure findings, then the procedure report has been included in a sealed envelope for you to review at your convenience later.  YOU SHOULD EXPECT: Some feelings of bloating in the abdomen. Passage of more gas than usual.  Walking can help get rid of the air that was put into your GI tract during the procedure and reduce the bloating. If you had a lower endoscopy (such as a colonoscopy or flexible sigmoidoscopy) you may notice spotting of blood in your stool or on the toilet paper. If you underwent a bowel prep for your procedure, you may not have a normal bowel movement for a few days.  Please Note:  You might notice some irritation and congestion in your nose or some drainage.  This is from the oxygen used during your procedure.  There is no need for concern and it should clear up in a day or so.  SYMPTOMS TO REPORT IMMEDIATELY:   Following lower endoscopy (colonoscopy or flexible sigmoidoscopy):  Excessive amounts of blood in the stool  Significant tenderness or worsening of abdominal pains  Swelling of the abdomen that is new, acute  Fever of 100F or higher  Please see handout on polyps.  For urgent or emergent issues, a gastroenterologist can be reached at any hour by calling [phone].   DIET:  We do recommend a small meal at first, but then you may proceed to your regular diet.  Drink plenty of fluids but you should avoid alcoholic beverages for 24 hours.  ACTIVITY:  You should plan to take it easy for the rest of today and you should NOT DRIVE or use heavy machinery until tomorrow (because of the sedation  medicines used during the test).    FOLLOW UP: Our staff will call the number listed on your records the next business day following your procedure to check on you and address any questions or concerns that you may have regarding the information given to you following your procedure. If we do not reach you, we will leave a message.  However, if you are feeling well and you are not experiencing any problems, there is no need to return our call.  We will assume that you have returned to your regular daily activities without incident.  If any biopsies were taken you will be contacted by phone or by letter within the next 1-3 weeks.  Please call us at [phone] if you have not heard about the biopsies in 3 weeks.    SIGNATURES/CONFIDENTIALITY: You and/or your care partner have signed paperwork which will be entered into your electronic medical record.  These signatures attest to the fact that that the information above on your After Visit Summary has been reviewed and is understood.  Full responsibility of the confidentiality of this discharge information lies with you and/or your care-partner.   Thank you for allowing [place] to provide your healthcare today.

## 2018-04-14 NOTE — Progress Notes (Signed)
Pt's states no medical or surgical changes since previsit or office visit. 

## 2018-04-14 NOTE — Progress Notes (Signed)
A and O x3. Report to RN. Tolerated MAC anesthesia well.

## 2018-04-15 ENCOUNTER — Telehealth: Payer: Self-pay

## 2018-04-15 NOTE — Telephone Encounter (Signed)
No answering machine to leave message, busy signal, tried twice.

## 2018-04-19 ENCOUNTER — Encounter: Payer: Self-pay | Admitting: Gastroenterology

## 2018-05-19 ENCOUNTER — Encounter: Payer: Self-pay | Admitting: Vascular Surgery

## 2018-05-19 ENCOUNTER — Other Ambulatory Visit: Payer: Self-pay

## 2018-05-19 ENCOUNTER — Ambulatory Visit (INDEPENDENT_AMBULATORY_CARE_PROVIDER_SITE_OTHER): Payer: 59 | Admitting: Vascular Surgery

## 2018-05-19 ENCOUNTER — Ambulatory Visit (HOSPITAL_COMMUNITY)
Admission: RE | Admit: 2018-05-19 | Discharge: 2018-05-19 | Disposition: A | Discharge status: Home / Self Care | Payer: 59 | Source: Ambulatory Visit | Attending: Vascular Surgery | Admitting: Vascular Surgery

## 2018-05-19 VITALS — BP 138/95 | HR 65 | Temp 99.2°F | Resp 16 | Ht 68.0 in | Wt 221.0 lb

## 2018-05-19 DIAGNOSIS — I739 Peripheral vascular disease, unspecified: Secondary | ICD-10-CM | POA: Diagnosis present

## 2018-05-19 DIAGNOSIS — I70219 Atherosclerosis of native arteries of extremities with intermittent claudication, unspecified extremity: Secondary | ICD-10-CM | POA: Diagnosis not present

## 2018-05-19 NOTE — Progress Notes (Signed)
REASON FOR CONSULT:    Bilateral lower extremity claudication.  The consult is requested by Dr. Caffie Pinto.  HPI:   Alex Charles is a pleasant 52 y.o. male, who is referred with bilateral lower extremity claudication.  The patient noted the gradual onset of claudication in both calves approximately a year ago.  The symptoms have gradually progressed.  He experiences pain in the calf which is brought on by ambulation and relieved with rest.  His symptoms are more significant on the left side.  He denies significant thigh or hip claudication.  He works at [employer] and states that he experiences symptoms before he can walk the length of the store.  He also has a long history of chronic venous insufficiency with varicose veins bilaterally.  He is unaware of any previous history of DVT or phlebitis.  His risk factors for peripheral vascular disease include hyperlipidemia and tobacco use.  He currently smokes 10 cigarettes a day.  He is been smoking for many years.  He denies any history of diabetes, hypertension, or family history of premature cardiovascular disease.  Past Medical History:  Diagnosis Date  . Alcoholism in recovery [facility])    happened 4 years ago/ went thru detox in the hospital  . Allergy    seasonal  . Anxiety   . Blockage of coronary artery of heart ([place])    1 stent put in/ in 2015  . CHF (congestive heart failure) ([place])   . Depression   . Hyperlipidemia   . Prediabetes     Family History  Problem Relation Age of Onset  . Bladder Cancer Mother   . Crohn's disease Mother   . Hypertension Father   . Irritable bowel syndrome Sister     SOCIAL HISTORY: Social History   Socioeconomic History  . Marital status: Single    Spouse name: Not on file  . Number of children: Not on file  . Years of education: Not on file  . Highest education level: Not on file  Occupational History  . Not on file  Social Needs  . Financial resource strain: Not on file  . Food  insecurity:    Worry: Not on file    Inability: Not on file  . Transportation needs:    Medical: Not on file    Non-medical: Not on file  Tobacco Use  . Smoking status: Current Every Day Smoker    Packs/day: 0.50    Types: Cigarettes  . Smokeless tobacco: Never Used  Substance and Sexual Activity  . Alcohol use: No  . Drug use: No  . Sexual activity: Not on file  Lifestyle  . Physical activity:    Days per week: Not on file    Minutes per session: Not on file  . Stress: Not on file  Relationships  . Social connections:    Talks on phone: Not on file    Gets together: Not on file    Attends religious service: Not on file    Active member of club or organization: Not on file    Attends meetings of clubs or organizations: Not on file    Relationship status: Not on file  . Intimate partner violence:    Fear of current or ex partner: Not on file    Emotionally abused: Not on file    Physically abused: Not on file    Forced sexual activity: Not on file  Other Topics Concern  . Not on file  Social History Narrative  .  Not on file    No Known Allergies  Current Outpatient Medications  Medication Sig Dispense Refill  . acetaminophen (TYLENOL) 500 MG tablet Take 500 mg by mouth every 6 (six) hours as needed. Reported on 12/10/2015    . aspirin EC 81 MG tablet Take 1 tablet (81 mg total) by mouth daily. 30 tablet 11  . busPIRone (BUSPAR) 15 MG tablet Take 1 tablet (15 mg total) by mouth 2 (two) times daily. 180 tablet 1  . fenofibrate (TRICOR) 145 MG tablet TAKE 1 TABLET BY MOUTH EVERY DAY 90 tablet 1  . gabapentin (NEURONTIN) 300 MG capsule Take 1 capsule (300 mg total) by mouth 3 (three) times daily. 270 capsule 3  . ibuprofen (ADVIL,MOTRIN) 200 MG tablet Take 200 mg by mouth every 6 (six) hours as needed.     Current Facility-Administered Medications  Medication Dose Route Frequency Provider Last Rate Last Dose  . 0.9 %  sodium chloride infusion  500 mL Intravenous Once  Thornton Park, MD        REVIEW OF SYSTEMS:  [X]  denotes positive finding, [ ]  denotes negative finding Cardiac  Comments:  Chest pain or chest pressure:    Shortness of breath upon exertion:    Short of breath when lying flat:    Irregular heart rhythm:        Vascular    Pain in calf, thigh, or hip brought on by ambulation: x   Pain in feet at night that wakes you up from your sleep:     Blood clot in your veins:    Leg swelling:  x       Pulmonary    Oxygen at home:    Productive cough:     Wheezing:         Neurologic    Sudden weakness in arms or legs:     Sudden numbness in arms or legs:     Sudden onset of difficulty speaking or slurred speech:    Temporary loss of vision in one eye:     Problems with dizziness:         Gastrointestinal    Blood in stool:     Vomited blood:         Genitourinary    Burning when urinating:     Blood in urine:        Psychiatric    Major depression:         Hematologic    Bleeding problems:    Problems with blood clotting too easily:        Skin    Rashes or ulcers:        Constitutional    Fever or chills:     PHYSICAL EXAM:   Vitals:   05/19/18 1002  BP: (!) 138/95  Pulse: 65  Resp: 16  Temp: 99.2 F (37.3 C)  TempSrc: Oral  SpO2: 95%  Weight: 221 lb (100.2 kg)  Height: 5\' 8"  (1.727 m)   GENERAL: The patient is a well-nourished male, in no acute distress. The vital signs are documented above. CARDIAC: There is a regular rate and rhythm.  VASCULAR: I do not detect carotid bruits. He has palpable femoral pulses bilaterally. I cannot palpate popliteal or pedal pulses. He has no significant lower extremity swelling. He does have varicose veins of both lower extremities especially on the left side where they appear to be under significantly elevated pressure.  He does not have any significant hyperpigmentation or leg swelling  currently. PULMONARY: There is good air exchange bilaterally without wheezing or  rales. ABDOMEN: Soft and non-tender with normal pitched bowel sounds.  MUSCULOSKELETAL: There are no major deformities or cyanosis. NEUROLOGIC: No focal weakness or paresthesias are detected. SKIN: There are no ulcers or rashes noted. PSYCHIATRIC: The patient has a normal affect.  DATA:    ARTERIAL DOPPLER STUDY: I have independently interpreted his arterial Doppler study today.  On the right side he has a monophasic dorsalis pedis and posterior tibial signal.  ABI is 68%.  Toe pressure on the right is 39 mmHg.  On the left side he has a monophasic posterior tibial signal.  He does not have a dorsalis pedis signal.  ABI on the left is 55%.  Toe pressure on the left is 37 mmHg.   ASSESSMENT & PLAN:   BILATERAL INFRAINGUINAL ARTERIAL OCCLUSIVE DISEASE: This patient has claudication bilaterally which is more significant on the left side.  Based on his exam, he has evidence of infrainguinal arterial occlusive disease bilaterally.  I explained that normally for patients with claudication we try conservative treatment including a structured walking program and tobacco cessation.  I also explained, that if his symptoms are significantly disabling then certainly arteriography would be reasonable.  In addition if his symptoms progressed to the point of rest pain or nonhealing ulcer we would be much more aggressive with arteriography.  It seems that his symptoms are significantly disabling and I think it would be perfectly reasonable to proceed with arteriography.  I have discussed this with him in detail today. I have reviewed with the patient the indications for arteriography. In addition, I have reviewed the potential complications of arteriography including but not limited to: Bleeding, arterial injury, arterial thrombosis, dye action, renal insufficiency, or other unpredictable medical problems. I have explained to the patient that if we find disease amenable to angioplasty we could potentially address  this at the same time. I have discussed the potential complications of angioplasty and stenting, including but not limited to: Bleeding, arterial thrombosis, arterial injury, or dissection.  If the patient elects to proceed with arteriography then certainly will be happy to schedule this in the near future.  Otherwise I will plan on seeing him back in 9 months with follow-up ABIs.  CHRONIC VENOUS INSUFFICIENCY: The patient does have a history of chronic venous insufficiency and has CEAP clinical class II venous disease currently.  This is a secondary issue and he is not too concerned about this currently.  We will follow this as we follow his peripheral vascular disease.  Deitra Mayo [facility] [phone]

## 2018-08-18 ENCOUNTER — Encounter: Payer: Self-pay | Admitting: Family Medicine

## 2018-08-18 ENCOUNTER — Ambulatory Visit (INDEPENDENT_AMBULATORY_CARE_PROVIDER_SITE_OTHER): Payer: 59 | Admitting: Family Medicine

## 2018-08-18 VITALS — BP 137/76 | HR 80 | Temp 98.1°F | Resp 16 | Ht 68.0 in | Wt 221.0 lb

## 2018-08-18 DIAGNOSIS — I5022 Chronic systolic (congestive) heart failure: Secondary | ICD-10-CM

## 2018-08-18 DIAGNOSIS — G4700 Insomnia, unspecified: Secondary | ICD-10-CM | POA: Diagnosis not present

## 2018-08-18 DIAGNOSIS — E785 Hyperlipidemia, unspecified: Secondary | ICD-10-CM

## 2018-08-18 DIAGNOSIS — R7303 Prediabetes: Secondary | ICD-10-CM

## 2018-08-18 DIAGNOSIS — B353 Tinea pedis: Secondary | ICD-10-CM | POA: Diagnosis not present

## 2018-08-18 DIAGNOSIS — F419 Anxiety disorder, unspecified: Secondary | ICD-10-CM | POA: Diagnosis not present

## 2018-08-18 DIAGNOSIS — G629 Polyneuropathy, unspecified: Secondary | ICD-10-CM

## 2018-08-18 MED ORDER — TRAZODONE HCL 50 MG PO TABS: 50.0000 mg | ORAL_TABLET | Freq: Every evening | ORAL | 3 refills | Status: DC | PRN

## 2018-08-18 MED ORDER — HYDROXYZINE HCL 10 MG PO TABS: 10.0000 mg | ORAL_TABLET | Freq: Three times a day (TID) | ORAL | 1 refills | Status: DC | PRN

## 2018-08-18 MED ORDER — CLOTRIMAZOLE-BETAMETHASONE 1-0.05 % EX CREA: 1.0000 "application " | TOPICAL_CREAM | Freq: Two times a day (BID) | CUTANEOUS | 0 refills | Status: DC

## 2018-08-18 NOTE — Patient Instructions (Addendum)
Insomnia Insomnia is a sleep disorder that makes it difficult to fall asleep or stay asleep. Insomnia can cause fatigue, low energy, difficulty concentrating, mood swings, and poor performance at work or school. There are three different ways to classify insomnia:  Difficulty falling asleep.  Difficulty staying asleep.  Waking up too early in the morning. Any type of insomnia can be long-term (chronic) or short-term (acute). Both are common. Short-term insomnia usually lasts for three months or less. Chronic insomnia occurs at least three times a week for longer than three months. What are the causes? Insomnia may be caused by another condition, situation, or substance, such as:  Anxiety.  Certain medicines.  Gastroesophageal reflux disease (GERD) or other gastrointestinal conditions.  Asthma or other breathing conditions.  Restless legs syndrome, sleep apnea, or other sleep disorders.  Chronic pain.  Menopause.  Stroke.  Abuse of alcohol, tobacco, or illegal drugs.  Mental health conditions, such as depression.  Caffeine.  Neurological disorders, such as Alzheimer's disease.  An overactive thyroid (hyperthyroidism). Sometimes, the cause of insomnia may not be known. What increases the risk? Risk factors for insomnia include:  Gender. Women are affected more often than men.  Age. Insomnia is more common as you get older.  Stress.  Lack of exercise.  Irregular work schedule or working night shifts.  Traveling between different time zones.  Certain medical and mental health conditions. What are the signs or symptoms? If you have insomnia, the main symptom is having trouble falling asleep or having trouble staying asleep. This may lead to other symptoms, such as:  Feeling fatigued or having low energy.  Feeling nervous about going to sleep.  Not feeling rested in the morning.  Having trouble concentrating.  Feeling irritable, anxious, or depressed. How  is this diagnosed? This condition may be diagnosed based on:  Your symptoms and medical history. Your health care provider may ask about: ? Your sleep habits. ? Any medical conditions you have. ? Your mental health.  A physical exam. How is this treated? Treatment for insomnia depends on the cause. Treatment may focus on treating an underlying condition that is causing insomnia. Treatment may also include:  Medicines to help you sleep.  Counseling or therapy.  Lifestyle adjustments to help you sleep better. Follow these instructions at home: Eating and drinking   Limit or avoid alcohol, caffeinated beverages, and cigarettes, especially close to bedtime. These can disrupt your sleep.  Do not eat a large meal or eat spicy foods right before bedtime. This can lead to digestive discomfort that can make it hard for you to sleep. Sleep habits   Keep a sleep diary to help you and your health care provider figure out what could be causing your insomnia. Write down: ? When you sleep. ? When you wake up during the night. ? How well you sleep. ? How rested you feel the next day. ? Any side effects of medicines you are taking. ? What you eat and drink.  Make your bedroom a dark, comfortable place where it is easy to fall asleep. ? Put up shades or blackout curtains to block light from outside. ? Use a white noise machine to block noise. ? Keep the temperature cool.  Limit screen use before bedtime. This includes: ? Watching TV. ? Using your smartphone, tablet, or computer.  Stick to a routine that includes going to bed and waking up at the same times every day and night. This can help you fall asleep faster. Consider   making a quiet activity, such as reading, part of your nighttime routine.  Try to avoid taking naps during the day so that you sleep better at night.  Get out of bed if you are still awake after 15 minutes of trying to sleep. Keep the lights down, but try reading or  doing a quiet activity. When you feel sleepy, go back to bed. General instructions  Take over-the-counter and prescription medicines only as told by your health care provider.  Exercise regularly, as told by your health care provider. Avoid exercise starting several hours before bedtime.  Use relaxation techniques to manage stress. Ask your health care provider to suggest some techniques that may work well for you. These may include: ? Breathing exercises. ? Routines to release muscle tension. ? Visualizing peaceful scenes.  Make sure that you drive carefully. Avoid driving if you feel very sleepy.  Keep all follow-up visits as told by your health care provider. This is important. Contact a health care provider if:  You are tired throughout the day.  You have trouble in your daily routine due to sleepiness.  You continue to have sleep problems, or your sleep problems get worse. Get help right away if:  You have serious thoughts about hurting yourself or someone else. If you ever feel like you may hurt yourself or others, or have thoughts about taking your own life, get help right away. You can go to your nearest emergency department or call:  Your local emergency services (911 in the [place]  A suicide crisis helpline, such as the [facility] at 336-382-8413. This is open 24 hours a day. Summary  Insomnia is a sleep disorder that makes it difficult to fall asleep or stay asleep.  Insomnia can be long-term (chronic) or short-term (acute).  Treatment for insomnia depends on the cause. Treatment may focus on treating an underlying condition that is causing insomnia.  Keep a sleep diary to help you and your health care provider figure out what could be causing your insomnia. This information is not intended to replace advice given to you by your health care provider. Make sure you discuss any questions you have with your health care provider. Document  Released: 06/06/2000 Document Revised: 03/19/2017 Document Reviewed: 03/19/2017 Elsevier Interactive Patient Education  2019 [employer]  Athlete's foot (tinea pedis) is a fungal infection of the skin on your feet. It often occurs on the skin that is between or underneath your toes. It can also occur on the soles of your feet. Symptoms include itchy or white and flaky areas on the skin. The infection can spread from person to person (is contagious). It can also spread when a person's bare feet come in contact with the fungus on shower floors or on items such as shoes. Follow these instructions at home: Medicines  Apply or take over-the-counter and prescription medicines only as told by your doctor.  Apply your antifungal medicine as told by your doctor. Do not stop using the medicine even if your feet start to get better. Foot care  Do not scratch your feet.  Keep your feet dry: ? Wear cotton or wool socks. Change your socks every day or if they become wet. ? Wear shoes that allow air to move around, such as sandals or canvas tennis shoes.  Wash and dry your feet: ? Every day or as told by your doctor. ? After exercising. ? Including the area between your toes. General instructions  Do not  share any of these items that touch your feet: ? Towels. ? Shoes. ? Nail clippers. ? Other personal items.  Protect your feet by wearing sandals in wet areas, such as locker rooms and shared showers.  Keep all follow-up visits as told by your doctor. This is important.  If you have diabetes, keep your blood sugar under control. Contact a doctor if:  You have a fever.  You have swelling, pain, warmth, or redness in your foot.  Your feet are not getting better with treatment.  Your symptoms get worse.  You have new symptoms. Summary  Athlete's foot is a fungal infection of the skin on your feet.  Symptoms include itchy or white and flaky areas on the skin.  Apply  your antifungal medicine as told by your doctor.  Keep your feet clean and dry. This information is not intended to replace advice given to you by your health care provider. Make sure you discuss any questions you have with your health care provider. Document Released: 11/26/2007 Document Revised: 03/30/2017 Document Reviewed: 03/30/2017 Elsevier Interactive Patient Education  2019 [employer] Trazodone tablets What is this medicine? TRAZODONE (TRAZ oh done) is used to treat depression. This medicine may be used for other purposes; ask your health care provider or pharmacist if you have questions. COMMON BRAND NAME(S): Desyrel What should I tell my health care provider before I take this medicine? They need to know if you have any of these conditions: -attempted suicide or thinking about it -bipolar disorder -bleeding problems -glaucoma -heart disease, or previous heart attack -irregular heart beat -kidney or liver disease -low levels of sodium in the blood -an unusual or allergic reaction to trazodone, other medicines, foods, dyes or preservatives -pregnant or trying to get pregnant -breast-feeding How should I use this medicine? Take this medicine by mouth with a glass of water. Follow the directions on the prescription label. Take this medicine shortly after a meal or a light snack. Take your medicine at regular intervals. Do not take your medicine more often than directed. Do not stop taking this medicine suddenly except upon the advice of your doctor. Stopping this medicine too quickly may cause serious side effects or your condition may worsen. A special MedGuide will be given to you by the pharmacist with each prescription and refill. Be sure to read this information carefully each time. Talk to your pediatrician regarding the use of this medicine in children. Special care may be needed. Overdosage: If you think you have taken too much of this medicine contact a poison control  center or emergency room at once. NOTE: This medicine is only for you. Do not share this medicine with others. What if I miss a dose? If you miss a dose, take it as soon as you can. If it is almost time for your next dose, take only that dose. Do not take double or extra doses. What may interact with this medicine? Do not take this medicine with any of the following medications: -certain medicines for fungal infections like fluconazole, itraconazole, ketoconazole, posaconazole, voriconazole -cisapride -dofetilide -dronedarone -linezolid -MAOIs like Carbex, Eldepryl, Marplan, Nardil, and Parnate -mesoridazine -methylene blue (injected into a vein) -pimozide -saquinavir -thioridazine This medicine may also interact with the following medications: -alcohol -antiviral medicines for HIV or AIDS -aspirin and aspirin-like medicines -barbiturates like phenobarbital -certain medicines for blood pressure, heart disease, irregular heart beat -certain medicines for depression, anxiety, or psychotic disturbances -certain medicines for migraine headache like almotriptan, eletriptan, frovatriptan, naratriptan, rizatriptan,  sumatriptan, zolmitriptan -certain medicines for seizures like carbamazepine and phenytoin -certain medicines for sleep -certain medicines that treat or prevent blood clots like dalteparin, enoxaparin, warfarin -digoxin -fentanyl -lithium -NSAIDS, medicines for pain and inflammation, like ibuprofen or naproxen -other medicines that prolong the QT interval (cause an abnormal heart rhythm) -rasagiline -supplements like St. John's wort, kava kava, valerian -tramadol -tryptophan This list may not describe all possible interactions. Give your health care provider a list of all the medicines, herbs, non-prescription drugs, or dietary supplements you use. Also tell them if you smoke, drink alcohol, or use illegal drugs. Some items may interact with your medicine. What should I  watch for while using this medicine? Tell your doctor if your symptoms do not get better or if they get worse. Visit your doctor or health care professional for regular checks on your progress. Because it may take several weeks to see the full effects of this medicine, it is important to continue your treatment as prescribed by your doctor. Patients and their families should watch out for new or worsening thoughts of suicide or depression. Also watch out for sudden changes in feelings such as feeling anxious, agitated, panicky, irritable, hostile, aggressive, impulsive, severely restless, overly excited and hyperactive, or not being able to sleep. If this happens, especially at the beginning of treatment or after a change in dose, call your health care professional. Dennis Bast may get drowsy or dizzy. Do not drive, use machinery, or do anything that needs mental alertness until you know how this medicine affects you. Do not stand or sit up quickly, especially if you are an older patient. This reduces the risk of dizzy or fainting spells. Alcohol may interfere with the effect of this medicine. Avoid alcoholic drinks. This medicine may cause dry eyes and blurred vision. If you wear contact lenses you may feel some discomfort. Lubricating drops may help. See your eye doctor if the problem does not go away or is severe. Your mouth may get dry. Chewing sugarless gum, sucking hard candy and drinking plenty of water may help. Contact your doctor if the problem does not go away or is severe. What side effects may I notice from receiving this medicine? Side effects that you should report to your doctor or health care professional as soon as possible: -allergic reactions like skin rash, itching or hives, swelling of the face, lips, or tongue -elevated mood, decreased need for sleep, racing thoughts, impulsive behavior -confusion -fast, irregular heartbeat -feeling faint or lightheaded, falls -feeling agitated, angry, or  irritable -loss of balance or coordination -painful or prolonged erections -restlessness, pacing, inability to keep still -suicidal thoughts or other mood changes -tremors -trouble sleeping -seizures -unusual bleeding or bruising Side effects that usually do not require medical attention (report to your doctor or health care professional if they continue or are bothersome): -change in sex drive or performance -change in appetite or weight -constipation -headache -muscle aches or pains -nausea This list may not describe all possible side effects. Call your doctor for medical advice about side effects. You may report side effects to FDA at 1-800-FDA-1088. Where should I keep my medicine? Keep out of the reach of children. Store at room temperature between 15 and 30 degrees C (59 to 86 degrees F). Protect from light. Keep container tightly closed. Throw away any unused medicine after the expiration date. NOTE: This sheet is a summary. It may not cover all possible information. If you have questions about this medicine, talk to your doctor, pharmacist,  or health care provider.  2019 Elsevier/Gold Standard (2017-08-18 17:51:24)  Hydroxyzine capsules or tablets What is this medicine? HYDROXYZINE (hye [place] i zeen) is an antihistamine. This medicine is used to treat allergy symptoms. It is also used to treat anxiety and tension. This medicine can be used with other medicines to induce sleep before surgery. This medicine may be used for other purposes; ask your health care provider or pharmacist if you have questions. COMMON BRAND NAME(S): ANX, Atarax, Rezine, Vistaril What should I tell my health care provider before I take this medicine? They need to know if you have any of these conditions: -glaucoma -heart disease -history of irregular heartbeat -kidney disease -liver disease -lung or breathing disease, like asthma -stomach or intestine problems -thyroid disease -trouble passing  urine -an unusual or allergic reaction to hydroxyzine, cetirizine, other medicines, foods, dyes or preservatives -pregnant or trying to get pregnant -breast-feeding How should I use this medicine? Take this medicine by mouth with a full glass of water. Follow the directions on the prescription label. You may take this medicine with food or on an empty stomach. Take your medicine at regular intervals. Do not take your medicine more often than directed. Talk to your pediatrician regarding the use of this medicine in children. Special care may be needed. While this drug may be prescribed for children as young as 104 years of age for selected conditions, precautions do apply. Patients over 70 years old may have a stronger reaction and need a smaller dose. Overdosage: If you think you have taken too much of this medicine contact a poison control center or emergency room at once. NOTE: This medicine is only for you. Do not share this medicine with others. What if I miss a dose? If you miss a dose, take it as soon as you can. If it is almost time for your next dose, take only that dose. Do not take double or extra doses. What may interact with this medicine? Do not take this medicine with any of the following medications: -cisapride -dofetilide -dronedarone -pimozide -thioridazine This medicine may also interact with the following medications: -alcohol -antihistamines for allergy, cough, and cold -atropine -barbiturate medicines for sleep or seizures, like phenobarbital -certain antibiotics like erythromycin or clarithromycin -certain medicines for anxiety or sleep -certain medicines for bladder problems like oxybutynin, tolterodine -certain medicines for depression or psychotic disturbances -certain medicines for irregular heart beat -certain medicines for Parkinson's disease like benztropine, trihexyphenidyl -certain medicines for seizures like phenobarbital, primidone -certain medicines for  stomach problems like dicyclomine, hyoscyamine -certain medicines for travel sickness like scopolamine -ipratropium -narcotic medicines for pain -other medicines that prolong the QT interval (an abnormal heart rhythm) This list may not describe all possible interactions. Give your health care provider a list of all the medicines, herbs, non-prescription drugs, or dietary supplements you use. Also tell them if you smoke, drink alcohol, or use illegal drugs. Some items may interact with your medicine. What should I watch for while using this medicine? Tell your doctor or health care professional if your symptoms do not improve. You may get drowsy or dizzy. Do not drive, use machinery, or do anything that needs mental alertness until you know how this medicine affects you. Do not stand or sit up quickly, especially if you are an older patient. This reduces the risk of dizzy or fainting spells. Alcohol may interfere with the effect of this medicine. Avoid alcoholic drinks. Your mouth may get dry. Chewing sugarless gum or sucking hard  candy, and drinking plenty of water may help. Contact your doctor if the problem does not go away or is severe. This medicine may cause dry eyes and blurred vision. If you wear contact lenses you may feel some discomfort. Lubricating drops may help. See your eye doctor if the problem does not go away or is severe. If you are receiving skin tests for allergies, tell your doctor you are using this medicine. What side effects may I notice from receiving this medicine? Side effects that you should report to your doctor or health care professional as soon as possible: -allergic reactions like skin rash, itching or hives, swelling of the face, lips, or tongue -changes in vision -confusion -fast, irregular heartbeat -seizures -tremor -trouble passing urine or change in the amount of urine Side effects that usually do not require medical attention (report to your doctor or  health care professional if they continue or are bothersome): -constipation -drowsiness -dry mouth -headache -tiredness This list may not describe all possible side effects. Call your doctor for medical advice about side effects. You may report side effects to FDA at 1-800-FDA-1088. Where should I keep my medicine? Keep out of the reach of children. Store at room temperature between 15 and 30 degrees C (59 and 86 degrees F). Keep container tightly closed. Throw away any unused medicine after the expiration date. NOTE: This sheet is a summary. It may not cover all possible information. If you have questions about this medicine, talk to your doctor, pharmacist, or health care provider.  2019 Elsevier/Gold Standard (2017-12-21 13:25:13)

## 2018-08-18 NOTE — Progress Notes (Signed)
Patient [facility]   Progress Note: General Provider: Lanae Boast, FNP  SUBJECTIVE:   Alex Charles is a 53 y.o. male who  has a past medical history of Alcoholism in recovery ([place]), Allergy, Anxiety, Blockage of coronary artery of heart ([place]), CHF (congestive heart failure) ([place]), Depression, Hyperlipidemia, and Prediabetes.. Patient presents today for Follow-up (6 month follow up); Depression; and Eczema (on legs/feet ) Patient presents with a rash on the bottom of his right foot times several weeks.  He is placed Aquaphor to the area without relief.   Patient also states that his anxiety has increased.  He does not see a psychiatrist or counselor at the present time.  He states that his depression is under control.  He is working on managing stress and becoming more emotionally intelligent.  He denies suicidal ideation, intent, or plan.  He denies psychosis or delusions.   He states that he has difficulty with staying asleep.  He is working on improving his overall wellbeing.  He states that he is eating in well-balanced diet and is walking as tolerated.   He was seen by vascular due to claudication and instructed to do more walking and to work on weight loss.  He states that he is compliant with his medications.  He denies side effects. Review of Systems  Constitutional: Negative.   HENT: Negative.   Eyes: Negative.   Respiratory: Negative.   Cardiovascular: Positive for claudication.  Gastrointestinal: Negative.   Genitourinary: Negative.   Musculoskeletal: Negative.   Skin: Positive for rash.  Neurological: Negative.   Psychiatric/Behavioral: The patient is nervous/anxious and has insomnia.      OBJECTIVE: BP 137/76 (BP Location: Left Arm, Patient Position: Sitting, Cuff Size: Normal)   Pulse 80   Temp 98.1 F (36.7 C) (Oral)   Resp 16   Ht 5\' 8"  (1.727 m)   Wt 221 lb (100.2 kg)   SpO2 97%   BMI 33.60 kg/m   Wt Readings from  Last 3 Encounters:  08/18/18 221 lb (100.2 kg)  05/19/18 221 lb (100.2 kg)  04/14/18 220 lb (99.8 kg)     Physical Exam Vitals signs and nursing note reviewed.  Constitutional:      General: He is not in acute distress.    Appearance: He is well-developed.  HENT:     Head: Normocephalic and atraumatic.  Eyes:     Extraocular Movements: Extraocular movements intact.     Conjunctiva/sclera: Conjunctivae normal.     Pupils: Pupils are equal, round, and reactive to light.  Neck:     Musculoskeletal: Normal range of motion.  Cardiovascular:     Rate and Rhythm: Normal rate and regular rhythm.     Heart sounds: Normal heart sounds.  Pulmonary:     Effort: Pulmonary effort is normal. No respiratory distress.     Breath sounds: Normal breath sounds.  Abdominal:     General: Bowel sounds are normal. There is no distension.     Palpations: Abdomen is soft.  Musculoskeletal: Normal range of motion.  Skin:    General: Skin is warm and dry.     Findings: Erythema and rash present. Rash is papular (located on plantar surface of the right foot. Sporadic distribution) and scaling.  Neurological:     Mental Status: He is alert and oriented to person, place, and time.  Psychiatric:        Mood and Affect: Mood normal.  Behavior: Behavior normal.        Thought Content: Thought content normal.        Judgment: Judgment normal.     ASSESSMENT/PLAN: 1. Hyperlipidemia, unspecified hyperlipidemia type - Lipid Panel With LDL/HDL Ratio; Future  2. Tinea pedis of right foot - clotrimazole-betamethasone (LOTRISONE) cream; Apply 1 application topically 2 (two) times daily.  Dispense: 30 g; Refill: 0  3. Anxiety - hydrOXYzine (ATARAX/VISTARIL) 10 MG tablet; Take 1 tablet (10 mg total) by mouth 3 (three) times daily as needed.  Dispense: 90 tablet; Refill: 1 - TSH; Future  4. Insomnia, unspecified type - traZODone (DESYREL) 50 MG tablet; Take 1 tablet (50 mg total) by mouth at bedtime  as needed for sleep.  Dispense: 30 tablet; Refill: 3 - TSH; Future  5. Chronic systolic heart failure ([place])  6. Prediabetes - CBC With Differential; Future - Comprehensive metabolic panel; Future  7. Neuropathy  Summary: Patient to return for fasting labs.  Orders placed.  Lotrisone cream for tinea pedis.  Discussed foot hygiene and care.  Discussed changing socks on a regular basis and airing out shoes. Start hydroxyzine 10 mg 1 pill by mouth 3 times a day as needed for anxiety.  Trazodone added for insomnia.  Discussed benefits and risk of medication. Praised patient for exercise efforts and encouraged to continue.  Will return to care in 6 weeks due to start of new medications.  Return in about 6 weeks (around 09/29/2018) for New anxiety med.    The patient was given clear instructions to go to ER or return to medical center if symptoms do not improve, worsen or new problems develop. The patient verbalized understanding and agreed with plan of care.   Ms. Doug Sou. Nathaneil Canary, FNP-BC Patient [facility] [address] [place], [place] [address] [phone]

## 2018-09-09 ENCOUNTER — Other Ambulatory Visit: Payer: Self-pay | Admitting: Family Medicine

## 2018-09-09 DIAGNOSIS — G629 Polyneuropathy, unspecified: Secondary | ICD-10-CM

## 2018-09-10 ENCOUNTER — Other Ambulatory Visit: Payer: Self-pay | Admitting: Family Medicine

## 2018-09-10 DIAGNOSIS — G4700 Insomnia, unspecified: Secondary | ICD-10-CM

## 2018-09-10 DIAGNOSIS — F419 Anxiety disorder, unspecified: Secondary | ICD-10-CM

## 2018-09-22 ENCOUNTER — Other Ambulatory Visit: Payer: Self-pay

## 2018-09-22 ENCOUNTER — Other Ambulatory Visit: Payer: 59

## 2018-09-22 DIAGNOSIS — G4700 Insomnia, unspecified: Secondary | ICD-10-CM

## 2018-09-22 DIAGNOSIS — E785 Hyperlipidemia, unspecified: Secondary | ICD-10-CM

## 2018-09-22 DIAGNOSIS — F419 Anxiety disorder, unspecified: Secondary | ICD-10-CM

## 2018-09-22 DIAGNOSIS — R7303 Prediabetes: Secondary | ICD-10-CM

## 2018-09-23 LAB — COMPREHENSIVE METABOLIC PANEL
ALT: 35 IU/L (ref 0–44)
AST: 30 IU/L (ref 0–40)
Albumin/Globulin Ratio: 2.2 (ref 1.2–2.2)
Albumin: 4.8 g/dL (ref 3.8–4.9)
Alkaline Phosphatase: 53 IU/L (ref 39–117)
BUN/Creatinine Ratio: 14 (ref 9–20)
BUN: 17 mg/dL (ref 6–24)
Bilirubin Total: 0.2 mg/dL (ref 0.0–1.2)
CO2: 22 mmol/L (ref 20–29)
Calcium: 9.9 mg/dL (ref 8.7–10.2)
Chloride: 98 mmol/L (ref 96–106)
Creatinine, Ser: 1.2 mg/dL (ref 0.76–1.27)
GFR calc Af Amer: 79 mL/min/{1.73_m2} (ref >59)
GFR calc non Af Amer: 68 mL/min/{1.73_m2} (ref >59)
Globulin, Total: 2.2 g/dL (ref 1.5–4.5)
Glucose: 97 mg/dL (ref 65–99)
Potassium: 4.5 mmol/L (ref 3.5–5.2)
Sodium: 139 mmol/L (ref 134–144)
Total Protein: 7 g/dL (ref 6.0–8.5)

## 2018-09-23 LAB — CBC WITH DIFFERENTIAL
Basophils Absolute: 0.1 10*3/uL (ref 0.0–0.2)
Basos: 1 %
EOS (ABSOLUTE): 0.2 10*3/uL (ref 0.0–0.4)
Eos: 2 %
Hematocrit: 50 % (ref 37.5–51.0)
Hemoglobin: 17 g/dL (ref 13.0–17.7)
Immature Grans (Abs): 0 10*3/uL (ref 0.0–0.1)
Immature Granulocytes: 0 %
Lymphocytes Absolute: 3.1 10*3/uL (ref 0.7–3.1)
Lymphs: 44 %
MCH: 32 pg (ref 26.6–33.0)
MCHC: 34 g/dL (ref 31.5–35.7)
MCV: 94 fL (ref 79–97)
Monocytes Absolute: 0.7 10*3/uL (ref 0.1–0.9)
Monocytes: 9 %
Neutrophils Absolute: 3.1 10*3/uL (ref 1.4–7.0)
Neutrophils: 44 %
RBC: 5.32 x10E6/uL (ref 4.14–5.80)
RDW: 12.9 % (ref 11.6–15.4)
WBC: 7.1 10*3/uL (ref 3.4–10.8)

## 2018-09-23 LAB — LIPID PANEL WITH LDL/HDL RATIO
Cholesterol, Total: 253 mg/dL — ABNORMAL HIGH (ref 100–199)
HDL: 36 mg/dL — ABNORMAL LOW (ref >39)
LDL Calculated: 170 mg/dL — ABNORMAL HIGH (ref 0–99)
LDl/HDL Ratio: 4.7 ratio — ABNORMAL HIGH (ref 0.0–3.6)
Triglycerides: 235 mg/dL — ABNORMAL HIGH (ref 0–149)
VLDL Cholesterol Cal: 47 mg/dL — ABNORMAL HIGH (ref 5–40)

## 2018-09-23 LAB — TSH: TSH: 3.12 u[IU]/mL (ref 0.450–4.500)

## 2018-09-29 ENCOUNTER — Other Ambulatory Visit: Payer: Self-pay

## 2018-09-29 ENCOUNTER — Ambulatory Visit (INDEPENDENT_AMBULATORY_CARE_PROVIDER_SITE_OTHER): Payer: 59 | Admitting: Family Medicine

## 2018-09-29 DIAGNOSIS — F411 Generalized anxiety disorder: Secondary | ICD-10-CM

## 2018-09-29 DIAGNOSIS — I5022 Chronic systolic (congestive) heart failure: Secondary | ICD-10-CM | POA: Diagnosis not present

## 2018-09-29 DIAGNOSIS — E785 Hyperlipidemia, unspecified: Secondary | ICD-10-CM

## 2018-09-29 NOTE — Progress Notes (Signed)
  [name]  Virtual Visit via Telephone Note  I connected with Alex Charles on 09/29/18 at 11:00 AM EDT by telephone and verified that I am speaking with the correct person using two identifiers.   I discussed the limitations, risks, security and privacy concerns of performing an evaluation and management service by telephone and the availability of in person appointments. I also discussed with the patient that there may be a patient responsible charge related to this service. The patient expressed understanding and agreed to proceed.   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 He reports that the job is physically more demanding and he is having increased foot and leg pain due to this. He states that he is anxious about COVID 19 as well. He denies chest pain, SOB, dizziness or leg swelling. Reports compliance with medications. Denies side effects of medications at the present time.     Observations/Objective: Patient with regular voice tone, rate and rhythm. Speaking calmly and is in no apparent distress.     Assessment and Plan: 1. Generalized anxiety disorder We discussed continuing with meditation practices and limiting exposure to COVID related social media and news. Continue with buspar, trazodone and gabapentin.   2. Chronic systolic heart failure (HCC) No medication changes warranted at the present time.    3. Hyperlipidemia LDL goal <100 Continue with diet and exercise. No medication changes warranted at the present time.     Follow Up Instructions:   We discussed hand washing, using hand sanitizer when soap and water are not available, only going out when absolutely necessary, and social distancing. Explained to patient that he is immunocompromised and will need to take precautions during this  time.   I discussed the assessment and treatment plan with the patient. The patient was provided an opportunity to ask questions and all were answered. The patient agreed with the plan and demonstrated an understanding of the instructions.   The patient was advised to call back or seek an in-person evaluation if the symptoms worsen or if the condition fails to improve as anticipated.  I provided 15 minutes of non-face-to-face time during this encounter.  Ms. Andr L. Nathaneil Canary, FNP-BC Patient Alex [facility] [address] [place], [place] [address] [phone]

## 2018-10-11 ENCOUNTER — Encounter: Payer: Self-pay | Admitting: Family Medicine

## 2018-12-02 ENCOUNTER — Other Ambulatory Visit: Payer: Self-pay | Admitting: Family Medicine

## 2018-12-02 DIAGNOSIS — F411 Generalized anxiety disorder: Secondary | ICD-10-CM

## 2018-12-02 DIAGNOSIS — F32A Depression, unspecified: Secondary | ICD-10-CM

## 2018-12-02 DIAGNOSIS — F329 Major depressive disorder, single episode, unspecified: Secondary | ICD-10-CM

## 2018-12-29 ENCOUNTER — Ambulatory Visit: Payer: Self-pay | Admitting: Family Medicine

## 2019-01-19 ENCOUNTER — Encounter: Payer: Self-pay | Admitting: Family Medicine

## 2019-01-19 ENCOUNTER — Other Ambulatory Visit: Payer: Self-pay

## 2019-01-19 ENCOUNTER — Ambulatory Visit (INDEPENDENT_AMBULATORY_CARE_PROVIDER_SITE_OTHER): Payer: 59 | Admitting: Family Medicine

## 2019-01-19 VITALS — BP 124/82 | HR 82 | Temp 98.7°F | Resp 16 | Ht 68.0 in | Wt 213.0 lb

## 2019-01-19 DIAGNOSIS — F17200 Nicotine dependence, unspecified, uncomplicated: Secondary | ICD-10-CM

## 2019-01-19 DIAGNOSIS — F411 Generalized anxiety disorder: Secondary | ICD-10-CM

## 2019-01-19 DIAGNOSIS — E781 Pure hyperglyceridemia: Secondary | ICD-10-CM | POA: Diagnosis not present

## 2019-01-19 MED ORDER — FENOFIBRATE 145 MG PO TABS: 145.0000 mg | ORAL_TABLET | Freq: Every day | ORAL | 2 refills | Status: DC

## 2019-01-19 NOTE — Progress Notes (Signed)
[facility]   Progress Note: General Provider: Lanae Boast, FNP  SUBJECTIVE:   Alex Charles is a 53 y.o. male who  has a past medical history of Alcoholism in recovery ([place]), Allergy, Anxiety, Blockage of coronary artery of heart ([place]), CHF (congestive heart failure) ([place]), Depression, Hyperlipidemia, and Prediabetes.. Patient presents today for Follow-up (Generalized anxiety disorder) and Hyperlipidemia  Patient states that his anxiety is better controlled and he is using holistic practices such as meditation to aid in this. He states that he is exercising through biking, kayaking, and walking daily. He states that he is feeling, sleeping and eating better.    Review of Systems  Constitutional: Negative.   HENT: Negative.   Eyes: Negative.   Respiratory: Negative.   Cardiovascular: Negative.   Gastrointestinal: Negative.   Genitourinary: Negative.   Musculoskeletal: Negative.   Skin: Negative.   Neurological: Negative.   Psychiatric/Behavioral: Negative.      OBJECTIVE: BP 124/82 (BP Location: Right Arm, Patient Position: Sitting, Cuff Size: Large)   Pulse 82   Temp 98.7 F (37.1 C) (Oral)   Resp 16   Ht 5\' 8"  (1.727 m)   Wt 213 lb (96.6 kg)   SpO2 100%   BMI 32.39 kg/m   Wt Readings from Last 3 Encounters:  01/19/19 213 lb (96.6 kg)  08/18/18 221 lb (100.2 kg)  05/19/18 221 lb (100.2 kg)     Physical Exam Vitals signs and nursing note reviewed.  Constitutional:      General: He is not in acute distress.    Appearance: Normal appearance.  HENT:     Head: Normocephalic and atraumatic.  Eyes:     Extraocular Movements: Extraocular movements intact.     Conjunctiva/sclera: Conjunctivae normal.     Pupils: Pupils are equal, round, and reactive to light.  Cardiovascular:     Rate and Rhythm: Normal rate and regular rhythm.     Heart sounds: No murmur.  Pulmonary:     Effort: Pulmonary effort is normal.   Breath sounds: Normal breath sounds.  Musculoskeletal: Normal range of motion.  Skin:    General: Skin is warm and dry.  Neurological:     Mental Status: He is alert and oriented to person, place, and time.  Psychiatric:        Mood and Affect: Mood normal.        Behavior: Behavior normal.        Thought Content: Thought content normal.        Judgment: Judgment normal.     ASSESSMENT/PLAN:  1. Hypertriglyceridemia No medication changes warranted at the present time.   - fenofibrate (TRICOR) 145 MG tablet; Take 1 tablet (145 mg total) by mouth daily.  Dispense: 90 tablet; Refill: 2  2. Generalized anxiety disorder Continue with current mediations and practices. Letter written for patient to remove mask for five minutes every 2-3 hours due to his anxiety. Also suggested menthol candy during mask use to help with confined feelings.   3. Tobacco dependence Patient has cut back but has not ceased. Smoking cessation instruction/counseling given:  counseled patient on the dangers of tobacco use, advised patient to stop smoking, and reviewed strategies to maximize success    Return in about 3 months (around 04/21/2019) for fasting labs.    The patient was given clear instructions to go to ER or return to medical center if symptoms do not improve, worsen or new problems develop. The patient verbalized understanding  and agreed with plan of care.   Ms. Doug Sou. Nathaneil Canary, FNP-BC Patient [facility] [address] [place], [place] [address] [phone]

## 2019-01-19 NOTE — Patient Instructions (Signed)
High Triglycerides Eating Plan Triglycerides are a type of fat in the blood. High levels of triglycerides can increase your risk of heart disease and stroke. If your triglyceride levels are high, choosing the right foods can help lower your triglycerides and keep your heart healthy. Work with your health care provider or a diet and nutrition specialist (dietitian) to develop an eating plan that is right for you. What are tips for following this plan? General guidelines   Lose weight, if you are overweight. For most people, losing 5-10 lbs (2-5 kg) helps lower triglyceride levels. A weight-loss plan may include. ? 30 minutes of exercise at least 5 days a week. ? Reducing the amount of calories, sugar, and fat you eat.  Eat a wide variety of fresh fruits, vegetables, and whole grains. These foods are high in fiber.  Eat foods that contain healthy fats, such as fatty fish, nuts, seeds, and olive oil.  Avoid foods that are high in added sugar, added salt (sodium), saturated fat, and trans fat.  Avoid low-fiber, refined carbohydrates such as white bread, crackers, noodles, and white rice.  Avoid foods with partially hydrogenated oils (trans fats), such as fried foods or stick margarine.  Limit alcohol intake to no more than 1 drink a day for nonpregnant women and 2 drinks a day for men. One drink equals 12 oz of beer, 5 oz of wine, or 1 oz of hard liquor. Your health care provider may recommend that you drink less depending on your overall health. Reading food labels  Check food labels for the amount of saturated fat. Choose foods with no or very little saturated fat.  Check food labels for the amount of trans fat. Choose foods with no trans fat.  Check food labels for the amount of cholesterol. Choose foods low in cholesterol. Ask your dietitian how much cholesterol you should have each day.  Check food labels for the amount of sodium. Choose foods with less than 140 milligrams (mg) per  serving. Shopping  Buy dairy products labeled as nonfat (skim) or low-fat (1%).  Avoid buying processed or prepackaged foods. These are often high in added sugar, sodium, and fat. Cooking  Choose healthy fats when cooking, such as olive oil or canola oil.  Cook foods using lower fat methods, such as baking, broiling, boiling, or grilling.  Make your own sauces, dressings, and marinades when possible, instead of buying them. Store-bought sauces, dressings, and marinades are often high in sodium and sugar. Meal planning  Eat more home-cooked food and less restaurant, buffet, and fast food.  Eat fatty fish at least 2 times each week. Examples of fatty fish include salmon, trout, mackerel, tuna, and herring.  If you eat whole eggs, do not eat more than 3 egg yolks per week. What foods are recommended? The items listed may not be a complete list. Talk with your dietitian about what dietary choices are best for you. Grains Whole wheat or whole grain breads, crackers, cereals, and pasta. Unsweetened oatmeal. Bulgur. Barley. Quinoa. Brown rice. Whole wheat flour tortillas. Vegetables Fresh or frozen vegetables. Low-sodium canned vegetables. Fruits All fresh, canned (in natural juice), or frozen fruits. Meats and other protein foods Skinless chicken or turkey. Ground chicken or turkey. Lean cuts of pork, trimmed of fat. Fish and seafood, especially salmon, trout, and herring. Egg whites. Dried beans, peas, or lentils. Unsalted nuts or seeds. Unsalted canned beans. Natural peanut or almond butter. Dairy Low-fat dairy products. Skim or low-fat (1%) milk. Reduced fat (  2%) and low-sodium cheese. Low-fat ricotta cheese. Low-fat cottage cheese. Plain, low-fat yogurt. Fats and oils Tub margarine without trans fats. Light or reduced-fat mayonnaise. Light or reduced-fat salad dressings. Avocado. Safflower, olive, sunflower, soybean, and canola oils. What foods are not recommended? The items listed  may not be a complete list. Talk with your dietitian about what dietary choices are best for you. Grains White bread. White (regular) pasta. White rice. Cornbread. Bagels. Pastries. Crackers that contain trans fat. Vegetables Creamed or fried vegetables. Vegetables in a cheese sauce. Fruits Sweetened dried fruit. Canned fruit in syrup. Fruit juice. Meats and other protein foods Fatty cuts of meat. Ribs. Chicken wings. Berniece Salines. Sausage. Bologna. Salami. Chitterlings. Fatback. Hot dogs. Bratwurst. Packaged lunch meats. Dairy Whole or reduced-fat (2%) milk. Half-and-half. Cream cheese. Full-fat or sweetened yogurt. Full-fat cheese. Nondairy creamers. Whipped toppings. Processed cheese or cheese spreads. Cheese curds. Beverages Alcohol. Sweetened drinks, such as soda, lemonade, fruit drinks, or punches. Fats and oils Butter. Stick margarine. Lard. Shortening. Ghee. Bacon fat. Tropical oils, such as coconut, palm kernel, or palm oils. Sweets and desserts Corn syrup. Sugars. Honey. Molasses. Candy. Jam and jelly. Syrup. Sweetened cereals. Cookies. Pies. Cakes. Donuts. Muffins. Ice cream. Condiments Store-bought sauces, dressings, and marinades that are high in sugar, such as ketchup and barbecue sauce. Summary  High levels of triglycerides can increase the risk of heart disease and stroke. Choosing the right foods can help lower your triglycerides.  Eat plenty of fresh fruits, vegetables, and whole grains. Choose low-fat dairy and lean meats. Eat fatty fish at least twice a week.  Avoid processed and prepackaged foods with added sugar, sodium, saturated fat, and trans fat.  If you need suggestions or have questions about what types of food are good for you, talk with your health care provider or a dietitian. This information is not intended to replace advice given to you by your health care provider. Make sure you discuss any questions you have with your health care provider. Document Released:  03/27/2004 Document Revised: 05/22/2017 Document Reviewed: 08/12/2016 Elsevier Patient Education  2020 [place]  After being diagnosed with an anxiety disorder, you may be relieved to know why you have felt or behaved a certain way. It is natural to also feel overwhelmed about the treatment ahead and what it will mean for your life. With care and support, you can manage this condition and recover from it. How to cope with anxiety Dealing with stress Stress is your body's reaction to life changes and events, both good and bad. Stress can last just a few hours or it can be ongoing. Stress can play a major role in anxiety, so it is important to learn both how to cope with stress and how to think about it differently. Talk with your health care provider or a counselor to learn more about stress reduction. He or she may suggest some stress reduction techniques, such as:  Music therapy. This can include creating or listening to music that you enjoy and that inspires you.  Mindfulness-based meditation. This involves being aware of your normal breaths, rather than trying to control your breathing. It can be done while sitting or walking.  Centering prayer. This is a kind of meditation that involves focusing on a word, phrase, or sacred image that is meaningful to you and that brings you peace.  Deep breathing. To do this, expand your stomach and inhale slowly through your nose. Hold your breath for 3-5 seconds. Then exhale slowly,  allowing your stomach muscles to relax.  Self-talk. This is a skill where you identify thought patterns that lead to anxiety reactions and correct those thoughts.  Muscle relaxation. This involves tensing muscles then relaxing them. Choose a stress reduction technique that fits your lifestyle and personality. Stress reduction techniques take time and practice. Set aside 5-15 minutes a day to do them. Therapists can offer training in these techniques.  The training may be covered by some insurance plans. Other things you can do to manage stress include:  Keeping a stress diary. This can help you learn what triggers your stress and ways to control your response.  Thinking about how you respond to certain situations. You may not be able to control everything, but you can control your reaction.  Making time for activities that help you relax, and not feeling guilty about spending your time in this way. Therapy combined with coping and stress-reduction skills provides the best chance for successful treatment. Medicines Medicines can help ease symptoms. Medicines for anxiety include:  Anti-anxiety drugs.  Antidepressants.  Beta-blockers. Medicines may be used as the main treatment for anxiety disorder, along with therapy, or if other treatments are not working. Medicines should be prescribed by a health care provider. Relationships Relationships can play a big part in helping you recover. Try to spend more time connecting with trusted friends and family members. Consider going to couples counseling, taking family education classes, or going to family therapy. Therapy can help you and others better understand the condition. How to recognize changes in your condition Everyone has a different response to treatment for anxiety. Recovery from anxiety happens when symptoms decrease and stop interfering with your daily activities at home or work. This may mean that you will start to:  Have better concentration and focus.  Sleep better.  Be less irritable.  Have more energy.  Have improved memory. It is important to recognize when your condition is getting worse. Contact your health care provider if your symptoms interfere with home or work and you do not feel like your condition is improving. Where to find help and support: You can get help and support from these sources:  Self-help groups.  Online and [employer].  A trusted  spiritual leader.  Couples counseling.  Family education classes.  Family therapy. Follow these instructions at home:  Eat a healthy diet that includes plenty of vegetables, fruits, whole grains, low-fat dairy products, and lean protein. Do not eat a lot of foods that are high in solid fats, added sugars, or salt.  Exercise. Most adults should do the following: ? Exercise for at least 150 minutes each week. The exercise should increase your heart rate and make you sweat (moderate-intensity exercise). ? Strengthening exercises at least twice a week.  Cut down on caffeine, tobacco, alcohol, and other potentially harmful substances.  Get the right amount and quality of sleep. Most adults need 7-9 hours of sleep each night.  Make choices that simplify your life.  Take over-the-counter and prescription medicines only as told by your health care provider.  Avoid caffeine, alcohol, and certain over-the-counter cold medicines. These may make you feel worse. Ask your pharmacist which medicines to avoid.  Keep all follow-up visits as told by your health care provider. This is important. Questions to ask your health care provider  Would I benefit from therapy?  How often should I follow up with a health care provider?  How long do I need to take medicine?  Are there  any long-term side effects of my medicine?  Are there any alternatives to taking medicine? Contact a health care provider if:  You have a hard time staying focused or finishing daily tasks.  You spend many hours a day feeling worried about everyday life.  You become exhausted by worry.  You start to have headaches, feel tense, or have nausea.  You urinate more than normal.  You have diarrhea. Get help right away if:  You have a racing heart and shortness of breath.  You have thoughts of hurting yourself or others. If you ever feel like you may hurt yourself or others, or have thoughts about taking your own  life, get help right away. You can go to your nearest emergency department or call:  Your local emergency services (911 in the [place]  A suicide crisis helpline, such as the [facility] at (828) 661-9902. This is open 24-hours a day. Summary  Taking steps to deal with stress can help calm you.  Medicines cannot cure anxiety disorders, but they can help ease symptoms.  Family, friends, and partners can play a big part in helping you recover from an anxiety disorder. This information is not intended to replace advice given to you by your health care provider. Make sure you discuss any questions you have with your health care provider. Document Released: 06/03/2016 Document Revised: 05/22/2017 Document Reviewed: 06/03/2016 Elsevier Patient Education  2020 [employer].

## 2019-03-02 ENCOUNTER — Encounter (HOSPITAL_COMMUNITY): Payer: Self-pay | Admitting: *Deleted

## 2019-03-02 ENCOUNTER — Encounter (HOSPITAL_COMMUNITY): Payer: Self-pay

## 2019-03-02 ENCOUNTER — Other Ambulatory Visit: Payer: Self-pay | Admitting: Family Medicine

## 2019-03-02 DIAGNOSIS — F419 Anxiety disorder, unspecified: Secondary | ICD-10-CM

## 2019-04-12 ENCOUNTER — Other Ambulatory Visit: Payer: Self-pay | Admitting: Family Medicine

## 2019-04-12 DIAGNOSIS — F32A Depression, unspecified: Secondary | ICD-10-CM

## 2019-04-12 DIAGNOSIS — F411 Generalized anxiety disorder: Secondary | ICD-10-CM

## 2019-04-12 DIAGNOSIS — F329 Major depressive disorder, single episode, unspecified: Secondary | ICD-10-CM

## 2019-04-13 NOTE — Telephone Encounter (Signed)
Refill request: Buspirone 15 MG

## 2019-04-27 ENCOUNTER — Ambulatory Visit: Payer: Self-pay | Admitting: Family Medicine

## 2019-05-18 ENCOUNTER — Ambulatory Visit: Payer: Self-pay | Admitting: Family Medicine

## 2019-06-09 ENCOUNTER — Other Ambulatory Visit: Payer: Self-pay | Admitting: Family Medicine

## 2019-06-09 DIAGNOSIS — G4700 Insomnia, unspecified: Secondary | ICD-10-CM

## 2019-06-09 NOTE — Telephone Encounter (Signed)
Former Venora Maples NP patient. Scheduled to see you on 07/04/2019. Requesting TRAZODONE 50 MG TABLET refill.

## 2019-07-04 ENCOUNTER — Ambulatory Visit: Payer: Self-pay | Admitting: Family Medicine

## 2019-09-01 ENCOUNTER — Other Ambulatory Visit: Payer: Self-pay | Admitting: Family Medicine

## 2019-09-01 DIAGNOSIS — G629 Polyneuropathy, unspecified: Secondary | ICD-10-CM

## 2019-09-01 DIAGNOSIS — F419 Anxiety disorder, unspecified: Secondary | ICD-10-CM

## 2019-09-27 ENCOUNTER — Other Ambulatory Visit: Payer: Self-pay | Admitting: Internal Medicine

## 2019-09-27 DIAGNOSIS — G629 Polyneuropathy, unspecified: Secondary | ICD-10-CM

## 2019-10-09 ENCOUNTER — Other Ambulatory Visit: Payer: Self-pay | Admitting: Family Medicine

## 2019-10-09 DIAGNOSIS — E781 Pure hyperglyceridemia: Secondary | ICD-10-CM

## 2019-12-31 ENCOUNTER — Other Ambulatory Visit: Payer: Self-pay | Admitting: Internal Medicine

## 2019-12-31 DIAGNOSIS — F32A Depression, unspecified: Secondary | ICD-10-CM

## 2019-12-31 DIAGNOSIS — F411 Generalized anxiety disorder: Secondary | ICD-10-CM

## 2019-12-31 NOTE — Telephone Encounter (Signed)
Routing to correct practice

## 2020-01-04 ENCOUNTER — Ambulatory Visit (INDEPENDENT_AMBULATORY_CARE_PROVIDER_SITE_OTHER): Payer: 59 | Admitting: Nurse Practitioner

## 2020-01-04 ENCOUNTER — Other Ambulatory Visit: Payer: Self-pay

## 2020-01-04 ENCOUNTER — Encounter: Payer: Self-pay | Admitting: Nurse Practitioner

## 2020-01-04 VITALS — BP 137/84 | HR 76 | Temp 97.3°F | Ht 68.0 in | Wt 218.0 lb

## 2020-01-04 DIAGNOSIS — I5022 Chronic systolic (congestive) heart failure: Secondary | ICD-10-CM

## 2020-01-04 DIAGNOSIS — Z0000 Encounter for general adult medical examination without abnormal findings: Secondary | ICD-10-CM | POA: Diagnosis not present

## 2020-01-04 DIAGNOSIS — E785 Hyperlipidemia, unspecified: Secondary | ICD-10-CM | POA: Diagnosis not present

## 2020-01-04 DIAGNOSIS — I739 Peripheral vascular disease, unspecified: Secondary | ICD-10-CM

## 2020-01-04 DIAGNOSIS — G4700 Insomnia, unspecified: Secondary | ICD-10-CM | POA: Diagnosis not present

## 2020-01-04 DIAGNOSIS — E781 Pure hyperglyceridemia: Secondary | ICD-10-CM | POA: Diagnosis not present

## 2020-01-04 DIAGNOSIS — F32A Depression, unspecified: Secondary | ICD-10-CM

## 2020-01-04 DIAGNOSIS — F411 Generalized anxiety disorder: Secondary | ICD-10-CM

## 2020-01-04 DIAGNOSIS — F329 Major depressive disorder, single episode, unspecified: Secondary | ICD-10-CM | POA: Diagnosis not present

## 2020-01-04 DIAGNOSIS — Z789 Other specified health status: Secondary | ICD-10-CM

## 2020-01-04 DIAGNOSIS — F1021 Alcohol dependence, in remission: Secondary | ICD-10-CM

## 2020-01-04 DIAGNOSIS — M899 Disorder of bone, unspecified: Secondary | ICD-10-CM

## 2020-01-04 DIAGNOSIS — M79671 Pain in right foot: Secondary | ICD-10-CM

## 2020-01-04 DIAGNOSIS — F17200 Nicotine dependence, unspecified, uncomplicated: Secondary | ICD-10-CM

## 2020-01-04 LAB — POCT GLYCOSYLATED HEMOGLOBIN (HGB A1C)
HbA1c POC (<> result, manual entry): 5.8 % (ref 4.0–5.6)
HbA1c, POC (controlled diabetic range): 5.8 % (ref 0.0–7.0)
HbA1c, POC (prediabetic range): 5.8 % (ref 5.7–6.4)
Hemoglobin A1C: 5.8 % — AB (ref 4.0–5.6)

## 2020-01-04 LAB — GLUCOSE, POCT (MANUAL RESULT ENTRY): POC Glucose: 91 mg/dl (ref 70–99)

## 2020-01-04 MED ORDER — BUSPIRONE HCL 15 MG PO TABS: 15.0000 mg | ORAL_TABLET | Freq: Two times a day (BID) | ORAL | 2 refills | Status: DC

## 2020-01-04 NOTE — Patient Instructions (Signed)
Healthy Eating Following a healthy eating pattern may help you to achieve and maintain a healthy body weight, reduce the risk of chronic disease, and live a long and productive life. It is important to follow a healthy eating pattern at an appropriate calorie level for your body. Your nutritional needs should be met primarily through food by choosing a variety of nutrient-rich foods. What are tips for following this plan? Reading food labels  Read labels and choose the following: ? Reduced or low sodium. ? Juices with 100% fruit juice. ? Foods with low saturated fats and high polyunsaturated and monounsaturated fats. ? Foods with whole grains, such as whole wheat, cracked wheat, brown rice, and wild rice. ? Whole grains that are fortified with folic acid. This is recommended for women who are pregnant or who want to become pregnant.  Read labels and avoid the following: ? Foods with a lot of added sugars. These include foods that contain brown sugar, corn sweetener, corn syrup, dextrose, fructose, glucose, high-fructose corn syrup, honey, invert sugar, lactose, malt syrup, maltose, molasses, raw sugar, sucrose, trehalose, or turbinado sugar.  Do not eat more than the following amounts of added sugar per day:  6 teaspoons (25 g) for women.  9 teaspoons (38 g) for men. ? Foods that contain processed or refined starches and grains. ? Refined grain products, such as white flour, degermed cornmeal, white bread, and white rice. Shopping  Choose nutrient-rich snacks, such as vegetables, whole fruits, and nuts. Avoid high-calorie and high-sugar snacks, such as potato chips, fruit snacks, and candy.  Use oil-based dressings and spreads on foods instead of solid fats such as butter, stick margarine, or cream cheese.  Limit pre-made sauces, mixes, and "instant" products such as flavored rice, instant noodles, and ready-made pasta.  Try more plant-protein sources, such as tofu, tempeh, black beans,  edamame, lentils, nuts, and seeds.  Explore eating plans such as the Mediterranean diet or vegetarian diet. Cooking  Use oil to saut or stir-fry foods instead of solid fats such as butter, stick margarine, or lard.  Try baking, boiling, grilling, or broiling instead of frying.  Remove the fatty part of meats before cooking.  Steam vegetables in water or broth. Meal planning   At meals, imagine dividing your plate into fourths: ? One-half of your plate is fruits and vegetables. ? One-fourth of your plate is whole grains. ? One-fourth of your plate is protein, especially lean meats, poultry, eggs, tofu, beans, or nuts.  Include low-fat dairy as part of your daily diet. Lifestyle  Choose healthy options in all settings, including home, work, school, restaurants, or stores.  Prepare your food safely: ? Wash your hands after handling raw meats. ? Keep food preparation surfaces clean by regularly washing with hot, soapy water. ? Keep raw meats separate from ready-to-eat foods, such as fruits and vegetables. ? Cook seafood, meat, poultry, and eggs to the recommended internal temperature. ? Store foods at safe temperatures. In general:  Keep cold foods at 59F (4.4C) or below.  Keep hot foods at 159F (60C) or above.  Keep your freezer at [facility] (-17.8C) or below.  Foods are no longer safe to eat when they have been between the temperatures of 40-159F (4.4-60C) for more than 2 hours. What foods should I eat? Fruits Aim to eat 2 cup-equivalents of fresh, canned (in natural juice), or frozen fruits each day. Examples of 1 cup-equivalent of fruit include 1 small apple, 8 large strawberries, 1 cup canned fruit,  cup  dried fruit, or 1 cup 100% juice. Vegetables Aim to eat 2-3 cup-equivalents of fresh and frozen vegetables each day, including different varieties and colors. Examples of 1 cup-equivalent of vegetables include 2 medium carrots, 2 cups raw, leafy greens, 1 cup chopped  vegetable (raw or cooked), or 1 medium baked potato. Grains Aim to eat 6 ounce-equivalents of whole grains each day. Examples of 1 ounce-equivalent of grains include 1 slice of bread, 1 cup ready-to-eat cereal, 3 cups popcorn, or  cup cooked rice, pasta, or cereal. Meats and other proteins Aim to eat 5-6 ounce-equivalents of protein each day. Examples of 1 ounce-equivalent of protein include 1 egg, 1/2 cup nuts or seeds, or 1 tablespoon (16 g) peanut butter. A cut of meat or fish that is the size of a deck of cards is about 3-4 ounce-equivalents.  Of the protein you eat each week, try to have at least 8 ounces come from seafood. This includes salmon, trout, herring, and anchovies. Dairy Aim to eat 3 cup-equivalents of fat-free or low-fat dairy each day. Examples of 1 cup-equivalent of dairy include 1 cup (240 mL) milk, 8 ounces (250 g) yogurt, 1 ounces (44 g) natural cheese, or 1 cup (240 mL) fortified soy milk. Fats and oils  Aim for about 5 teaspoons (21 g) per day. Choose monounsaturated fats, such as canola and olive oils, avocados, peanut butter, and most nuts, or polyunsaturated fats, such as sunflower, corn, and soybean oils, walnuts, pine nuts, sesame seeds, sunflower seeds, and flaxseed. Beverages  Aim for six 8-oz glasses of water per day. Limit coffee to three to five 8-oz cups per day.  Limit caffeinated beverages that have added calories, such as soda and energy drinks.  Limit alcohol intake to no more than 1 drink a day for nonpregnant women and 2 drinks a day for men. One drink equals 12 oz of beer (355 mL), 5 oz of wine (148 mL), or 1 oz of hard liquor (44 mL). Seasoning and other foods  Avoid adding excess amounts of salt to your foods. Try flavoring foods with herbs and spices instead of salt.  Avoid adding sugar to foods.  Try using oil-based dressings, sauces, and spreads instead of solid fats. This information is based on general [place] nutrition guidelines. For more  information, visit choosemyplate.gov. Exact amounts may vary based on your nutrition needs. Summary  A healthy eating plan may help you to maintain a healthy weight, reduce the risk of chronic diseases, and stay active throughout your life.  Plan your meals. Make sure you eat the right portions of a variety of nutrient-rich foods.  Try baking, boiling, grilling, or broiling instead of frying.  Choose healthy options in all settings, including home, work, school, restaurants, or stores. This information is not intended to replace advice given to you by your health care provider. Make sure you discuss any questions you have with your health care provider. Document Revised: 09/21/2017 Document Reviewed: 09/21/2017 Elsevier Patient Education  2020 Elsevier Inc.  Steps to Quit Smoking Smoking tobacco is the leading cause of preventable death. It can affect almost every organ in the body. Smoking puts you and people around you at risk for many serious, long-lasting (chronic) diseases. Quitting smoking can be hard, but it is one of the best things that you can do for your health. It is never too late to quit. How do I get ready to quit? When you decide to quit smoking, make a plan to help you succeed. Before   you quit:  Pick a date to quit. Set a date within the next 2 weeks to give you time to prepare.  Write down the reasons why you are quitting. Keep this list in places where you will see it often.  Tell your family, friends, and co-workers that you are quitting. Their support is important.  Talk with your doctor about the choices that may help you quit.  Find out if your health insurance will pay for these treatments.  Know the people, places, things, and activities that make you want to smoke (triggers). Avoid them. What first steps can I take to quit smoking?  Throw away all cigarettes at home, at work, and in your car.  Throw away the things that you use when you smoke, such as  ashtrays and lighters.  Clean your car. Make sure to empty the ashtray.  Clean your home, including curtains and carpets. What can I do to help me quit smoking? Talk with your doctor about taking medicines and seeing a counselor at the same time. You are more likely to succeed when you do both.  If you are pregnant or breastfeeding, talk with your doctor about counseling or other ways to quit smoking. Do not take medicine to help you quit smoking unless your doctor tells you to do so. To quit smoking: Quit right away  Quit smoking totally, instead of slowly cutting back on how much you smoke over a period of time.  Go to counseling. You are more likely to quit if you go to counseling sessions regularly. Take medicine You may take medicines to help you quit. Some medicines need a prescription, and some you can buy over-the-counter. Some medicines may contain a drug called nicotine to replace the nicotine in cigarettes. Medicines may:  Help you to stop having the desire to smoke (cravings).  Help to stop the problems that come when you stop smoking (withdrawal symptoms). Your doctor may ask you to use:  Nicotine patches, gum, or lozenges.  Nicotine inhalers or sprays.  Non-nicotine medicine that is taken by mouth. Find resources Find resources and other ways to help you quit smoking and remain smoke-free after you quit. These resources are most helpful when you use them often. They include:  Online chats with a counselor.  Phone quitlines.  Printed self-help materials.  Support groups or group counseling.  Text messaging programs.  Mobile phone apps. Use apps on your mobile phone or tablet that can help you stick to your quit plan. There are many free apps for mobile phones and tablets as well as websites. Examples include Quit Guide from the CDC and smokefree.gov  What things can I do to make it easier to quit?   Talk to your family and friends. Ask them to support and  encourage you.  Call a phone quitline (1-800-QUIT-NOW), reach out to support groups, or work with a counselor.  Ask people who smoke to not smoke around you.  Avoid places that make you want to smoke, such as: ? Bars. ? Parties. ? Smoke-break areas at work.  Spend time with people who do not smoke.  Lower the stress in your life. Stress can make you want to smoke. Try these things to help your stress: ? Getting regular exercise. ? Doing deep-breathing exercises. ? Doing yoga. ? Meditating. ? Doing a body scan. To do this, close your eyes, focus on one area of your body at a time from head to toe. Notice which parts of your   body are tense. Try to relax the muscles in those areas. How will I feel when I quit smoking? Day 1 to 3 weeks Within the first 24 hours, you may start to have some problems that come from quitting tobacco. These problems are very bad 2-3 days after you quit, but they do not often last for more than 2-3 weeks. You may get these symptoms:  Mood swings.  Feeling restless, nervous, angry, or annoyed.  Trouble concentrating.  Dizziness.  Strong desire for high-sugar foods and nicotine.  Weight gain.  Trouble pooping (constipation).  Feeling like you may vomit (nausea).  Coughing or a sore throat.  Changes in how the medicines that you take for other issues work in your body.  Depression.  Trouble sleeping (insomnia). Week 3 and afterward After the first 2-3 weeks of quitting, you may start to notice more positive results, such as:  Better sense of smell and taste.  Less coughing and sore throat.  Slower heart rate.  Lower blood pressure.  Clearer skin.  Better breathing.  Fewer sick days. Quitting smoking can be hard. Do not give up if you fail the first time. Some people need to try a few times before they succeed. Do your best to stick to your quit plan, and talk with your doctor if you have any questions or concerns. Summary  Smoking  tobacco is the leading cause of preventable death. Quitting smoking can be hard, but it is one of the best things that you can do for your health.  When you decide to quit smoking, make a plan to help you succeed.  Quit smoking right away, not slowly over a period of time.  When you start quitting, seek help from your doctor, family, or friends. This information is not intended to replace advice given to you by your health care provider. Make sure you discuss any questions you have with your health care provider. Document Revised: 03/04/2019 Document Reviewed: 08/28/2018 Elsevier Patient Education  2020 Elsevier Inc.  

## 2020-01-04 NOTE — Progress Notes (Signed)
[place], [place]  [address] Phone:  [phone]   Fax:  [phone]   Establish Office Visit  Subjective:    Patient ID: Alex Charles, male    DOB: [birth year], 54 y.o.   MRN: [id number]  Chief Complaint  Patient presents with  . Follow-up    Right foot soreness, feels like callus.     HPI Patient is in today for follow up. He has been lost to follow up. He was last seen on 01/19/2019. He  has a past medical history of Alcoholism in recovery [facility]), Allergy, Anxiety, Blockage of coronary artery of heart ([place]), CHF (congestive heart failure) ([place]), Depression, Hyperlipidemia, and Prediabetes.    Patient presents with right foot pain. Onset of the symptoms was several years ago. Precipitating event: none known. Current symptoms include: ability to bear weight, but with some pain. Aggravating factors: walking. Symptoms have gradually worsened. Patient has had prior foot problems. Evaluation to date: none. Treatment to date: OTC analgesics which are somewhat effective. He admits that now others can see him limping, He works fulltime and has to do a lot of walking and standing. He admits that he has CAD with decreased circulation. He is concern of overall health. His mother was recently diagnosed with diabetes,. He has prediabetes and has become more aware of what changes he needs to make.  He was previously on gabapentin  He is also following up on dyslipidemia. The patient does use medications that may worsen dyslipidemias (corticosteroids, progestins, anabolic steroids, diuretics, beta-blockers, amiodarone, cyclosporine, olanzapine). The patient exercises rarely. The patient is known to have coexisting coronary artery disease.   Cardiac Risk Factors Age > 45-male, > 55-male:  YES  +1  Smoking:   YES  +1  Sig. family hx of CHD*:  NO  Hypertension:   NO  Diabetes:   NO  HDL < 35:   NO  HDL > 59:   NO  Total: 2   *- Sig. family h/o CHD  per NCEP = MI or sudden death at <55yo in  father or other 42st-degree male relative, or <65yo in mother or  other 1st-degree male relative   Dyslipidemia as detailed above with 2 CHD risk factors using NCEP scheme above.    Past Medical History:  Diagnosis Date  . Alcoholism in recovery [facility])    happened 4 years ago/ went thru detox in the hospital  . Allergy    seasonal  . Anxiety   . Blockage of coronary artery of heart ([place])    1 stent put in/ in 2015  . CHF (congestive heart failure) ([place])   . Depression   . Hyperlipidemia   . Prediabetes     Past Surgical History:  Procedure Laterality Date  . CORONARY ANGIOPLASTY WITH STENT PLACEMENT  2015   one stent  . OTHER SURGICAL HISTORY    . tooth extracted      Family History  Problem Relation Age of Onset  . Bladder Cancer Mother   . Crohn's disease Mother   . Hypertension Father   . Irritable bowel syndrome Sister     Social History   Socioeconomic History  . Marital status: Single    Spouse name: Not on file  . Number of children: Not on file  . Years of education: Not on file  . Highest education level: Not on file  Occupational History  . Not on file  Tobacco Use  . Smoking  status: Current Every Day Smoker    Packs/day: 0.50    Types: Cigarettes  . Smokeless tobacco: Never Used  Vaping Use  . Vaping Use: Never used  Substance and Sexual Activity  . Alcohol use: No  . Drug use: No  . Sexual activity: Not on file  Other Topics Concern  . Not on file  Social History Narrative  . Not on file   Social Determinants of Health   Financial Resource Strain:   . Difficulty of Paying Living Expenses:   Food Insecurity:   . Worried About Charity fundraiser in the Last Year:   . Arboriculturist in the Last Year:   Transportation Needs:   . Film/video editor (Medical):   Marland Kitchen Lack of Transportation (Non-Medical):   Physical Activity:   . Days of Exercise per Week:   . Minutes of Exercise per Session:    Stress:   . Feeling of Stress :   Social Connections:   . Frequency of Communication with Friends and Family:   . Frequency of Social Gatherings with Friends and Family:   . Attends Religious Services:   . Active Member of Clubs or Organizations:   . Attends Archivist Meetings:   Marland Kitchen Marital Status:   Intimate Partner Violence:   . Fear of Current or Ex-Partner:   . Emotionally Abused:   Marland Kitchen Physically Abused:   . Sexually Abused:     Outpatient Medications Prior to Visit  Medication Sig Dispense Refill  . acetaminophen (TYLENOL) 500 MG tablet Take 500 mg by mouth every 6 (six) hours as needed. Reported on 12/10/2015    . aspirin EC 81 MG tablet Take 1 tablet (81 mg total) by mouth daily. 30 tablet 11  . hydrOXYzine (ATARAX/VISTARIL) 10 MG tablet Take 1 tablet (10 mg total) by mouth 3 (three) times daily as needed. Need appointment for refills! 30 tablet 0  . ibuprofen (ADVIL,MOTRIN) 200 MG tablet Take 200 mg by mouth every 6 (six) hours as needed.    . traZODone (DESYREL) 50 MG tablet TAKE 1 TABLET (50 MG TOTAL) BY MOUTH AT BEDTIME AS NEEDED FOR SLEEP. 90 tablet 2  . busPIRone (BUSPAR) 15 MG tablet TAKE 1 TABLET BY MOUTH TWICE DAILY 180 tablet 2  . fenofibrate (TRICOR) 145 MG tablet TAKE 1 TABLET BY MOUTH EVERY DAY 90 tablet 2  . gabapentin (NEURONTIN) 300 MG capsule Take 1 capsule (300 mg total) by mouth 3 (three) times daily. Needs appointment for refills! (Patient not taking: Reported on 01/04/2020) 90 capsule 0   Facility-Administered Medications Prior to Visit  Medication Dose Route Frequency Provider Last Rate Last Admin  . 0.9 %  sodium chloride infusion  500 mL Intravenous Once [place], MD        No Known Allergies  Review of Systems  Respiratory: Positive for shortness of breath.   Gastrointestinal: Positive for constipation and nausea.       Getting worse       Objective:    Physical Exam Constitutional:      Appearance: He is obese.  HENT:       Nose: Nose normal.     Mouth/Throat:     Mouth: Mucous membranes are moist.  Cardiovascular:     Rate and Rhythm: Normal rate and regular rhythm.     Pulses: Normal pulses.     Heart sounds: Normal heart sounds.  Pulmonary:     Effort: Pulmonary effort is normal.  Breath sounds: Normal breath sounds.  Abdominal:     General: Bowel sounds are normal.     Palpations: Abdomen is soft.     Comments: Increased abdominal girth  Musculoskeletal:     Cervical back: Normal range of motion.       Feet:  Feet:     Right foot:     Protective Sensation: 10 sites tested. 10 sites sensed.     Skin integrity: Erythema, callus and dry skin present.     Left foot:     Protective Sensation: 10 sites tested. 10 sites sensed.     Skin integrity: Dry skin present.  Skin:    General: Skin is warm and dry.  Neurological:     General: No focal deficit present.     Mental Status: He is alert and oriented to person, place, and time.  Psychiatric:        Mood and Affect: Mood normal.        Behavior: Behavior normal.        Thought Content: Thought content normal.        Judgment: Judgment normal.     BP 137/84   Pulse 76   Temp (!) 97.3 F (36.3 C)   Ht 5\' 8"  (1.727 m)   Wt 218 lb 0.2 oz (98.9 kg)   SpO2 98%   BMI 33.15 kg/m  Wt Readings from Last 3 Encounters:  01/04/20 218 lb 0.2 oz (98.9 kg)  01/19/19 213 lb (96.6 kg)  08/18/18 221 lb (100.2 kg)    There are no preventive care reminders to display for this patient.  There are no preventive care reminders to display for this patient.   Lab Results  Component Value Date   TSH 1.880 01/04/2020   Lab Results  Component Value Date   WBC 7.8 01/04/2020   HGB 16.6 01/04/2020   HCT 47.6 01/04/2020   MCV 92 01/04/2020   PLT 227 01/04/2020   Lab Results  Component Value Date   NA 138 01/04/2020   K 4.5 01/04/2020   CO2 22 09/22/2018   GLUCOSE 76 01/04/2020   BUN 12 01/04/2020   CREATININE 1.14 01/04/2020   BILITOT  0.3 01/04/2020   ALKPHOS 60 01/04/2020   AST 44 (H) 01/04/2020   ALT 35 09/22/2018   PROT 7.3 01/04/2020   ALBUMIN 4.8 01/04/2020   CALCIUM 10.1 01/04/2020   Lab Results  Component Value Date   CHOL 296 (H) 01/04/2020   Lab Results  Component Value Date   HDL 34 (L) 01/04/2020   Lab Results  Component Value Date   LDLCALC 208 (H) 01/04/2020   Lab Results  Component Value Date   TRIG 267 (H) 01/04/2020   Lab Results  Component Value Date   CHOLHDL 8.7 (H) 01/04/2020   Lab Results  Component Value Date   HGBA1C 5.8 (A) 01/04/2020   HGBA1C 5.8 01/04/2020   HGBA1C 5.8 01/04/2020   HGBA1C 5.8 01/04/2020       Assessment & Plan:   Problem List Items Addressed This Visit      Other   Depression   Relevant Medications   busPIRone (BUSPAR) 15 MG tablet Stable overall we will continue with current regimen including trazodone   Generalized anxiety disorder   Relevant Medications   busPIRone (BUSPAR) 15 MG tablet   Tobacco dependence Encourage smoking cessation Education on peripheral Vascular disease and tobacco use    Other Visit Diagnoses    Hyperlipidemia, unspecified hyperlipidemia  type    -  Primary   Relevant Orders   Lipid panel (Completed)   Insomnia, unspecified type     Continue with the trazodone as directed   Hypertriglyceridemia       Relevant Orders   Comp. Metabolic Panel (12) (Completed)   Right foot pain       Relevant Orders   Ambulatory referral to Podiatry   Sedimentation rate (Completed)   DG Foot Complete Right   Healthcare maintenance       Relevant Orders   CBC with Differential/Platelet (Completed)   TSH (Completed)   Glucose (CBG) (Completed)   HgB A1c (Completed)   Problems influencing health status       Relevant Orders   Vitamin B12 (Completed)   Magnesium (Completed)   Disorder of skeletal system       Relevant Orders   VITAMIN D 25 Hydroxy (Vit-D Deficiency, Fractures) (Completed)       Meds ordered this encounter   Medications  . busPIRone (BUSPAR) 15 MG tablet    Sig: Take 1 tablet (15 mg total) by mouth 2 (two) times daily.    Dispense:  180 tablet    Refill:  2    Order Specific Question:   Supervising Provider    Answer:   Tresa Garter [id number]    Plan:   Target levels for LDL are: < 100 mg/dl (CHD or "CHD risk equivalent" is present)  Explained to the patient the respective contributions of genetics, diet, and exercise to lipid levels and the use of medication in severe cases which do not respond to lifestyle alteration. The patient's interest and motivation in making lifestyle changes seems fair.    The following changes are planned for the next 3 months, at which time the patient will return for repeat fasting lipids:  1. Dietary changes: Reduce saturated fat, "trans" monounsaturated fatty acids, and cholesterol 2. Exercise changes:  Advised to engage in walking  frequency goal is 3-4 times a week 3. Other treatment: Smoking cessation and weight reduction 4. Lipid-lowering medications: fenofibrate  (Recommended by NCEP after 3-6 mos of dietary therapy & lifestyle modification,  except if CHD is present or LDL well above 190.) 5. Hormone replacement therapy (patient is a postmenopausal  woman): no 6. Screening for secondary causes of dyslipidemias: TSH Blood glucose Alkaline phosphatase to r/o hepatobiliary obstruction Serum creatinine to r/o chronic renal failure 7. Lipid screening for relatives: parents completed 11. Follow up: 3 months.  Note: The majority of the visit was spent in counseling on the pathophysiology and treatment of dyslipidemias. The total face-to-face time was in excess of 20 minutes.   Vevelyn Francois, NP

## 2020-01-05 LAB — COMP. METABOLIC PANEL (12)
AST: 44 IU/L — ABNORMAL HIGH (ref 0–40)
Albumin/Globulin Ratio: 1.9 (ref 1.2–2.2)
Albumin: 4.8 g/dL (ref 3.8–4.9)
Alkaline Phosphatase: 60 IU/L (ref 48–121)
BUN/Creatinine Ratio: 11 (ref 9–20)
BUN: 12 mg/dL (ref 6–24)
Bilirubin Total: 0.3 mg/dL (ref 0.0–1.2)
Calcium: 10.1 mg/dL (ref 8.7–10.2)
Chloride: 102 mmol/L (ref 96–106)
Creatinine, Ser: 1.14 mg/dL (ref 0.76–1.27)
GFR calc Af Amer: 83 mL/min/{1.73_m2} (ref >59)
GFR calc non Af Amer: 72 mL/min/{1.73_m2} (ref >59)
Globulin, Total: 2.5 g/dL (ref 1.5–4.5)
Glucose: 76 mg/dL (ref 65–99)
Potassium: 4.5 mmol/L (ref 3.5–5.2)
Sodium: 138 mmol/L (ref 134–144)
Total Protein: 7.3 g/dL (ref 6.0–8.5)

## 2020-01-05 LAB — LIPID PANEL
Chol/HDL Ratio: 8.7 ratio — ABNORMAL HIGH (ref 0.0–5.0)
Cholesterol, Total: 296 mg/dL — ABNORMAL HIGH (ref 100–199)
HDL: 34 mg/dL — ABNORMAL LOW (ref >39)
LDL Chol Calc (NIH): 208 mg/dL — ABNORMAL HIGH (ref 0–99)
Triglycerides: 267 mg/dL — ABNORMAL HIGH (ref 0–149)
VLDL Cholesterol Cal: 54 mg/dL — ABNORMAL HIGH (ref 5–40)

## 2020-01-05 LAB — SEDIMENTATION RATE: Sed Rate: 8 mm/hr (ref 0–30)

## 2020-01-05 LAB — CBC WITH DIFFERENTIAL/PLATELET
Basophils Absolute: 0.1 10*3/uL (ref 0.0–0.2)
Basos: 1 %
EOS (ABSOLUTE): 0.2 10*3/uL (ref 0.0–0.4)
Eos: 3 %
Hematocrit: 47.6 % (ref 37.5–51.0)
Hemoglobin: 16.6 g/dL (ref 13.0–17.7)
Immature Grans (Abs): 0 10*3/uL (ref 0.0–0.1)
Immature Granulocytes: 0 %
Lymphocytes Absolute: 3.3 10*3/uL — ABNORMAL HIGH (ref 0.7–3.1)
Lymphs: 43 %
MCH: 32.1 pg (ref 26.6–33.0)
MCHC: 34.9 g/dL (ref 31.5–35.7)
MCV: 92 fL (ref 79–97)
Monocytes Absolute: 0.6 10*3/uL (ref 0.1–0.9)
Monocytes: 8 %
Neutrophils Absolute: 3.6 10*3/uL (ref 1.4–7.0)
Neutrophils: 45 %
Platelets: 227 10*3/uL (ref 150–450)
RBC: 5.17 x10E6/uL (ref 4.14–5.80)
RDW: 13.8 % (ref 11.6–15.4)
WBC: 7.8 10*3/uL (ref 3.4–10.8)

## 2020-01-05 LAB — VITAMIN B12: Vitamin B-12: >2000 pg/mL — ABNORMAL HIGH (ref 232–1245)

## 2020-01-05 LAB — TSH: TSH: 1.88 u[IU]/mL (ref 0.450–4.500)

## 2020-01-05 LAB — MAGNESIUM: Magnesium: 2.1 mg/dL (ref 1.6–2.3)

## 2020-01-05 LAB — VITAMIN D 25 HYDROXY (VIT D DEFICIENCY, FRACTURES): Vit D, 25-Hydroxy: 24.2 ng/mL — ABNORMAL LOW (ref 30.0–100.0)

## 2020-01-06 ENCOUNTER — Other Ambulatory Visit: Payer: Self-pay | Admitting: Nurse Practitioner

## 2020-01-06 MED ORDER — FENOFIBRATE 160 MG PO TABS: 160.0000 mg | ORAL_TABLET | Freq: Every day | ORAL | 2 refills | Status: DC

## 2020-01-07 DIAGNOSIS — I739 Peripheral vascular disease, unspecified: Secondary | ICD-10-CM | POA: Insufficient documentation

## 2020-01-18 ENCOUNTER — Ambulatory Visit: Payer: 59 | Admitting: Podiatry

## 2020-01-20 ENCOUNTER — Ambulatory Visit: Payer: 59 | Admitting: Podiatry

## 2020-01-20 ENCOUNTER — Other Ambulatory Visit: Payer: Self-pay

## 2020-01-20 DIAGNOSIS — M79672 Pain in left foot: Secondary | ICD-10-CM | POA: Diagnosis not present

## 2020-01-20 DIAGNOSIS — R234 Changes in skin texture: Secondary | ICD-10-CM | POA: Diagnosis not present

## 2020-01-20 DIAGNOSIS — L853 Xerosis cutis: Secondary | ICD-10-CM

## 2020-01-20 MED ORDER — AMMONIUM LACTATE 12 % EX LOTN: TOPICAL_LOTION | CUTANEOUS | 0 refills | Status: DC

## 2020-01-20 NOTE — Progress Notes (Signed)
  Subjective:  Patient ID: Alex Charles, male    DOB: [birth year],  MRN: [id number]  Chief Complaint  Patient presents with  . Foot Problem    Right plantar forefoot painful skin lesions, including sub 2nd digit plantar forefoot "Feels like a hole". 1 year duration.   . Foot Pain    Pt states lateral foot pain with dorsal flexion,1 yr duration.    54 y.o. male presents with the above complaint. History confirmed with patient.   Objective:  Physical Exam: warm, good capillary refill, no trophic changes or ulcerative lesions, diminished DP and PT pulses and normal sensory exam. Xerosis with hyperkeratosis, skin fissure right 4th proximal met area Left Foot: normal exam, no swelling, tenderness, instability; ligaments intact, full range of motion of all ankle/foot joints  Right Foot: normal exam, no swelling, tenderness, instability; ligaments intact, full range of motion of all ankle/foot joints   Assessment:   1. Skin fissure   2. Xerosis cutis      Plan:  Patient was evaluated and treated and all questions answered.  Right foot xerosis, punctate keratosis, skin fissure -Recc AmLactin cream for xerosis, avoid fissured area -Punctate keratosis debrided -Discussed soaking, use of emollient cream. -Salinocaine applied today  Return in about 6 weeks (around 03/02/2020) for Xerosis f/u .

## 2020-03-01 ENCOUNTER — Other Ambulatory Visit: Payer: Self-pay | Admitting: Family Medicine

## 2020-03-01 DIAGNOSIS — G4700 Insomnia, unspecified: Secondary | ICD-10-CM

## 2020-03-02 ENCOUNTER — Ambulatory Visit: Payer: 59 | Admitting: Podiatry

## 2020-03-09 ENCOUNTER — Ambulatory Visit: Payer: 59 | Admitting: Podiatry

## 2020-03-09 NOTE — Telephone Encounter (Signed)
Please review  Thank you

## 2020-03-16 ENCOUNTER — Ambulatory Visit: Payer: 59 | Admitting: Podiatry

## 2020-04-04 ENCOUNTER — Ambulatory Visit: Payer: Self-pay | Admitting: Nurse Practitioner

## 2020-04-18 ENCOUNTER — Other Ambulatory Visit: Payer: Self-pay

## 2020-04-18 ENCOUNTER — Ambulatory Visit (INDEPENDENT_AMBULATORY_CARE_PROVIDER_SITE_OTHER): Payer: 59 | Admitting: Nurse Practitioner

## 2020-04-18 DIAGNOSIS — F411 Generalized anxiety disorder: Secondary | ICD-10-CM

## 2020-04-18 DIAGNOSIS — E785 Hyperlipidemia, unspecified: Secondary | ICD-10-CM

## 2020-04-18 DIAGNOSIS — R7303 Prediabetes: Secondary | ICD-10-CM

## 2020-04-18 DIAGNOSIS — G629 Polyneuropathy, unspecified: Secondary | ICD-10-CM

## 2020-04-18 DIAGNOSIS — F419 Anxiety disorder, unspecified: Secondary | ICD-10-CM

## 2020-04-18 DIAGNOSIS — F32A Depression, unspecified: Secondary | ICD-10-CM

## 2020-04-18 LAB — POCT GLYCOSYLATED HEMOGLOBIN (HGB A1C)
HbA1c POC (<> result, manual entry): 6 %
HbA1c, POC (controlled diabetic range): 6 % (ref 0.0–7.0)
HbA1c, POC (prediabetic range): 6 % (ref 5.7–6.4)
Hemoglobin A1C: 6 % — AB (ref 4.0–5.6)

## 2020-04-18 LAB — GLUCOSE, POCT (MANUAL RESULT ENTRY): POC Glucose: 128 mg/dl — AB (ref 70–99)

## 2020-04-18 MED ORDER — FENOFIBRATE 160 MG PO TABS: 160.0000 mg | ORAL_TABLET | Freq: Every day | ORAL | 2 refills | Status: DC

## 2020-04-18 MED ORDER — GABAPENTIN 300 MG PO CAPS: 300.0000 mg | ORAL_CAPSULE | Freq: Three times a day (TID) | ORAL | 0 refills | Status: DC

## 2020-04-18 NOTE — Patient Instructions (Signed)

## 2020-04-18 NOTE — Progress Notes (Signed)
[address] [place], [place]  [address] Phone:  [phone]   Fax:  [phone]   Established Patient Office Visit  Subjective:  Patient ID: Alex Charles, male    DOB: 09-02-1965  Age: 54 y.o. MRN: [id number]  CC:  Chief Complaint  Patient presents with  . Follow-up    Pt states he is having some stiffness in his L hip X 96yrs. Pt states he also have a mole on his L arm X88months.    HPI Alex Charles presents for follow up. He  has a past medical history of Alcoholism in recovery [facility]), Allergy, Anxiety, Blockage of coronary artery of heart ([place]), CHF (congestive heart failure) ([place]), Depression, Hyperlipidemia, and Prediabetes.   Dyslipidemia Patient presents for evaluation of lipids. Compliance with treatment thus far has been fair. A repeat fasting lipid profile was done. The patient does use medications that may worsen dyslipidemias (corticosteroids, progestins, anabolic steroids, diuretics, beta-blockers, amiodarone, cyclosporine, olanzapine). The patient exercises infrequently. He continues to smoke however less. He has to smoke outside. He has an e-cigarette occasionally. Prediabetes; he has not made changes .  The patient is not known to have coexisting coronary artery disease.   Cardiac Risk Factors Age > 45-male, > 55-male:  YES  +1  Smoking:   YES  +1  Sig. family hx of CHD*:  YES  +1  Hypertension:   NO  Diabetes:   NO  HDL < 35:   YES  +1  HDL > 59:   NO  Total: 4    Hip Pain  Patient complains of right hip pain. Onset of the symptoms was several months ago. Inciting event: none. The patient reports the hip pain is aggravated by walking. Associated symptoms: none, knee giving out, knee locking. Aggravating symptoms include: any weight bearing. Patient has had prior hip problems. Previous visits for this problem: yes, last seen several months ago by me. Evaluation to date: plain films, which were normal in 2016. Treatment to  date: OTC analgesics, which have been not very effective.  He is living on his on his own. He has lived with his parents for 5 yrs.   Past Medical History:  Diagnosis Date  . Alcoholism in recovery [facility])    happened 4 years ago/ went thru detox in the hospital  . Allergy    seasonal  . Anxiety   . Blockage of coronary artery of heart ([place])    1 stent put in/ in 2015  . CHF (congestive heart failure) ([place])   . Depression   . Hyperlipidemia   . Prediabetes     Past Surgical History:  Procedure Laterality Date  . CORONARY ANGIOPLASTY WITH STENT PLACEMENT  2015   one stent  . OTHER SURGICAL HISTORY    . tooth extracted      Family History  Problem Relation Age of Onset  . Bladder Cancer Mother   . Crohn's disease Mother   . Hypertension Father   . Irritable bowel syndrome Sister     Social History   Socioeconomic History  . Marital status: Single    Spouse name: Not on file  . Number of children: Not on file  . Years of education: Not on file  . Highest education level: Not on file  Occupational History  . Not on file  Tobacco Use  . Smoking status: Current Every Day Smoker    Packs/day: 0.50    Types: Cigarettes  . Smokeless  tobacco: Never Used  Vaping Use  . Vaping Use: Never used  Substance and Sexual Activity  . Alcohol use: No  . Drug use: No  . Sexual activity: Not on file  Other Topics Concern  . Not on file  Social History Narrative  . Not on file   Social Determinants of Health   Financial Resource Strain:   . Difficulty of Paying Living Expenses: Not on file  Food Insecurity:   . Worried About Charity fundraiser in the Last Year: Not on file  . Ran Out of Food in the Last Year: Not on file  Transportation Needs:   . Lack of Transportation (Medical): Not on file  . Lack of Transportation (Non-Medical): Not on file  Physical Activity:   . Days of Exercise per Week: Not on file  . Minutes of Exercise per Session: Not on file  Stress:   .  Feeling of Stress : Not on file  Social Connections:   . Frequency of Communication with Friends and Family: Not on file  . Frequency of Social Gatherings with Friends and Family: Not on file  . Attends Religious Services: Not on file  . Active Member of Clubs or Organizations: Not on file  . Attends Archivist Meetings: Not on file  . Marital Status: Not on file  Intimate Partner Violence:   . Fear of Current or Ex-Partner: Not on file  . Emotionally Abused: Not on file  . Physically Abused: Not on file  . Sexually Abused: Not on file    Outpatient Medications Prior to Visit  Medication Sig Dispense Refill  . acetaminophen (TYLENOL) 500 MG tablet Take 500 mg by mouth every 6 (six) hours as needed. Reported on 12/10/2015    . aspirin EC 81 MG tablet Take 1 tablet (81 mg total) by mouth daily. 30 tablet 11  . busPIRone (BUSPAR) 15 MG tablet TAKE 1 TABLET BY MOUTH TWICE A DAY 180 tablet 2  . hydrOXYzine (ATARAX/VISTARIL) 10 MG tablet Take 1 tablet (10 mg total) by mouth 3 (three) times daily as needed. Need appointment for refills! 30 tablet 0  . ibuprofen (ADVIL,MOTRIN) 200 MG tablet Take 200 mg by mouth every 6 (six) hours as needed.    . traZODone (DESYREL) 50 MG tablet TAKE 1 TABLET (50 MG TOTAL) BY MOUTH AT BEDTIME AS NEEDED FOR SLEEP. 90 tablet 3  . gabapentin (NEURONTIN) 300 MG capsule Take 1 capsule (300 mg total) by mouth 3 (three) times daily. Needs appointment for refills! 90 capsule 0  . ammonium lactate (AMLACTIN) 12 % lotion Apply topically daily to bottom of feet and in between toes. Avoid open areas. (Patient not taking: Reported on 04/18/2020) 400 g 0  . fenofibrate 160 MG tablet Take 1 tablet (160 mg total) by mouth daily. 30 tablet 2   Facility-Administered Medications Prior to Visit  Medication Dose Route Frequency Provider Last Rate Last Admin  . 0.9 %  sodium chloride infusion  500 mL Intravenous Once Thornton Park, MD        No Known  Allergies  ROS Review of Systems    Objective:    Physical Exam Constitutional:      Appearance: He is obese.  HENT:     Head: Normocephalic and atraumatic.     Nose: Nose normal.     Mouth/Throat:     Mouth: Mucous membranes are moist.  Cardiovascular:     Rate and Rhythm: Normal rate and regular rhythm.  Pulses: Normal pulses.     Heart sounds: Normal heart sounds.  Pulmonary:     Effort: Pulmonary effort is normal.     Breath sounds: Normal breath sounds.  Abdominal:     Palpations: Abdomen is soft.  Musculoskeletal:        General: Normal range of motion.  Skin:    General: Skin is warm and dry.     Capillary Refill: Capillary refill takes less than 2 seconds.  Neurological:     General: No focal deficit present.     Mental Status: He is alert and oriented to person, place, and time.  Psychiatric:        Mood and Affect: Mood normal.        Behavior: Behavior normal.        Thought Content: Thought content normal.        Judgment: Judgment normal.     There were no vitals taken for this visit. Wt Readings from Last 3 Encounters:  01/04/20 218 lb 0.2 oz (98.9 kg)  01/19/19 213 lb (96.6 kg)  08/18/18 221 lb (100.2 kg)     There are no preventive care reminders to display for this patient.  There are no preventive care reminders to display for this patient.  Lab Results  Component Value Date   TSH 1.880 01/04/2020   Lab Results  Component Value Date   WBC 7.8 01/04/2020   HGB 16.6 01/04/2020   HCT 47.6 01/04/2020   MCV 92 01/04/2020   PLT 227 01/04/2020   Lab Results  Component Value Date   NA 138 01/04/2020   K 4.5 01/04/2020   CO2 22 09/22/2018   GLUCOSE 76 01/04/2020   BUN 12 01/04/2020   CREATININE 1.14 01/04/2020   BILITOT 0.3 01/04/2020   ALKPHOS 60 01/04/2020   AST 44 (H) 01/04/2020   ALT 35 09/22/2018   PROT 7.3 01/04/2020   ALBUMIN 4.8 01/04/2020   CALCIUM 10.1 01/04/2020   Lab Results  Component Value Date   CHOL 296 (H)  01/04/2020   Lab Results  Component Value Date   HDL 34 (L) 01/04/2020   Lab Results  Component Value Date   LDLCALC 208 (H) 01/04/2020   Lab Results  Component Value Date   TRIG 267 (H) 01/04/2020   Lab Results  Component Value Date   CHOLHDL 8.7 (H) 01/04/2020   Lab Results  Component Value Date   HGBA1C 6.0 (A) 04/18/2020   HGBA1C 6.0 04/18/2020   HGBA1C 6.0 04/18/2020   HGBA1C 6.0 04/18/2020      Assessment  Primary Diagnosis & Pertinent Problem List: The primary encounter diagnosis was Hyperlipidemia, unspecified hyperlipidemia type. Diagnoses of Prediabetes, Anxiety, Neuropathy, Depression, unspecified depression type, and Generalized anxiety disorder were also pertinent to this visit.  Visit Diagnosis: 1. Hyperlipidemia, unspecified hyperlipidemia type  Dietary changes: Reduce saturated fat, "trans" monounsaturated fatty acids, and cholesterol Exercise changes:  Daily Other treatment Smoking cessation 4. Lipid-lowering medicationss   2. Prediabetes  Consider home glucose monitoring Weight loss at least 5% of current body weight is can be achieved with lifestyle modification dietary changes and regular daily exercise Encourage blood pressure control goal <120/80 and maintaining total cholesterol <200 Follow-up every 3 to 6 months for reevaluation   3. Anxiety   4. Neuropathy   5. Depression, unspecified depression type   6. Generalized anxiety disorder      Meds ordered this encounter  Medications  . fenofibrate 160 MG tablet  Sig: Take 1 tablet (160 mg total) by mouth daily.    Dispense:  30 tablet    Refill:  2    Order Specific Question:   Supervising Provider    Answer:   Tresa Garter [id number]  . gabapentin (NEURONTIN) 300 MG capsule    Sig: Take 1 capsule (300 mg total) by mouth 3 (three) times daily. Needs appointment for refills!    Dispense:  90 capsule    Refill:  0    Order Specific Question:   Supervising Provider    Answer:    Tresa Garter [[id number]]    Follow-up: Return in about 4 weeks (around 05/16/2020) for lab apt and 3 mon follow up thanks.    Vevelyn Francois, NP

## 2020-04-20 ENCOUNTER — Encounter: Payer: Self-pay | Admitting: Nurse Practitioner

## 2020-04-25 ENCOUNTER — Encounter: Payer: Self-pay | Admitting: Skilled Nursing Facility1

## 2020-04-25 ENCOUNTER — Other Ambulatory Visit: Payer: Self-pay

## 2020-04-25 ENCOUNTER — Encounter: Payer: 59 | Attending: Nurse Practitioner | Admitting: Skilled Nursing Facility1

## 2020-04-25 DIAGNOSIS — R7303 Prediabetes: Secondary | ICD-10-CM | POA: Insufficient documentation

## 2020-04-25 NOTE — Progress Notes (Signed)
  Assessment:  Primary concerns today: prediabetes.   A1C 6.0 Pt states he has a hx of recovery in alcoholism and congestive heart failure and does have neuropathy.  Pt states he does not eat a lot and recognizes he does not eat well. Pt states his sleep has been really spuratic leading to him not feeling rested. Pt states he takes naps throughout the day feeling fatigued. Pt states he has recently moved into his own place so he is worried about financial concerns but excited too.   MEDICATIONS: see list   DIETARY INTAKE:  Usual eating pattern includes 0 meals and 3-4 snacks per day.  Everyday foods include none stated.  Avoided foods include none stated.    24-hr recall:  B ( AM): coffee Snk ( AM): cookie L ( PM): vendindg machine snacks Snk ( PM):  D ( PM): ice cream Snk ( PM):  Beverages: 60-80oz coffee + milk and splenda,   Usual physical activity: ADL's  Estimated energy needs: 1800 calories    Intervention:  Nutrition counseling. Dietitian educated pt on a healthy diet within the context of weight loss and prediabetes. Why you need complex carbohydrates: Whole grains and other complex carbohydrates are required to have a healthy diet. Whole grains provide fiber which can help with blood glucose levels and help keep you satiated. Fruits and starchy vegetables provide essential vitamins and minerals required for immune function, eyesight support, brain support, bone density, wound healing and many other functions within the body. According to the current evidenced based 2020-2025 Dietary Guidelines for Americans, complex carbohydrates are part of a healthy eating pattern which is associated with a decreased risk for type 2 diabetes, cancers, and cardiovascular disease.  Importance of vegetables To have an overall healthy diet, adult men and women are recommended to consume anywhere from 2-3 cups of vegetables daily. Vegetables provide a wide range of vitamins and minerals such as  vitamin A, vitamin C, potassium, and folic acid. According to the [employer], including fruit and vegetables daily may reduce the risk of cardiovascular disease, certain cancers, and other non-communicable diseases.  Goals: Aim for some of your coffee to be decaff Start your day with a cup of water Aim to start eating within 1-1.5 hours after waking:  Idea: peanut butter toast OR oatmeal  Lunch: 5 hours later  Idea: salad OR sandwich Dinner: 5 hours later  Idea: pasta + salad + meat OR sausage, cheese, crackers, fruit, chocolate, raw cauliflower or jarred aspargus Try to avoid vending machines  Start riding your bike again  Teaching Method Utilized:  Visual Auditory Hands on  Handouts given during visit include:  Detailed MyPlate  Barriers to learning/adherence to lifestyle change: none identified  Demonstrated degree of understanding via:  Teach Back   Monitoring/Evaluation:  Dietary intake, exercise, and body weight prn.

## 2020-05-02 ENCOUNTER — Telehealth: Payer: Self-pay | Admitting: Nurse Practitioner

## 2020-05-02 NOTE — Telephone Encounter (Signed)
Pt was called concerning AWV w/ P[facility]. lvm to call back °

## 2020-05-16 ENCOUNTER — Other Ambulatory Visit: Payer: Self-pay

## 2020-05-16 ENCOUNTER — Other Ambulatory Visit: Payer: 59

## 2020-05-16 DIAGNOSIS — R7303 Prediabetes: Secondary | ICD-10-CM

## 2020-05-16 DIAGNOSIS — E785 Hyperlipidemia, unspecified: Secondary | ICD-10-CM

## 2020-05-17 LAB — LIPID PANEL
Chol/HDL Ratio: 8.2 ratio — ABNORMAL HIGH (ref 0.0–5.0)
Cholesterol, Total: 287 mg/dL — ABNORMAL HIGH (ref 100–199)
HDL: 35 mg/dL — ABNORMAL LOW (ref >39)
LDL Chol Calc (NIH): 208 mg/dL — ABNORMAL HIGH (ref 0–99)
Triglycerides: 223 mg/dL — ABNORMAL HIGH (ref 0–149)
VLDL Cholesterol Cal: 44 mg/dL — ABNORMAL HIGH (ref 5–40)

## 2020-05-17 LAB — COMP. METABOLIC PANEL (12)
AST: 38 IU/L (ref 0–40)
Albumin/Globulin Ratio: 2 (ref 1.2–2.2)
Albumin: 4.8 g/dL (ref 3.8–4.9)
Alkaline Phosphatase: 56 IU/L (ref 44–121)
BUN/Creatinine Ratio: 16 (ref 9–20)
BUN: 16 mg/dL (ref 6–24)
Bilirubin Total: 0.2 mg/dL (ref 0.0–1.2)
Calcium: 10.4 mg/dL — ABNORMAL HIGH (ref 8.7–10.2)
Chloride: 102 mmol/L (ref 96–106)
Creatinine, Ser: 1.03 mg/dL (ref 0.76–1.27)
GFR calc Af Amer: 93 mL/min/{1.73_m2} (ref >59)
GFR calc non Af Amer: 81 mL/min/{1.73_m2} (ref >59)
Globulin, Total: 2.4 g/dL (ref 1.5–4.5)
Glucose: 103 mg/dL — ABNORMAL HIGH (ref 65–99)
Potassium: 4.7 mmol/L (ref 3.5–5.2)
Sodium: 140 mmol/L (ref 134–144)
Total Protein: 7.2 g/dL (ref 6.0–8.5)

## 2020-05-21 ENCOUNTER — Other Ambulatory Visit: Payer: Self-pay | Admitting: Nurse Practitioner

## 2020-07-19 ENCOUNTER — Ambulatory Visit: Payer: Self-pay | Admitting: Nurse Practitioner

## 2020-07-26 ENCOUNTER — Other Ambulatory Visit: Payer: Self-pay | Admitting: Nurse Practitioner

## 2020-08-08 ENCOUNTER — Encounter: Payer: Self-pay | Admitting: Nurse Practitioner

## 2020-08-08 ENCOUNTER — Other Ambulatory Visit: Payer: Self-pay

## 2020-08-08 ENCOUNTER — Ambulatory Visit (INDEPENDENT_AMBULATORY_CARE_PROVIDER_SITE_OTHER): Payer: 59 | Admitting: Nurse Practitioner

## 2020-08-08 ENCOUNTER — Ambulatory Visit (HOSPITAL_COMMUNITY)
Admission: RE | Admit: 2020-08-08 | Discharge: 2020-08-08 | Disposition: A | Discharge status: Home / Self Care | Payer: 59 | Source: Ambulatory Visit | Attending: Nurse Practitioner | Admitting: Nurse Practitioner

## 2020-08-08 VITALS — BP 123/89 | HR 68 | Temp 97.0°F | Resp 16 | Wt 225.0 lb

## 2020-08-08 DIAGNOSIS — G629 Polyneuropathy, unspecified: Secondary | ICD-10-CM

## 2020-08-08 DIAGNOSIS — E785 Hyperlipidemia, unspecified: Secondary | ICD-10-CM | POA: Diagnosis not present

## 2020-08-08 DIAGNOSIS — R7303 Prediabetes: Secondary | ICD-10-CM | POA: Diagnosis not present

## 2020-08-08 DIAGNOSIS — F17200 Nicotine dependence, unspecified, uncomplicated: Secondary | ICD-10-CM

## 2020-08-08 DIAGNOSIS — G8929 Other chronic pain: Secondary | ICD-10-CM | POA: Insufficient documentation

## 2020-08-08 DIAGNOSIS — M25552 Pain in left hip: Secondary | ICD-10-CM | POA: Diagnosis not present

## 2020-08-08 LAB — POCT GLYCOSYLATED HEMOGLOBIN (HGB A1C): HbA1c, POC (prediabetic range): 6.3 % (ref 5.7–6.4)

## 2020-08-08 LAB — POCT CBG (FASTING - GLUCOSE)-MANUAL ENTRY: Glucose Fasting, POC: 110 mg/dL — AB (ref 70–99)

## 2020-08-08 MED ORDER — BLOOD GLUCOSE MONITOR KIT: 1.0000 | PACK | 0 refills | Status: DC

## 2020-08-08 NOTE — Progress Notes (Signed)
Established Patient Office Visit  Subjective:  Patient ID: Alex Charles, male    DOB: 02-02-1966  Age: 55 y.o. MRN: [id number]  CC:  Chief Complaint  Patient presents with  . Hip Pain    HPI Alex Charles presents for follow up. He  has a past medical history of Alcoholism in recovery [facility]), Allergy, Anxiety, Blockage of coronary artery of heart ([place]), CHF (congestive heart failure) ([place]), Depression, Hyperlipidemia, and Prediabetes.   Hip Pain Patient complains of left hip pain. Onset of the symptoms was several years ago. Inciting event: none. The patient reports the hip pain is aggravated by walking and radiates to buttocks . Associated symptoms: none. Aggravating symptoms include: walking. Patient has had prior hip problems. Previous visits for this problem: yes, last seen 4 months ago by me. Evaluation to date: plain films, which were normal in 2016 Treatment to date: OTC analgesics, which have been not very effective.He has not problem standing. He feels like sitting has improved. He admits that with the increased walking while at work.   He admits that he diet has been off since he moved out from his parents home and he has noted that his wright is slightly elevated. He is thinking about getting his bikes back out. He know that this is an exercise that he can do and his neighborhood is ideal. He was a previous membership to [employer].   Past Medical History:  Diagnosis Date  . Alcoholism in recovery [facility])    happened 4 years ago/ went thru detox in the hospital  . Allergy    seasonal  . Anxiety   . Blockage of coronary artery of heart ([place])    1 stent put in/ in 2015  . CHF (congestive heart failure) ([place])   . Depression   . Hyperlipidemia   . Prediabetes     Past Surgical History:  Procedure Laterality Date  . CORONARY ANGIOPLASTY WITH STENT PLACEMENT  2015   one stent  . OTHER SURGICAL HISTORY    . tooth extracted      Family History  Problem Relation Age of  Onset  . Bladder Cancer Mother   . Crohn's disease Mother   . Hypertension Father   . Irritable bowel syndrome Sister     Social History   Socioeconomic History  . Marital status: Single    Spouse name: Not on file  . Number of children: Not on file  . Years of education: Not on file  . Highest education level: Not on file  Occupational History  . Not on file  Tobacco Use  . Smoking status: Current Every Day Smoker    Packs/day: 0.50    Types: Cigarettes  . Smokeless tobacco: Never Used  Vaping Use  . Vaping Use: Never used  Substance and Sexual Activity  . Alcohol use: No  . Drug use: No  . Sexual activity: Not on file  Other Topics Concern  . Not on file  Social History Narrative  . Not on file   Social Determinants of Health   Financial Resource Strain: Not on file  Food Insecurity: Not on file  Transportation Needs: Not on file  Physical Activity: Not on file  Stress: Not on file  Social Connections: Not on file  Intimate Partner Violence: Not on file    Outpatient Medications Prior to Visit  Medication Sig Dispense Refill  . aspirin EC 81 MG tablet Take 1 tablet (81 mg total) by mouth daily. 30 tablet  11  . busPIRone (BUSPAR) 15 MG tablet TAKE 1 TABLET BY MOUTH TWICE A DAY 180 tablet 2  . fenofibrate 160 MG tablet TAKE 1 TABLET BY MOUTH EVERY DAY 90 tablet 0  . gabapentin (NEURONTIN) 300 MG capsule Take 1 capsule (300 mg total) by mouth 3 (three) times daily. Needs appointment for refills! 90 capsule 0  . hydrOXYzine (ATARAX/VISTARIL) 10 MG tablet Take 1 tablet (10 mg total) by mouth 3 (three) times daily as needed. Need appointment for refills! 30 tablet 0  . ibuprofen (ADVIL,MOTRIN) 200 MG tablet Take 200 mg by mouth every 6 (six) hours as needed.    . traZODone (DESYREL) 50 MG tablet TAKE 1 TABLET (50 MG TOTAL) BY MOUTH AT BEDTIME AS NEEDED FOR SLEEP. 90 tablet 3  . acetaminophen (TYLENOL) 500 MG tablet Take 500 mg by mouth every 6 (six) hours as needed.  Reported on 12/10/2015    . ammonium lactate (AMLACTIN) 12 % lotion Apply topically daily to bottom of feet and in between toes. Avoid open areas. (Patient not taking: No sig reported) 400 g 0   Facility-Administered Medications Prior to Visit  Medication Dose Route Frequency Provider Last Rate Last Admin  . 0.9 %  sodium chloride infusion  500 mL Intravenous Once Thornton Park, MD        No Known Allergies  ROS Review of Systems  Respiratory: Positive for shortness of breath (continues to smoke some).   Cardiovascular: Negative for chest pain and palpitations.       He has noticed that increased sodium does cause some      Objective:    Physical Exam HENT:     Head: Normocephalic and atraumatic.  Cardiovascular:     Rate and Rhythm: Normal rate and regular rhythm.     Pulses: Normal pulses.     Heart sounds: Normal heart sounds.  Pulmonary:     Effort: Pulmonary effort is normal.  Musculoskeletal:     Cervical back: Normal range of motion.  Neurological:     Mental Status: He is alert.     BP 123/89   Pulse 68   Temp (!) 97 F (36.1 C)   Resp 16   Wt 225 lb (102.1 kg)   SpO2 98%   BMI 34.21 kg/m  Wt Readings from Last 3 Encounters:  08/08/20 225 lb (102.1 kg)  01/04/20 218 lb 0.2 oz (98.9 kg)  01/19/19 213 lb (96.6 kg)     There are no preventive care reminders to display for this patient.  There are no preventive care reminders to display for this patient.  Lab Results  Component Value Date   TSH 1.880 01/04/2020   Lab Results  Component Value Date   WBC 7.8 01/04/2020   HGB 16.6 01/04/2020   HCT 47.6 01/04/2020   MCV 92 01/04/2020   PLT 227 01/04/2020   Lab Results  Component Value Date   NA 140 05/16/2020   K 4.7 05/16/2020   CO2 22 09/22/2018   GLUCOSE 103 (H) 05/16/2020   BUN 16 05/16/2020   CREATININE 1.03 05/16/2020   BILITOT 0.2 05/16/2020   ALKPHOS 56 05/16/2020   AST 38 05/16/2020   ALT 35 09/22/2018   PROT 7.2 05/16/2020    ALBUMIN 4.8 05/16/2020   CALCIUM 10.4 (H) 05/16/2020   Lab Results  Component Value Date   CHOL 287 (H) 05/16/2020   Lab Results  Component Value Date   HDL 35 (L) 05/16/2020   Lab Results  Component Value Date   LDLCALC 208 (H) 05/16/2020   Lab Results  Component Value Date   TRIG 223 (H) 05/16/2020   Lab Results  Component Value Date   CHOLHDL 8.2 (H) 05/16/2020   Lab Results  Component Value Date   HGBA1C 6.3 08/08/2020      Assessment & Plan:   Problem List Items Addressed This Visit      Nervous and Auditory   Neuropathy Stable on current regimen     Other   Tobacco dependence Encourage continued weaning due to his heart history and current HLD   Prediabetes - Primary Consider home glucose monitoring Weight loss at least 5% of current body weight is can be achieved with lifestyle modification dietary changes and regular daily exercise Encourage blood pressure control goal <120/80 and maintaining total cholesterol <200 Follow-up every 3 to 6 months for reevaluation Education material provided May consider Metformin in the next 3 months.    Relevant Orders   Comp. Metabolic Panel (12)   POCT glycosylated hemoglobin (Hb A1C) (Completed)   POCT CBG (Fasting - Glucose) (Completed)    Other Visit Diagnoses    Chronic hip pain, left     Xray pending Encourage cycling and aqua-therapy    Relevant Orders   DG Hip Unilat W OR W/O Pelvis 2-3 Views Left   Hyperlipidemia, unspecified hyperlipidemia type     Lifestyle modifications discussed labs pending will adjust treatment if needed based on labs.    Relevant Orders   Lipid panel      Meds ordered this encounter  Medications  . blood glucose meter kit and supplies KIT    Sig: Inject 1 each into the skin as directed. Dispense based on patient and insurance preference. Use up to four times daily as directed. (FOR ICD-9 250.00, 250.01).    Dispense:  1 each    Refill:  0    Order Specific Question:    Supervising Provider    Answer:   Tresa Garter [id number]    Order Specific Question:   Number of strips    Answer:   100    Order Specific Question:   Number of lancets    Answer:   100    Follow-up: Return in about 3 months (around 11/05/2020) for follow up HLD 99213, prediabetes.    Vevelyn Francois, NP

## 2020-08-08 NOTE — Patient Instructions (Addendum)
Hip Pain The hip is the joint between the upper legs and the lower pelvis. The bones, cartilage, tendons, and muscles of your hip joint support your body and allow you to move around. Hip pain can range from a minor ache to severe pain in one or both of your hips. The pain may be felt on the inside of the hip joint near the groin, or on the outside near the buttocks and upper thigh. You may also have swelling or stiffness in your hip area. Follow these instructions at home: Managing pain, stiffness, and swelling  If directed, put ice on the painful area. To do this: ? Put ice in a plastic bag. ? Place a towel between your skin and the bag. ? Leave the ice on for 20 minutes, 2-3 times a day.  If directed, apply heat to the affected area as often as told by your health care provider. Use the heat source that your health care provider recommends, such as a moist heat pack or a heating pad. ? Place a towel between your skin and the heat source. ? Leave the heat on for 20-30 minutes. ? Remove the heat if your skin turns bright red. This is especially important if you are unable to feel pain, heat, or cold. You may have a greater risk of getting burned.      Activity  Do exercises as told by your health care provider.  Avoid activities that cause pain. General instructions  Take over-the-counter and prescription medicines only as told by your health care provider.  Keep a journal of your symptoms. Write down: ? How often you have hip pain. ? The location of your pain. ? What the pain feels like. ? What makes the pain worse.  Sleep with a pillow between your legs on your most comfortable side.  Keep all follow-up visits as told by your health care provider. This is important.   Contact a health care provider if:  You cannot put weight on your leg.  Your pain or swelling continues or gets worse after one week.  It gets harder to walk.  You have a fever. Get help right away  if:  You fall.  You have a sudden increase in pain and swelling in your hip.  Your hip is red or swollen or very tender to touch. Summary  Hip pain can range from a minor ache to severe pain in one or both of your hips.  The pain may be felt on the inside of the hip joint near the groin, or on the outside near the buttocks and upper thigh.  Avoid activities that cause pain.  Write down how often you have hip pain, the location of the pain, what makes it worse, and what it feels like. This information is not intended to replace advice given to you by your health care provider. Make sure you discuss any questions you have with your health care provider. Document Revised: 10/25/2018 Document Reviewed: 10/25/2018 Elsevier Patient Education  2021 [place], 44(Suppl 1), [phone]. https://doi.org/https://doi.org/10.2337/dc21-S003">   Prediabetes Prediabetes is when your blood sugar (blood glucose) level is higher than normal but not high enough for you to be diagnosed with type 2 diabetes. Having prediabetes puts you at risk for developing type 2 diabetes (type 2 diabetes mellitus). With certain lifestyle changes, you may be able to prevent or delay the onset of type 2 diabetes. This is important because type 2 diabetes can lead to serious  complications, such as: Heart disease. Stroke. Blindness. Kidney disease. Depression. Poor circulation in the feet and legs. In severe cases, this could lead to surgical removal of a leg (amputation). What are the causes? The exact cause of prediabetes is not known. It may result from insulin resistance. Insulin resistance develops when cells in the body do not respond properly to insulin that the body makes. This can cause excess glucose to build up in the blood. High blood glucose (hyperglycemia) can develop. What increases the risk? The following factors may make you more likely to develop this condition: You have a family  member with type 2 diabetes. You are older than 45 years. You had a temporary form of diabetes during a pregnancy (gestational diabetes). You had polycystic ovary syndrome (PCOS). You are overweight or obese. You are inactive (sedentary). You have a history of heart disease, including problems with cholesterol levels, high levels of blood fats, or high blood pressure. What are the signs or symptoms? You may have no symptoms. If you do have symptoms, they may include: Increased hunger. Increased thirst. Increased urination. Vision changes, such as blurry vision. Tiredness (fatigue). How is this diagnosed? This condition can be diagnosed with blood tests. Your blood glucose may be checked with one or more of the following tests: A fasting blood glucose (FBG) test. You will not be allowed to eat (you will fast) for at least 8 hours before a blood sample is taken. An A1C blood test (hemoglobin A1C). This test provides information about blood glucose levels over the previous 2?3 months. An oral glucose tolerance test (OGTT). This test measures your blood glucose at two points in time: After fasting. This is your baseline level. Two hours after you drink a beverage that contains glucose. You may be diagnosed with prediabetes if: Your FBG is 100?125 mg/dL (5.6-6.9 mmol/L). Your A1C level is 5.7?6.4% (39-46 mmol/mol). Your OGTT result is 140?199 mg/dL (7.8-11 mmol/L). These blood tests may be repeated to confirm your diagnosis.   How is this treated? Treatment may include dietary and lifestyle changes to help lower your blood glucose and prevent type 2 diabetes from developing. In some cases, medicine may be prescribed to help lower the risk of type 2 diabetes. Follow these instructions at home: Nutrition Follow a healthy meal plan. This includes eating lean proteins, whole grains, legumes, fresh fruits and vegetables, low-fat dairy products, and healthy fats. Follow instructions from your  health care provider about eating or drinking restrictions. Meet with a dietitian to create a healthy eating plan that is right for you.   Lifestyle Do moderate-intensity exercise for at least 30 minutes a day on 5 or more days each week, or as told by your health care provider. A mix of activities may be best, such as: Brisk walking, swimming, biking, and weight lifting. Lose weight as told by your health care provider. Losing 5-7% of your body weight can reverse insulin resistance. Do not drink alcohol if: Your health care provider tells you not to drink. You are pregnant, may be pregnant, or are planning to become pregnant. If you drink alcohol: Limit how much you use to: 0-1 drink a day for women. 0-2 drinks a day for men. Be aware of how much alcohol is in your drink. In the [place] one drink equals one 12 oz bottle of beer (355 mL), one 5 oz glass of wine (148 mL), or one 1 oz glass of hard liquor (44 mL). General instructions Take over-the-counter and prescription medicines  only as told by your health care provider. You may be prescribed medicines that help lower the risk of type 2 diabetes. Do not use any products that contain nicotine or tobacco, such as cigarettes, e-cigarettes, and chewing tobacco. If you need help quitting, ask your health care provider. Keep all follow-up visits. This is important. Where to find more information American Diabetes Association: www.diabetes.org Academy of Nutrition and Dietetics: www.eatright.org American Heart Association: www.heart.org Contact a health care provider if: You have any of these symptoms: Increased hunger. Increased urination. Increased thirst. Fatigue. Vision changes, such as blurry vision. Get help right away if you: Have shortness of breath. Feel confused. Vomit or feel like you may vomit. Summary Prediabetes is when your blood sugar (blood glucose)level is higher than normal but not high enough for you to be diagnosed  with type 2 diabetes. Having prediabetes puts you at risk for developing type 2 diabetes (type 2 diabetes mellitus). Make lifestyle changes such as eating a healthy diet and exercising regularly to help prevent diabetes. Lose weight as told by your health care provider. This information is not intended to replace advice given to you by your health care provider. Make sure you discuss any questions you have with your health care provider. Document Revised: 09/08/2019 Document Reviewed: 09/08/2019 Elsevier Patient Education  2021 [place].  Prediabetes Eating Plan Prediabetes is a condition that causes blood sugar (glucose) levels to be higher than normal. This increases the risk for developing type 2 diabetes (type 2 diabetes mellitus). Working with a health care provider or nutrition specialist (dietitian) to make diet and lifestyle changes can help prevent the onset of diabetes. These changes may help you:  Control your blood glucose levels.  Improve your cholesterol levels.  Manage your blood pressure. What are tips for following this plan? Reading food labels  Read food labels to check the amount of fat, salt (sodium), and sugar in prepackaged foods. Avoid foods that have: ? Saturated fats. ? Trans fats. ? Added sugars.  Avoid foods that have more than 300 milligrams (mg) of sodium per serving. Limit your sodium intake to less than 2,300 mg each day. Shopping  Avoid buying pre-made and processed foods.  Avoid buying drinks with added sugar. Cooking  Cook with olive oil. Do not use butter, lard, or ghee.  Bake, broil, grill, steam, or boil foods. Avoid frying. Meal planning  Work with your dietitian to create an eating plan that is right for you. This may include tracking how many calories you take in each day. Use a food diary, notebook, or mobile application to track what you eat at each meal.  Consider following a Mediterranean diet. This includes: ? Eating several  servings of fresh fruits and vegetables each day. ? Eating fish at least twice a week. ? Eating one serving each day of whole grains, beans, nuts, and seeds. ? Using olive oil instead of other fats. ? Limiting alcohol. ? Limiting red meat. ? Using nonfat or low-fat dairy products.  Consider following a plant-based diet. This includes dietary choices that focus on eating mostly vegetables and fruit, grains, beans, nuts, and seeds.  If you have high blood pressure, you may need to limit your sodium intake or follow a diet such as the DASH (Dietary Approaches to Stop Hypertension) eating plan. The DASH diet aims to lower high blood pressure.   Lifestyle  Set weight loss goals with help from your health care team. It is recommended that most people with prediabetes  lose 7% of their body weight.  Exercise for at least 30 minutes 5 or more days a week.  Attend a support group or seek support from a mental health counselor.  Take over-the-counter and prescription medicines only as told by your health care provider. What foods are recommended? Fruits Berries. Bananas. Apples. Oranges. Grapes. Papaya. Mango. Pomegranate. Kiwi. Grapefruit. Cherries. Vegetables Lettuce. Spinach. Peas. Beets. Cauliflower. Cabbage. Broccoli. Carrots. Tomatoes. Squash. Eggplant. Herbs. Peppers. Onions. Cucumbers. Brussels sprouts. Grains Whole grains, such as whole-wheat or whole-grain breads, crackers, cereals, and pasta. Unsweetened oatmeal. Bulgur. Barley. Quinoa. Brown rice. Corn or whole-wheat flour tortillas or taco shells. Meats and other proteins Seafood. Poultry without skin. Lean cuts of pork and beef. Tofu. Eggs. Nuts. Beans. Dairy Low-fat or fat-free dairy products, such as yogurt, cottage cheese, and cheese. Beverages Water. Tea. Coffee. Sugar-free or diet soda. Seltzer water. Low-fat or nonfat milk. Milk alternatives, such as soy or almond milk. Fats and oils Olive oil. Canola oil. Sunflower oil.  Grapeseed oil. Avocado. Walnuts. Sweets and desserts Sugar-free or low-fat pudding. Sugar-free or low-fat ice cream and other frozen treats. Seasonings and condiments Herbs. Sodium-free spices. Mustard. Relish. Low-salt, low-sugar ketchup. Low-salt, low-sugar barbecue sauce. Low-fat or fat-free mayonnaise. The items listed above may not be a complete list of recommended foods and beverages. Contact a dietitian for more information. What foods are not recommended? Fruits Fruits canned with syrup. Vegetables Canned vegetables. Frozen vegetables with butter or cream sauce. Grains Refined white flour and flour products, such as bread, pasta, snack foods, and cereals. Meats and other proteins Fatty cuts of meat. Poultry with skin. Breaded or fried meat. Processed meats. Dairy Full-fat yogurt, cheese, or milk. Beverages Sweetened drinks, such as iced tea and soda. Fats and oils Butter. Lard. Ghee. Sweets and desserts Baked goods, such as cake, cupcakes, pastries, cookies, and cheesecake. Seasonings and condiments Spice mixes with added salt. Ketchup. Barbecue sauce. Mayonnaise. The items listed above may not be a complete list of foods and beverages that are not recommended. Contact a dietitian for more information. Where to find more information  American Diabetes Association: www.diabetes.org Summary  You may need to make diet and lifestyle changes to help prevent the onset of diabetes. These changes can help you control blood sugar, improve cholesterol levels, and manage blood pressure.  Set weight loss goals with help from your health care team. It is recommended that most people with prediabetes lose 7% of their body weight.  Consider following a Mediterranean diet. This includes eating plenty of fresh fruits and vegetables, whole grains, beans, nuts, seeds, fish, and low-fat dairy, and using olive oil instead of other fats. This information is not intended to replace advice given  to you by your health care provider. Make sure you discuss any questions you have with your health care provider. Document Revised: 09/08/2019 Document Reviewed: 09/08/2019 Elsevier Patient Education  [place].

## 2020-08-08 NOTE — Progress Notes (Signed)
arthritis pain, left hip  Pain can become a 7-8 /10 Does not take OTC medication.

## 2020-08-09 LAB — COMP. METABOLIC PANEL (12)
AST: 39 IU/L (ref 0–40)
Albumin/Globulin Ratio: 1.7 (ref 1.2–2.2)
Albumin: 4.7 g/dL (ref 3.8–4.9)
Alkaline Phosphatase: 58 IU/L (ref 44–121)
BUN/Creatinine Ratio: 13 (ref 9–20)
BUN: 15 mg/dL (ref 6–24)
Bilirubin Total: 0.4 mg/dL (ref 0.0–1.2)
Calcium: 10.4 mg/dL — ABNORMAL HIGH (ref 8.7–10.2)
Chloride: 102 mmol/L (ref 96–106)
Creatinine, Ser: 1.2 mg/dL (ref 0.76–1.27)
GFR calc Af Amer: 78 mL/min/{1.73_m2} (ref >59)
GFR calc non Af Amer: 67 mL/min/{1.73_m2} (ref >59)
Globulin, Total: 2.7 g/dL (ref 1.5–4.5)
Glucose: 88 mg/dL (ref 65–99)
Potassium: 4.7 mmol/L (ref 3.5–5.2)
Sodium: 137 mmol/L (ref 134–144)
Total Protein: 7.4 g/dL (ref 6.0–8.5)

## 2020-08-09 LAB — LIPID PANEL
Chol/HDL Ratio: 7.5 ratio — ABNORMAL HIGH (ref 0.0–5.0)
Cholesterol, Total: 271 mg/dL — ABNORMAL HIGH (ref 100–199)
HDL: 36 mg/dL — ABNORMAL LOW (ref >39)
LDL Chol Calc (NIH): 198 mg/dL — ABNORMAL HIGH (ref 0–99)
Triglycerides: 194 mg/dL — ABNORMAL HIGH (ref 0–149)
VLDL Cholesterol Cal: 37 mg/dL (ref 5–40)

## 2020-08-09 MED ORDER — BLOOD GLUCOSE MONITOR KIT: 1.0000 | PACK | 0 refills | Status: DC

## 2020-08-09 MED ORDER — BLOOD GLUCOSE MONITOR KIT: PACK | 0 refills | Status: DC

## 2020-08-09 MED ORDER — BLOOD GLUCOSE MONITOR KIT
1.0000 | PACK | 0 refills | Status: DC
Start: 2020-08-09 — End: 2020-08-09

## 2020-08-09 NOTE — Addendum Note (Signed)
Addended by: Amado Coe on: 08/09/2020 11:09 AM   Modules accepted: Orders

## 2020-10-01 ENCOUNTER — Other Ambulatory Visit: Payer: Self-pay | Admitting: Nurse Practitioner

## 2020-10-01 DIAGNOSIS — F411 Generalized anxiety disorder: Secondary | ICD-10-CM

## 2020-10-01 DIAGNOSIS — F32A Depression, unspecified: Secondary | ICD-10-CM

## 2020-10-29 ENCOUNTER — Other Ambulatory Visit: Payer: Self-pay | Admitting: Nurse Practitioner

## 2020-11-07 ENCOUNTER — Other Ambulatory Visit: Payer: Self-pay

## 2020-11-07 ENCOUNTER — Ambulatory Visit (INDEPENDENT_AMBULATORY_CARE_PROVIDER_SITE_OTHER): Payer: 59 | Admitting: Nurse Practitioner

## 2020-11-07 VITALS — BP 131/89 | HR 89 | Temp 97.5°F | Ht 68.0 in | Wt 218.0 lb

## 2020-11-07 DIAGNOSIS — F419 Anxiety disorder, unspecified: Secondary | ICD-10-CM

## 2020-11-07 DIAGNOSIS — I5022 Chronic systolic (congestive) heart failure: Secondary | ICD-10-CM | POA: Diagnosis not present

## 2020-11-07 DIAGNOSIS — G629 Polyneuropathy, unspecified: Secondary | ICD-10-CM | POA: Diagnosis not present

## 2020-11-07 DIAGNOSIS — E785 Hyperlipidemia, unspecified: Secondary | ICD-10-CM | POA: Diagnosis not present

## 2020-11-07 DIAGNOSIS — I739 Peripheral vascular disease, unspecified: Secondary | ICD-10-CM

## 2020-11-07 DIAGNOSIS — F1021 Alcohol dependence, in remission: Secondary | ICD-10-CM

## 2020-11-07 MED ORDER — GABAPENTIN 300 MG PO CAPS
300.0000 mg | ORAL_CAPSULE | Freq: Three times a day (TID) | ORAL | 0 refills | Status: DC
Start: 2020-11-07 — End: 2020-12-06

## 2020-11-07 MED ORDER — HYDROXYZINE HCL 10 MG PO TABS: 10.0000 mg | ORAL_TABLET | Freq: Three times a day (TID) | ORAL | 0 refills | Status: DC | PRN

## 2020-11-07 NOTE — Progress Notes (Signed)
[address] [place], [place]  [address] Phone:  [phone]   Fax:  [phone]   Established Patient Office Visit  Subjective:  Patient ID: Alex Charles, male    DOB: [birth year]  Age: 55 y.o. MRN: [id number]  CC:  Chief Complaint  Patient presents with  . Follow-up    3 month follow up, requesting refill on gabapentin and  hydroxyzine     HPI Alex Charles presents for follow up. He  has a past medical history of Alcoholism in recovery [facility]), Allergy, Anxiety, Blockage of coronary artery of heart ([place]), CHF (congestive heart failure) ([place]), Depression, Hyperlipidemia, and Prediabetes.   He who presents for follow-up of dyslipidemia. A repeat fasting lipid profile was done. The patient does not use medications that may worsen dyslipidemias (corticosteroids, progestins, anabolic steroids, diuretics, beta-blockers, amiodarone, cyclosporine, olanzapine). Exercise: intermittently.  He does continue to work full-time and has 2 to a lot of walking and standing. He has made significant lifestyle changes and has had a 7 pound weight loss into his diet changes.  Previous history of cardiac disease includes: CHF Vascular Disease.  Cardiac Risk Factors Age > 45-male, > 55-male:  YES  +1  Smoking:   YES  +1  Sig. family hx of CHD*:  NO  Hypertension:   NO  Diabetes:   NO  HDL < 35:   NO  HDL > 59:   NO  Total:  2   He continues to have left hip pain. Previous xray OA with some progression since 2016.  Past Medical History:  Diagnosis Date  . Alcoholism in recovery [facility])    happened 4 years ago/ went thru detox in the hospital  . Allergy    seasonal  . Anxiety   . Blockage of coronary artery of heart ([place])    1 stent put in/ in 2015  . CHF (congestive heart failure) ([place])   . Depression   . Hyperlipidemia   . Prediabetes     Past Surgical History:  Procedure Laterality Date  . CORONARY ANGIOPLASTY WITH STENT PLACEMENT  2015   one stent   . OTHER SURGICAL HISTORY    . tooth extracted      Family History  Problem Relation Age of Onset  . Bladder Cancer Mother   . Crohn's disease Mother   . Hypertension Father   . Irritable bowel syndrome Sister     Social History   Socioeconomic History  . Marital status: Single    Spouse name: Not on file  . Number of children: Not on file  . Years of education: Not on file  . Highest education level: Not on file  Occupational History  . Not on file  Tobacco Use  . Smoking status: Current Every Day Smoker    Packs/day: 0.50    Types: Cigarettes  . Smokeless tobacco: Never Used  Vaping Use  . Vaping Use: Never used  Substance and Sexual Activity  . Alcohol use: No  . Drug use: No  . Sexual activity: Not on file  Other Topics Concern  . Not on file  Social History Narrative  . Not on file   Social Determinants of Health   Financial Resource Strain: Not on file  Food Insecurity: Not on file  Transportation Needs: Not on file  Physical Activity: Not on file  Stress: Not on file  Social Connections: Not on file  Intimate Partner Violence: Not on file  Outpatient Medications Prior to Visit  Medication Sig Dispense Refill  . acetaminophen (TYLENOL) 500 MG tablet Take 500 mg by mouth every 6 (six) hours as needed. Reported on 12/10/2015    . aspirin EC 81 MG tablet Take 1 tablet (81 mg total) by mouth daily. 30 tablet 11  . blood glucose meter kit and supplies KIT Use up to four times daily as directed dx e11.9 1 each 0  . busPIRone (BUSPAR) 15 MG tablet TAKE 1 TABLET BY MOUTH 2 (TWO) TIMES DAILY. 180 tablet 2  . fenofibrate 160 MG tablet TAKE 1 TABLET BY MOUTH EVERY DAY 90 tablet 0  . ibuprofen (ADVIL,MOTRIN) 200 MG tablet Take 200 mg by mouth every 6 (six) hours as needed.    . traZODone (DESYREL) 50 MG tablet TAKE 1 TABLET (50 MG TOTAL) BY MOUTH AT BEDTIME AS NEEDED FOR SLEEP. 90 tablet 3  . gabapentin (NEURONTIN) 300 MG capsule Take 1 capsule (300 mg total) by  mouth 3 (three) times daily. Needs appointment for refills! 90 capsule 0  . hydrOXYzine (ATARAX/VISTARIL) 10 MG tablet Take 1 tablet (10 mg total) by mouth 3 (three) times daily as needed. Need appointment for refills! 30 tablet 0   Facility-Administered Medications Prior to Visit  Medication Dose Route Frequency Provider Last Rate Last Admin  . 0.9 %  sodium chloride infusion  500 mL Intravenous Once [place], MD        No Known Allergies  ROS Review of Systems  Skin: Positive for rash.      Objective:    Physical Exam Constitutional:      Appearance: He is obese.  HENT:     Head: Normocephalic.     Nose: Nose normal.     Mouth/Throat:     Mouth: Mucous membranes are moist.  Cardiovascular:     Rate and Rhythm: Normal rate and regular rhythm.     Pulses: Normal pulses.     Heart sounds: Normal heart sounds.  Pulmonary:     Effort: Pulmonary effort is normal.     Breath sounds: Normal breath sounds.  Abdominal:     Palpations: Abdomen is soft.  Musculoskeletal:     Cervical back: Normal range of motion.     Right lower leg: No edema.     Left lower leg: No edema.  Skin:    General: Skin is warm and dry.     Capillary Refill: Capillary refill takes less than 2 seconds.  Neurological:     General: No focal deficit present.     Mental Status: He is alert and oriented to person, place, and time.  Psychiatric:        Mood and Affect: Mood normal.        Behavior: Behavior normal.        Thought Content: Thought content normal.        Judgment: Judgment normal.     BP 131/89   Pulse 89   Temp (!) 97.5 F (36.4 C)   Ht '5\' 8"'  (1.727 m)   Wt 218 lb 0.6 oz (98.9 kg)   SpO2 100%   BMI 33.15 kg/m  Wt Readings from Last 3 Encounters:  11/07/20 218 lb 0.6 oz (98.9 kg)  08/08/20 225 lb (102.1 kg)  01/04/20 218 lb 0.2 oz (98.9 kg)     There are no preventive care reminders to display for this patient.  There are no preventive care reminders to display  for this patient.  Lab  Results  Component Value Date   TSH 1.880 01/04/2020   Lab Results  Component Value Date   WBC 7.8 01/04/2020   HGB 16.6 01/04/2020   HCT 47.6 01/04/2020   MCV 92 01/04/2020   PLT 227 01/04/2020   Lab Results  Component Value Date   NA 143 11/07/2020   K 4.7 11/07/2020   CO2 22 09/22/2018   GLUCOSE 102 (H) 11/07/2020   BUN 18 11/07/2020   CREATININE 1.18 11/07/2020   BILITOT 0.3 11/07/2020   ALKPHOS 58 11/07/2020   AST 41 (H) 11/07/2020   ALT 35 09/22/2018   PROT 7.4 11/07/2020   ALBUMIN 5.0 (H) 11/07/2020   CALCIUM 10.5 (H) 11/07/2020   EGFR 72 11/07/2020   Lab Results  Component Value Date   CHOL 267 (H) 11/07/2020   Lab Results  Component Value Date   HDL 39 (L) 11/07/2020   Lab Results  Component Value Date   LDLCALC 190 (H) 11/07/2020   Lab Results  Component Value Date   TRIG 201 (H) 11/07/2020   Lab Results  Component Value Date   CHOLHDL 6.8 (H) 11/07/2020   Lab Results  Component Value Date   HGBA1C 6.3 08/08/2020      Assessment & Plan:   Problem List Items Addressed This Visit      Cardiovascular and Mediastinum   PVD (peripheral vascular disease) with claudication ([place]) Stable Encourage smoking cessation   Relevant Medications   gabapentin (NEURONTIN) 300 MG capsule   Chronic systolic heart failure (H[facility]) Stable Encouraged healthy diet and regular exercise     Nervous and Auditory   Neuropathy Persistent we will continue with current regimen   Relevant Medications   gabapentin (NEURONTIN) 300 MG capsule   Other Relevant Orders   Comp. Metabolic Panel (12) (Completed)     Other   Alcoholism in remission ([place]) Stable    Other Visit Diagnoses    Hyperlipidemia, unspecified hyperlipidemia type    -  Primary Labs pending currently on fenofibrate 160 mg daily Continue to encourage healthy diet and regular exercise   Relevant Orders   Lipid panel (Completed)   Anxiety    Stable with current regimen we  will continue to monitor   Relevant Medications   hydrOXYzine (ATARAX/VISTARIL) 10 MG tablet      Meds ordered this encounter  Medications  . gabapentin (NEURONTIN) 300 MG capsule    Sig: Take 1 capsule (300 mg total) by mouth 3 (three) times daily. Needs appointment for refills!    Dispense:  90 capsule    Refill:  0    Order Specific Question:   Supervising Provider    Answer:   Tresa Garter [id number]  . hydrOXYzine (ATARAX/VISTARIL) 10 MG tablet    Sig: Take 1 tablet (10 mg total) by mouth 3 (three) times daily as needed. Need appointment for refills!    Dispense:  30 tablet    Refill:  0    Order Specific Question:   Supervising Provider    Answer:   Tresa Garter [id number]    Follow-up: Return in about 3 months (around 02/07/2021).    Vevelyn Francois, NP

## 2020-11-07 NOTE — Patient Instructions (Addendum)
Preventing High Cholesterol Cholesterol is a white, waxy substance similar to fat that the human body needs to help build cells. The liver makes all the cholesterol that a person's body needs. Having high cholesterol (hypercholesterolemia) increases your risk for heart disease and stroke. Extra or excess cholesterol comes from the food that you eat. High cholesterol can often be prevented with diet and lifestyle changes. If you already have high cholesterol, you can control it with diet, lifestyle changes, and medicines. How can high cholesterol affect me? If you have high cholesterol, fatty deposits (plaques) may build up on the walls of your blood vessels. The blood vessels that carry blood away from your heart are called arteries. Plaques make the arteries narrower and stiffer. This in turn can:  Restrict or block blood flow and cause blood clots to form.  Increase your risk for heart attack and stroke. What can increase my risk for high cholesterol? This condition is more likely to develop in people who:  Eat foods that are high in saturated fat or cholesterol. Saturated fat is mostly found in foods that come from animal sources.  Are overweight.  Are not getting enough exercise.  Have a family history of high cholesterol (familial hypercholesterolemia). What actions can I take to prevent this? Nutrition  Eat less saturated fat.  Avoid trans fats (partially hydrogenated oils). These are often found in margarine and in some baked goods, fried foods, and snacks bought in packages.  Avoid precooked or cured meat, such as bacon, sausages, or meat loaves.  Avoid foods and drinks that have added sugars.  Eat more fruits, vegetables, and whole grains.  Choose healthy sources of protein, such as fish, poultry, lean cuts of red meat, beans, peas, lentils, and nuts.  Choose healthy sources of fat, such as: ? Nuts. ? Vegetable oils, especially olive oil. ? Fish that have healthy fats,  such as omega-3 fatty acids. These fish include mackerel or salmon.   Lifestyle  Lose weight if you are overweight. Maintaining a healthy body mass index (BMI) can help prevent or control high cholesterol. It can also lower your risk for diabetes and high blood pressure. Ask your health care provider to help you with a diet and exercise plan to lose weight safely.  Do not use any products that contain nicotine or tobacco, such as cigarettes, e-cigarettes, and chewing tobacco. If you need help quitting, ask your health care provider. Alcohol use  Do not drink alcohol if: ? Your health care provider tells you not to drink. ? You are pregnant, may be pregnant, or are planning to become pregnant.  If you drink alcohol: ? Limit how much you use to:  0-1 drink a day for women.  0-2 drinks a day for men. ? Be aware of how much alcohol is in your drink. In the [place] one drink equals one 12 oz bottle of beer (355 mL), one 5 oz glass of wine (148 mL), or one 1 oz glass of hard liquor (44 mL). Activity  Get enough exercise. Do exercises as told by your health care provider.  Each week, do at least 150 minutes of exercise that takes a medium level of effort (moderate-intensity exercise). This kind of exercise: ? Makes your heart beat faster while allowing you to still be able to talk. ? Can be done in short sessions several times a day or longer sessions a few times a week. For example, on 5 days each week, you could walk fast or ride   your bike 3 times a day for 10 minutes each time.   Medicines  Your health care provider may recommend medicines to help lower cholesterol. This may be a medicine to lower the amount of cholesterol that your liver makes. You may need medicine if: ? Diet and lifestyle changes have not lowered your cholesterol enough. ? You have high cholesterol and other risk factors for heart disease or stroke.  Take over-the-counter and prescription medicines only as told by your  health care provider. General information  Manage your risk factors for high cholesterol. Talk with your health care provider about all your risk factors and how to lower your risk.  Manage other conditions that you have, such as diabetes or high blood pressure (hypertension).  Have blood tests to check your cholesterol levels at regular points in time as told by your health care provider.  Keep all follow-up visits as told by your health care provider. This is important. Where to find more information  American Heart Association: www.heart.org  National Heart, Lung, and Blood Institute: https://wilson-eaton.com/ Summary  High cholesterol increases your risk for heart disease and stroke. By keeping your cholesterol level low, you can reduce your risk for these conditions.  High cholesterol can often be prevented with diet and lifestyle changes.  Work with your health care provider to manage your risk factors, and have your blood tested regularly. This information is not intended to replace advice given to you by your health care provider. Make sure you discuss any questions you have with your health care provider. Document Revised: 03/22/2019 Document Reviewed: 03/22/2019 Elsevier Patient Education  [place].    Hip Exercises Ask your health care provider which exercises are safe for you. Do exercises exactly as told by your health care provider and adjust them as directed. It is normal to feel mild stretching, pulling, tightness, or discomfort as you do these exercises. Stop right away if you feel sudden pain or your pain gets worse. Do not begin these exercises until told by your health care provider. Stretching and range-of-motion exercises These exercises warm up your muscles and joints and improve the movement and flexibility of your hip. These exercises also help to relieve pain, numbness, and tingling. You may be asked to limit your range of motion if you had a hip  replacement. Talk to your health care provider about these restrictions. Hamstrings, supine 1. Lie on your back (supine position). 2. Loop a belt or towel over the ball of your left / right foot. The ball of your foot is on the walking surface, right under your toes. 3. Straighten your left / right knee and slowly pull on the belt or towel to raise your leg until you feel a gentle stretch behind your knee (hamstring). ? Do not let your knee bend while you do this. ? Keep your other leg flat on the floor. 4. Hold this position for __________ seconds. 5. Slowly return your leg to the starting position. Repeat __________ times. Complete this exercise __________ times a day.   Hip rotation 1. Lie on your back on a firm surface. 2. With your left / right hand, gently pull your left / right knee toward the shoulder that is on the same side of the body. Stop when your knee is pointing toward the ceiling. 3. Hold your left / right ankle with your other hand. 4. Keeping your knee steady, gently pull your left / right ankle toward your other shoulder until you feel  a stretch in your buttocks. ? Keep your hips and shoulders firmly planted while you do this stretch. 5. Hold this position for __________ seconds. Repeat __________ times. Complete this exercise __________ times a day.   Seated stretch This exercise is sometimes called hamstrings and adductors stretch. 1. Sit on the floor with your legs stretched wide. Keep your knees straight during this exercise. 2. Keeping your head and back in a straight line, bend at your waist to reach for your left foot (position A). You should feel a stretch in your right inner thigh (adductors). 3. Hold this position for __________ seconds. Then slowly return to the upright position. 4. Keeping your head and back in a straight line, bend at your waist to reach forward (position B). You should feel a stretch behind both of your thighs and knees (hamstrings). 5. Hold  this position for __________ seconds. Then slowly return to the upright position. 6. Keeping your head and back in a straight line, bend at your waist to reach for your right foot (position C). You should feel a stretch in your left inner thigh (adductors). 7. Hold this position for __________ seconds. Then slowly return to the upright position. Repeat __________ times. Complete this exercise __________ times a day.   Lunge This exercise stretches the muscles of the hip (hip flexors). 1. Place your left / right knee on the floor and bend your other knee so that is directly over your ankle. You should be half-kneeling. 2. Keep good posture with your head over your shoulders. 3. Tighten your buttocks to point your tailbone downward. This will prevent your back from arching too much. 4. You should feel a gentle stretch in the front of your left / right thigh and hip. If you do not feel a stretch, slide your other foot forward slightly and then slowly lunge forward with your chest up until your knee once again lines up over your ankle. ? Make sure your tailbone continues to point downward. 5. Hold this position for __________ seconds. 6. Slowly return to the starting position. Repeat __________ times. Complete this exercise __________ times a day.   Strengthening exercises These exercises build strength and endurance in your hip. Endurance is the ability to use your muscles for a long time, even after they get tired. Bridge This exercise strengthens the muscles of your hip (hip extensors). 1. Lie on your back on a firm surface with your knees bent and your feet flat on the floor. 2. Tighten your buttocks muscles and lift your bottom off the floor until the trunk of your body and your hips are level with your thighs. ? Do not arch your back. ? You should feel the muscles working in your buttocks and the back of your thighs. If you do not feel these muscles, slide your feet 1-2 inches (2.5-5 cm)  farther away from your buttocks. 3. Hold this position for __________ seconds. 4. Slowly lower your hips to the starting position. 5. Let your muscles relax completely between repetitions. Repeat __________ times. Complete this exercise __________ times a day.   Straight leg raises, side-lying This exercise strengthens the muscles that move the hip joint away from the center of the body (hip abductors). 1. Lie on your side with your left / right leg in the top position. Lie so your head, shoulder, hip, and knee line up. You may bend your bottom knee slightly to help you balance. 2. Roll your hips slightly forward, so your hips are  stacked directly over each other and your left / right knee is facing forward. 3. Leading with your heel, lift your top leg 4-6 inches (10-15 cm). You should feel the muscles in your top hip lifting. ? Do not let your foot drift forward. ? Do not let your knee roll toward the ceiling. 4. Hold this position for __________ seconds. 5. Slowly return to the starting position. 6. Let your muscles relax completely between repetitions. Repeat __________ times. Complete this exercise __________ times a day.   Straight leg raises, side-lying This exercise strengthens the muscles that move the hip joint toward the center of the body (hip adductors). 1. Lie on your side with your left / right leg in the bottom position. Lie so your head, shoulder, hip, and knee line up. You may place your upper foot in front to help you balance. 2. Roll your hips slightly forward, so your hips are stacked directly over each other and your left / right knee is facing forward. 3. Tense the muscles in your inner thigh and lift your bottom leg 4-6 inches (10-15 cm). 4. Hold this position for __________ seconds. 5. Slowly return to the starting position. 6. Let your muscles relax completely between repetitions. Repeat __________ times. Complete this exercise __________ times a day.   Straight leg  raises, supine This exercise strengthens the muscles in the front of your thigh (quadriceps). 1. Lie on your back (supine position) with your left / right leg extended and your other knee bent. 2. Tense the muscles in the front of your left / right thigh. You should see your kneecap slide up or see increased dimpling just above your knee. 3. Keep these muscles tight as you raise your leg 4-6 inches (10-15 cm) off the floor. Do not let your knee bend. 4. Hold this position for __________ seconds. 5. Keep these muscles tense as you lower your leg. 6. Relax the muscles slowly and completely between repetitions. Repeat __________ times. Complete this exercise __________ times a day.   Hip abductors, standing This exercise strengthens the muscles that move the leg and hip joint away from the center of the body (hip abductors). 1. Tie one end of a rubber exercise band or tubing to a secure surface, such as a chair, table, or pole. 2. Loop the other end of the band or tubing around your left / right ankle. 3. Keeping your ankle with the band or tubing directly opposite the secured end, step away until there is tension in the tubing or band. Hold on to a chair, table, or pole as needed for balance. 4. Lift your left / right leg out to your side. While you do this: ? Keep your back upright. ? Keep your shoulders over your hips. ? Keep your toes pointing forward. ? Make sure to use your hip muscles to slowly lift your leg. Do not tip your body or forcefully lift your leg. 5. Hold this position for __________ seconds. 6. Slowly return to the starting position. Repeat __________ times. Complete this exercise __________ times a day. Squats This exercise strengthens the muscles in the front of your thigh (quadriceps). 1. Stand in a door frame so your feet and knees are in line with the frame. You may place your hands on the frame for balance. 2. Slowly bend your knees and lower your hips like you are  going to sit in a chair. ? Keep your lower legs in a straight-up-and-down position. ? Do not let your  hips go lower than your knees. ? Do not bend your knees lower than told by your health care provider. ? If your hip pain increases, do not bend as low. 3. Hold this position for ___________ seconds. 4. Slowly push with your legs to return to standing. Do not use your hands to pull yourself to standing. Repeat __________ times. Complete this exercise __________ times a day. This information is not intended to replace advice given to you by your health care provider. Make sure you discuss any questions you have with your health care provider. Document Revised: 01/13/2019 Document Reviewed: 04/20/2018 Elsevier Patient Education  2021 [place].    Seborrheic Keratosis A seborrheic keratosis is a common, noncancerous (benign) skin growth. These growths are velvety, waxy, rough, tan, brown, or black spots that appear on the skin. These skin growths can be flat or raised, and scaly. What are the causes? The cause of this condition is not known. What increases the risk? You are more likely to develop this condition if you:  Have a family history of seborrheic keratosis.  Are 50 or older.  Are pregnant.  Have had estrogen replacement therapy. What are the signs or symptoms? Symptoms of this condition include growths on the face, chest, shoulders, back, or other areas. These growths:  Are usually painless, but may become irritated and itchy.  Can be yellow, brown, black, or other colors.  Are slightly raised or have a flat surface.  Are sometimes rough or wart-like in texture.  Are often velvety or waxy on the surface.  Are round or oval-shaped.  Often occur in groups, but may occur as a single growth.   How is this diagnosed? This condition is diagnosed with a medical history and physical exam.  A sample of the growth may be tested (skin biopsy).  You may need to see a  skin specialist (dermatologist). How is this treated? Treatment is not usually needed for this condition, unless the growths are irritated or bleed often.  You may also choose to have the growths removed if you do not like their appearance. ? Most commonly, these growths are treated with a procedure in which liquid nitrogen is applied to "freeze" off the growth (cryosurgery). ? They may also be burned off with electricity (electrocautery) or removed by scraping (curettage). Follow these instructions at home:  Watch your growth for any changes.  Keep all follow-up visits as told by your health care provider. This is important.  Do not scratch or pick at the growth or growths. This can cause them to become irritated or infected. Contact a health care provider if:  You suddenly have many new growths.  Your growth bleeds, itches, or hurts.  Your growth suddenly becomes larger or changes color. Summary  A seborrheic keratosis is a common, noncancerous (benign) skin growth.  Treatment is not usually needed for this condition, unless the growths are irritated or bleed often.  Watch your growth for any changes.  Contact a health care provider if you suddenly have many new growths or your growth suddenly becomes larger or changes color.  Keep all follow-up visits as told by your health care provider. This is important. This information is not intended to replace advice given to you by your health care provider. Make sure you discuss any questions you have with your health care provider. Document Revised: 10/22/2017 Document Reviewed: 10/22/2017 Elsevier Patient Education  2021 [employer].

## 2020-11-08 LAB — COMP. METABOLIC PANEL (12)
AST: 41 IU/L — ABNORMAL HIGH (ref 0–40)
Albumin/Globulin Ratio: 2.1 (ref 1.2–2.2)
Albumin: 5 g/dL — ABNORMAL HIGH (ref 3.8–4.9)
Alkaline Phosphatase: 58 IU/L (ref 44–121)
BUN/Creatinine Ratio: 15 (ref 9–20)
BUN: 18 mg/dL (ref 6–24)
Bilirubin Total: 0.3 mg/dL (ref 0.0–1.2)
Calcium: 10.5 mg/dL — ABNORMAL HIGH (ref 8.7–10.2)
Chloride: 103 mmol/L (ref 96–106)
Creatinine, Ser: 1.18 mg/dL (ref 0.76–1.27)
Globulin, Total: 2.4 g/dL (ref 1.5–4.5)
Glucose: 102 mg/dL — ABNORMAL HIGH (ref 65–99)
Potassium: 4.7 mmol/L (ref 3.5–5.2)
Sodium: 143 mmol/L (ref 134–144)
Total Protein: 7.4 g/dL (ref 6.0–8.5)
eGFR: 72 mL/min/{1.73_m2} (ref >59)

## 2020-11-08 LAB — LIPID PANEL
Chol/HDL Ratio: 6.8 ratio — ABNORMAL HIGH (ref 0.0–5.0)
Cholesterol, Total: 267 mg/dL — ABNORMAL HIGH (ref 100–199)
HDL: 39 mg/dL — ABNORMAL LOW (ref >39)
LDL Chol Calc (NIH): 190 mg/dL — ABNORMAL HIGH (ref 0–99)
Triglycerides: 201 mg/dL — ABNORMAL HIGH (ref 0–149)
VLDL Cholesterol Cal: 38 mg/dL (ref 5–40)

## 2020-11-09 ENCOUNTER — Encounter: Payer: Self-pay | Admitting: Nurse Practitioner

## 2020-12-05 ENCOUNTER — Other Ambulatory Visit: Payer: Self-pay | Admitting: Nurse Practitioner

## 2020-12-05 DIAGNOSIS — G629 Polyneuropathy, unspecified: Secondary | ICD-10-CM

## 2021-01-24 ENCOUNTER — Other Ambulatory Visit: Payer: Self-pay | Admitting: Nurse Practitioner

## 2021-02-13 ENCOUNTER — Other Ambulatory Visit: Payer: Self-pay

## 2021-02-13 ENCOUNTER — Encounter: Payer: Self-pay | Admitting: Nurse Practitioner

## 2021-02-13 ENCOUNTER — Ambulatory Visit (INDEPENDENT_AMBULATORY_CARE_PROVIDER_SITE_OTHER): Payer: 59 | Admitting: Nurse Practitioner

## 2021-02-13 VITALS — BP 113/79 | HR 70 | Temp 97.4°F | Ht 68.0 in | Wt 216.0 lb

## 2021-02-13 DIAGNOSIS — E785 Hyperlipidemia, unspecified: Secondary | ICD-10-CM | POA: Diagnosis not present

## 2021-02-13 DIAGNOSIS — E782 Mixed hyperlipidemia: Secondary | ICD-10-CM

## 2021-02-13 DIAGNOSIS — F419 Anxiety disorder, unspecified: Secondary | ICD-10-CM

## 2021-02-13 DIAGNOSIS — E781 Pure hyperglyceridemia: Secondary | ICD-10-CM | POA: Diagnosis not present

## 2021-02-13 DIAGNOSIS — R7303 Prediabetes: Secondary | ICD-10-CM | POA: Diagnosis not present

## 2021-02-13 DIAGNOSIS — F17209 Nicotine dependence, unspecified, with unspecified nicotine-induced disorders: Secondary | ICD-10-CM

## 2021-02-13 LAB — POCT URINALYSIS DIPSTICK
Bilirubin, UA: NEGATIVE
Blood, UA: NEGATIVE
Glucose, UA: NEGATIVE
Ketones, UA: NEGATIVE
Leukocytes, UA: NEGATIVE
Nitrite, UA: NEGATIVE
Protein, UA: NEGATIVE
Spec Grav, UA: 1.02 (ref 1.010–1.025)
Urobilinogen, UA: 0.2 E.U./dL
pH, UA: 7 (ref 5.0–8.0)

## 2021-02-13 LAB — POCT GLYCOSYLATED HEMOGLOBIN (HGB A1C): Hemoglobin A1C: 5.9 % — AB (ref 4.0–5.6)

## 2021-02-13 MED ORDER — HYDROXYZINE HCL 10 MG PO TABS: 10.0000 mg | ORAL_TABLET | Freq: Three times a day (TID) | ORAL | 5 refills | Status: DC | PRN

## 2021-02-13 MED ORDER — HYDROXYZINE HCL 10 MG PO TABS: 10.0000 mg | ORAL_TABLET | Freq: Three times a day (TID) | ORAL | 0 refills | Status: DC | PRN

## 2021-02-13 NOTE — Progress Notes (Signed)
[place], [place]  [address] Phone:  [phone]   Fax:  [phone]   Established Patient Office Visit  Subjective:  Patient ID: Alex Charles, male    DOB: 01-25-1966  Age: 55 y.o. MRN: [id number]  CC: No chief complaint on file.   HPI Alex Charles presents for follow up. He  has a past medical history of Alcoholism in recovery [facility]), Allergy, Anxiety, Blockage of coronary artery of heart ([place]), CHF (congestive heart failure) ([place]), Depression, Hyperlipidemia, and Prediabetes.   He is in today for follow-up.  He has a history of heart failure and peripheral vascular disease however he does not follow cardiology.  He also has a history of prediabetes.  He has been working on changing his diet and increasing his exercise.  He does continue to have improvement in his weight.  He is down about 9 pounds over the last 6 months.  He also continues to work on his smoking.  He also has hyperlipidemia for which she is currently being treated with fenofibrate.  Again he is making conscious decisions on changing his diet to avoid things that are high in fat and cholesterol.  He continues to have hip pain but is encouraged that there are some stretching exercises that are beneficial for the short-term.  He is aware that he is not a problem at the recommended stretches and exercises, however he is trying.  Overall is anxiety is stable.  He is a little preoccupied by at upcoming visitation related to his job.  He is hopeful however that will go well.  Past Medical History:  Diagnosis Date   Alcoholism in recovery [facility])    happened 4 years ago/ went thru detox in the hospital   Allergy    seasonal   Anxiety    Blockage of coronary artery of heart (HCC)    1 stent put in/ in 2015   CHF (congestive heart failure) ([place])    Depression    Hyperlipidemia    Prediabetes     Past Surgical History:  Procedure Laterality Date   CORONARY  ANGIOPLASTY WITH STENT PLACEMENT  2015   one stent   OTHER SURGICAL HISTORY     tooth extracted      Family History  Problem Relation Age of Onset   Bladder Cancer Mother    Crohn's disease Mother    Hypertension Father    Irritable bowel syndrome Sister     Social History   Socioeconomic History   Marital status: Single    Spouse name: Not on file   Number of children: Not on file   Years of education: Not on file   Highest education level: Not on file  Occupational History   Not on file  Tobacco Use   Smoking status: Every Day    Packs/day: 0.50    Types: Cigarettes   Smokeless tobacco: Never  Vaping Use   Vaping Use: Never used  Substance and Sexual Activity   Alcohol use: No   Drug use: No   Sexual activity: Not on file  Other Topics Concern   Not on file  Social History Narrative   Not on file   Social Determinants of Health   Financial Resource Strain: Not on file  Food Insecurity: Not on file  Transportation Needs: Not on file  Physical Activity: Not on file  Stress: Not on file  Social Connections: Not on file  Intimate Partner Violence: Not  on file    Outpatient Medications Prior to Visit  Medication Sig Dispense Refill   acetaminophen (TYLENOL) 500 MG tablet Take 500 mg by mouth every 6 (six) hours as needed. Reported on 12/10/2015     aspirin EC 81 MG tablet Take 1 tablet (81 mg total) by mouth daily. 30 tablet 11   blood glucose meter kit and supplies KIT Use up to four times daily as directed dx e11.9 1 each 0   busPIRone (BUSPAR) 15 MG tablet TAKE 1 TABLET BY MOUTH 2 (TWO) TIMES DAILY. 180 tablet 2   fenofibrate 160 MG tablet TAKE 1 TABLET BY MOUTH EVERY DAY 90 tablet 0   gabapentin (NEURONTIN) 300 MG capsule TAKE 1 CAPSULE (300 MG TOTAL) BY MOUTH 3 (THREE) TIMES DAILY. NEEDS APPOINTMENT FOR REFILLS! 90 capsule 3   ibuprofen (ADVIL,MOTRIN) 200 MG tablet Take 200 mg by mouth every 6 (six) hours as needed.     traZODone (DESYREL) 50 MG tablet  TAKE 1 TABLET (50 MG TOTAL) BY MOUTH AT BEDTIME AS NEEDED FOR SLEEP. 90 tablet 3   hydrOXYzine (ATARAX/VISTARIL) 10 MG tablet Take 1 tablet (10 mg total) by mouth 3 (three) times daily as needed. Need appointment for refills! 30 tablet 0   Facility-Administered Medications Prior to Visit  Medication Dose Route Frequency Provider Last Rate Last Admin   0.9 %  sodium chloride infusion  500 mL Intravenous Once Thornton Park, MD        No Known Allergies  ROS Review of Systems    Objective:    Physical Exam Constitutional:      Appearance: He is obese.  HENT:     Head: Normocephalic and atraumatic.     Nose: Nose normal.     Mouth/Throat:     Mouth: Mucous membranes are moist.  Cardiovascular:     Rate and Rhythm: Normal rate and regular rhythm.     Pulses: Normal pulses.     Heart sounds: Normal heart sounds.  Pulmonary:     Effort: Pulmonary effort is normal.     Comments: Diminished  Abdominal:     Palpations: Abdomen is soft.  Musculoskeletal:        General: Normal range of motion.     Cervical back: Normal range of motion.     Right lower leg: No edema.     Left lower leg: No edema.  Skin:    General: Skin is warm and dry.     Capillary Refill: Capillary refill takes less than 2 seconds.  Neurological:     General: No focal deficit present.     Mental Status: He is alert and oriented to person, place, and time.  Psychiatric:        Mood and Affect: Mood normal.        Behavior: Behavior normal.        Thought Content: Thought content normal.        Judgment: Judgment normal.    BP 113/79 (BP Location: Left Arm, Patient Position: Sitting)   Pulse 70   Temp (!) 97.4 F (36.3 C)   Ht '5\' 8"'  (1.727 m)   Wt 216 lb 0.6 oz (98 kg)   SpO2 95%   BMI 32.85 kg/m  Wt Readings from Last 3 Encounters:  02/13/21 216 lb 0.6 oz (98 kg)  11/07/20 218 lb 0.6 oz (98.9 kg)  08/08/20 225 lb (102.1 kg)     Health Maintenance Due  Topic Date Due   Pneumococcal  Vaccine 72-23 Years old (1 - PCV) Never done   Zoster Vaccines- Shingrix (2 of 2) 03/17/2019   COVID-19 Vaccine (4 - Booster for Moderna series) 10/30/2020    There are no preventive care reminders to display for this patient.  Lab Results  Component Value Date   TSH 1.880 01/04/2020   Lab Results  Component Value Date   WBC 7.8 01/04/2020   HGB 16.6 01/04/2020   HCT 47.6 01/04/2020   MCV 92 01/04/2020   PLT 227 01/04/2020   Lab Results  Component Value Date   NA 143 11/07/2020   K 4.7 11/07/2020   CO2 22 09/22/2018   GLUCOSE 102 (H) 11/07/2020   BUN 18 11/07/2020   CREATININE 1.18 11/07/2020   BILITOT 0.3 11/07/2020   ALKPHOS 58 11/07/2020   AST 41 (H) 11/07/2020   ALT 35 09/22/2018   PROT 7.4 11/07/2020   ALBUMIN 5.0 (H) 11/07/2020   CALCIUM 10.5 (H) 11/07/2020   EGFR 72 11/07/2020   Lab Results  Component Value Date   CHOL 267 (H) 11/07/2020   Lab Results  Component Value Date   HDL 39 (L) 11/07/2020   Lab Results  Component Value Date   LDLCALC 190 (H) 11/07/2020   Lab Results  Component Value Date   TRIG 201 (H) 11/07/2020   Lab Results  Component Value Date   CHOLHDL 6.8 (H) 11/07/2020   Lab Results  Component Value Date   HGBA1C 5.9 (A) 02/13/2021      Assessment & Plan:   Problem List Items Addressed This Visit       Other   Prediabetes - Primary Continue to encourage weight loss at least 5% of current body weight is can be achieved with lifestyle modification dietary changes and regular daily exercise Encourage blood pressure control goal <120/80 and maintaining total cholesterol <200 Follow-up every 3 to 6 months for reevaluation Education material provided    Relevant Orders   Urinalysis Dipstick (Completed)   HgB A1c (Completed)   Comp. Metabolic Panel (12) (Completed)   Other Visit Diagnoses     Anxiety     Stable we will continue to monitor Continue with current regimen.  No changes warranted. Good patient compliance.     Relevant Medications   hydrOXYzine (ATARAX/VISTARIL) 10 MG tablet   Hypertriglyceridemia     Persistent will make additional changes based on lab results   Relevant Orders   Lipid panel (Completed)   Hyperlipidemia, unspecified hyperlipidemia type       Relevant Orders   Lipid panel (Completed)   Tobacco use disorder, continuous     Continues however improving with slow steady steps   Multiple-type hyperlipidemia           Meds ordered this encounter  Medications   DISCONTD: hydrOXYzine (ATARAX/VISTARIL) 10 MG tablet    Sig: Take 1 tablet (10 mg total) by mouth 3 (three) times daily as needed.    Dispense:  30 tablet    Refill:  0   hydrOXYzine (ATARAX/VISTARIL) 10 MG tablet    Sig: Take 1 tablet (10 mg total) by mouth 3 (three) times daily as needed.    Dispense:  90 tablet    Refill:  5    Order Specific Question:   Supervising Provider    Answer:   Tresa Garter [id number]    Follow-up: Return in about 3 months (around 05/16/2021) for follow up prediabetes and HLD 99213.    Vevelyn Francois, NP

## 2021-02-13 NOTE — Patient Instructions (Signed)
Prediabetes Eating Plan °Prediabetes is a condition that causes blood sugar (glucose) levels to be higher than normal. This increases the risk for developing type 2 diabetes (type 2 diabetes mellitus). Working with a health care provider or nutrition specialist (dietitian) to make diet and lifestyle changes can help prevent the onset of diabetes. These changes may help you: °Control your blood glucose levels. °Improve your cholesterol levels. °Manage your blood pressure. °What are tips for following this plan? °Reading food labels °Read food labels to check the amount of fat, salt (sodium), and sugar in prepackaged foods. Avoid foods that have: °Saturated fats. °Trans fats. °Added sugars. °Avoid foods that have more than 300 milligrams (mg) of sodium per serving. Limit your sodium intake to less than 2,300 mg each day. °Shopping °Avoid buying pre-made and processed foods. °Avoid buying drinks with added sugar. °Cooking °Cook with olive oil. Do not use butter, lard, or ghee. °Bake, broil, grill, steam, or boil foods. Avoid frying. °Meal planning ° °Work with your dietitian to create an eating plan that is right for you. This may include tracking how many calories you take in each day. Use a food diary, notebook, or mobile application to track what you eat at each meal. °Consider following a Mediterranean diet. This includes: °Eating several servings of fresh fruits and vegetables each day. °Eating fish at least twice a week. °Eating one serving each day of whole grains, beans, nuts, and seeds. °Using olive oil instead of other fats. °Limiting alcohol. °Limiting red meat. °Using nonfat or low-fat dairy products. °Consider following a plant-based diet. This includes dietary choices that focus on eating mostly vegetables and fruit, grains, beans, nuts, and seeds. °If you have high blood pressure, you may need to limit your sodium intake or follow a diet such as the DASH (Dietary Approaches to Stop Hypertension) eating  plan. The DASH diet aims to lower high blood pressure. °Lifestyle °Set weight loss goals with help from your health care team. It is recommended that most people with prediabetes lose 7% of their body weight. °Exercise for at least 30 minutes 5 or more days a week. °Attend a support group or seek support from a mental health counselor. °Take over-the-counter and prescription medicines only as told by your health care provider. °What foods are recommended? °Fruits °Berries. Bananas. Apples. Oranges. Grapes. Papaya. Mango. Pomegranate. Kiwi. Grapefruit. Cherries. °Vegetables °Lettuce. Spinach. Peas. Beets. Cauliflower. Cabbage. Broccoli. Carrots. Tomatoes. Squash. Eggplant. Herbs. Peppers. Onions. Cucumbers. Brussels sprouts. °Grains °Whole grains, such as whole-wheat or whole-grain breads, crackers, cereals, and pasta. Unsweetened oatmeal. Bulgur. Barley. Quinoa. Brown rice. Corn or whole-wheat flour tortillas or taco shells. °Meats and other proteins °Seafood. Poultry without skin. Lean cuts of pork and beef. Tofu. Eggs. Nuts. Beans. °Dairy °Low-fat or fat-free dairy products, such as yogurt, cottage cheese, and cheese. °Beverages °Water. Tea. Coffee. Sugar-free or diet soda. Seltzer water. Low-fat or nonfat milk. Milk alternatives, such as soy or almond milk. °Fats and oils °Olive oil. Canola oil. Sunflower oil. Grapeseed oil. Avocado. Walnuts. °Sweets and desserts °Sugar-free or low-fat pudding. Sugar-free or low-fat ice cream and other frozen treats. °Seasonings and condiments °Herbs. Sodium-free spices. Mustard. Relish. Low-salt, low-sugar ketchup. Low-salt, low-sugar barbecue sauce. Low-fat or fat-free mayonnaise. °The items listed above may not be a complete list of recommended foods and beverages. Contact a dietitian for more information. °What foods are not recommended? °Fruits °Fruits canned with syrup. °Vegetables °Canned vegetables. Frozen vegetables with butter or cream sauce. °Grains °Refined white  flour and flour   products, such as bread, pasta, snack foods, and cereals. °Meats and other proteins °Fatty cuts of meat. Poultry with skin. Breaded or fried meat. Processed meats. °Dairy °Full-fat yogurt, cheese, or milk. °Beverages °Sweetened drinks, such as iced tea and soda. °Fats and oils °Butter. Lard. Ghee. °Sweets and desserts °Baked goods, such as cake, cupcakes, pastries, cookies, and cheesecake. °Seasonings and condiments °Spice mixes with added salt. Ketchup. Barbecue sauce. Mayonnaise. °The items listed above may not be a complete list of foods and beverages that are not recommended. Contact a dietitian for more information. °Where to find more information °American Diabetes Association: www.diabetes.org °Summary °You may need to make diet and lifestyle changes to help prevent the onset of diabetes. These changes can help you control blood sugar, improve cholesterol levels, and manage blood pressure. °Set weight loss goals with help from your health care team. It is recommended that most people with prediabetes lose 7% of their body weight. °Consider following a Mediterranean diet. This includes eating plenty of fresh fruits and vegetables, whole grains, beans, nuts, seeds, fish, and low-fat dairy, and using olive oil instead of other fats. °This information is not intended to replace advice given to you by your health care provider. Make sure you discuss any questions you have with your health care provider. °Document Revised: 09/08/2019 Document Reviewed: 09/08/2019 °Elsevier Patient Education © 2022 Elsevier Inc. ° °

## 2021-02-14 LAB — COMP. METABOLIC PANEL (12)
AST: 34 IU/L (ref 0–40)
Albumin/Globulin Ratio: 2.1 (ref 1.2–2.2)
Albumin: 4.6 g/dL (ref 3.8–4.9)
Alkaline Phosphatase: 50 IU/L (ref 44–121)
BUN/Creatinine Ratio: 12 (ref 9–20)
BUN: 15 mg/dL (ref 6–24)
Bilirubin Total: 0.4 mg/dL (ref 0.0–1.2)
Calcium: 10 mg/dL (ref 8.7–10.2)
Chloride: 105 mmol/L (ref 96–106)
Creatinine, Ser: 1.23 mg/dL (ref 0.76–1.27)
Globulin, Total: 2.2 g/dL (ref 1.5–4.5)
Glucose: 81 mg/dL (ref 65–99)
Potassium: 4.5 mmol/L (ref 3.5–5.2)
Sodium: 140 mmol/L (ref 134–144)
Total Protein: 6.8 g/dL (ref 6.0–8.5)
eGFR: 68 mL/min/{1.73_m2} (ref >59)

## 2021-02-14 LAB — LIPID PANEL
Chol/HDL Ratio: 8.4 ratio — ABNORMAL HIGH (ref 0.0–5.0)
Cholesterol, Total: 251 mg/dL — ABNORMAL HIGH (ref 100–199)
HDL: 30 mg/dL — ABNORMAL LOW (ref >39)
LDL Chol Calc (NIH): 179 mg/dL — ABNORMAL HIGH (ref 0–99)
Triglycerides: 219 mg/dL — ABNORMAL HIGH (ref 0–149)
VLDL Cholesterol Cal: 42 mg/dL — ABNORMAL HIGH (ref 5–40)

## 2021-02-15 ENCOUNTER — Encounter: Payer: Self-pay | Admitting: Nurse Practitioner

## 2021-02-20 ENCOUNTER — Other Ambulatory Visit: Payer: Self-pay | Admitting: Nurse Practitioner

## 2021-02-20 MED ORDER — ROSUVASTATIN CALCIUM 20 MG PO TABS: 20.0000 mg | ORAL_TABLET | Freq: Every evening | ORAL | 3 refills | Status: DC

## 2021-02-21 NOTE — Progress Notes (Signed)
Left voicemail to call back to results.

## 2021-03-15 ENCOUNTER — Other Ambulatory Visit: Payer: Self-pay | Admitting: Nurse Practitioner

## 2021-03-15 DIAGNOSIS — F419 Anxiety disorder, unspecified: Secondary | ICD-10-CM

## 2021-03-15 DIAGNOSIS — G4700 Insomnia, unspecified: Secondary | ICD-10-CM

## 2021-03-15 NOTE — Telephone Encounter (Signed)
He has refills  Please call pharmacy to see what the problem is

## 2021-04-12 ENCOUNTER — Encounter: Payer: Self-pay | Admitting: Gastroenterology

## 2021-05-01 ENCOUNTER — Other Ambulatory Visit: Payer: Self-pay | Admitting: Nurse Practitioner

## 2021-05-22 ENCOUNTER — Other Ambulatory Visit: Payer: Self-pay

## 2021-05-22 ENCOUNTER — Ambulatory Visit (INDEPENDENT_AMBULATORY_CARE_PROVIDER_SITE_OTHER): Payer: 59 | Admitting: Nurse Practitioner

## 2021-05-22 ENCOUNTER — Encounter: Payer: Self-pay | Admitting: Nurse Practitioner

## 2021-05-22 VITALS — BP 125/83 | HR 69 | Temp 97.7°F | Wt 217.4 lb

## 2021-05-22 DIAGNOSIS — Z125 Encounter for screening for malignant neoplasm of prostate: Secondary | ICD-10-CM

## 2021-05-22 DIAGNOSIS — E785 Hyperlipidemia, unspecified: Secondary | ICD-10-CM

## 2021-05-22 DIAGNOSIS — F17200 Nicotine dependence, unspecified, uncomplicated: Secondary | ICD-10-CM | POA: Diagnosis not present

## 2021-05-22 DIAGNOSIS — G8929 Other chronic pain: Secondary | ICD-10-CM

## 2021-05-22 DIAGNOSIS — I739 Peripheral vascular disease, unspecified: Secondary | ICD-10-CM

## 2021-05-22 DIAGNOSIS — M25552 Pain in left hip: Secondary | ICD-10-CM

## 2021-05-22 DIAGNOSIS — R7303 Prediabetes: Secondary | ICD-10-CM | POA: Diagnosis not present

## 2021-05-22 LAB — POCT URINALYSIS DIP (CLINITEK)
Bilirubin, UA: NEGATIVE
Blood, UA: NEGATIVE
Glucose, UA: NEGATIVE mg/dL
Ketones, POC UA: NEGATIVE mg/dL
Leukocytes, UA: NEGATIVE
Nitrite, UA: NEGATIVE
POC PROTEIN,UA: NEGATIVE
Spec Grav, UA: 1.02 (ref 1.010–1.025)
Urobilinogen, UA: 0.2 E.U./dL
pH, UA: 7 (ref 5.0–8.0)

## 2021-05-22 LAB — POCT GLYCOSYLATED HEMOGLOBIN (HGB A1C)
HbA1c POC (<> result, manual entry): 6.3 % (ref 4.0–5.6)
HbA1c, POC (controlled diabetic range): 6.3 % (ref 0.0–7.0)
HbA1c, POC (prediabetic range): 6.3 % (ref 5.7–6.4)
Hemoglobin A1C: 6.3 % — AB (ref 4.0–5.6)

## 2021-05-22 NOTE — Patient Instructions (Signed)

## 2021-05-22 NOTE — Progress Notes (Signed)
[name], [name]  [address] Phone:  [phone]   Fax:  [phone]   Established Patient Office Visit  Subjective:  Patient ID: Alex Charles, male    DOB: 03/11/1966  Age: 55 y.o. MRN: [id number]  CC:  Chief Complaint  Patient presents with   Follow-up    Pt is here for a 3 month FU. Pt has no issues or concerns    HPI Alex Charles presents for follow up. He  has a past medical history of Alcoholism in recovery [facility]), Allergy, Anxiety, Blockage of coronary artery of heart ([place]), CHF (congestive heart failure) ([place]), Depression, Hyperlipidemia, and Prediabetes.   He is in today for follow-up for his hyperlipidemia.  He reports compliance with his medication however his diet has not been ideal due to his work schedule and the holidays etc.  He has maintained his weight.  His exercise ability is limited due to his chronic hip and leg pain. He does work full-time and has been having to perform additional duties due to [employer].  This does not cause him to have to work through pain which he has adapted to however sometimes is very difficult. Denies headache, dizziness, visual changes, shortness of breath, dyspnea on exertion, chest pain, nausea, vomiting or any edema.    Past Medical History:  Diagnosis Date   Alcoholism in recovery [facility])    happened 4 years ago/ went thru detox in the hospital   Allergy    seasonal   Anxiety    Blockage of coronary artery of heart (HCC)    1 stent put in/ in 2015   CHF (congestive heart failure) ([place])    Depression    Hyperlipidemia    Prediabetes     Past Surgical History:  Procedure Laterality Date   CORONARY ANGIOPLASTY WITH STENT PLACEMENT  2015   one stent   OTHER SURGICAL HISTORY     tooth extracted      Family History  Problem Relation Age of Onset   Bladder Cancer Mother    Crohn's disease Mother    Hypertension Father    Irritable bowel syndrome Sister     Social  History   Socioeconomic History   Marital status: Single    Spouse name: Not on file   Number of children: Not on file   Years of education: Not on file   Highest education level: Not on file  Occupational History   Not on file  Tobacco Use   Smoking status: Every Day    Packs/day: 0.50    Types: Cigarettes   Smokeless tobacco: Never  Vaping Use   Vaping Use: Never used  Substance and Sexual Activity   Alcohol use: No   Drug use: No   Sexual activity: Not on file  Other Topics Concern   Not on file  Social History Narrative   Not on file   Social Determinants of Health   Financial Resource Strain: Not on file  Food Insecurity: Not on file  Transportation Needs: Not on file  Physical Activity: Not on file  Stress: Not on file  Social Connections: Not on file  Intimate Partner Violence: Not on file    Outpatient Medications Prior to Visit  Medication Sig Dispense Refill   acetaminophen (TYLENOL) 500 MG tablet Take 500 mg by mouth every 6 (six) hours as needed. Reported on 12/10/2015     aspirin EC 81 MG tablet Take 1 tablet (81  mg total) by mouth daily. 30 tablet 11   busPIRone (BUSPAR) 15 MG tablet TAKE 1 TABLET BY MOUTH 2 (TWO) TIMES DAILY. 180 tablet 2   fenofibrate 160 MG tablet TAKE 1 TABLET BY MOUTH EVERY DAY 90 tablet 0   gabapentin (NEURONTIN) 300 MG capsule TAKE 1 CAPSULE (300 MG TOTAL) BY MOUTH 3 (THREE) TIMES DAILY. NEEDS APPOINTMENT FOR REFILLS! 90 capsule 3   hydrOXYzine (ATARAX/VISTARIL) 10 MG tablet TAKE 1 TABLET BY MOUTH THREE TIMES A DAY AS NEEDED 270 tablet 2   ibuprofen (ADVIL,MOTRIN) 200 MG tablet Take 200 mg by mouth every 6 (six) hours as needed.     rosuvastatin (CRESTOR) 20 MG tablet Take 1 tablet (20 mg total) by mouth every evening. 90 tablet 3   traZODone (DESYREL) 50 MG tablet TAKE 1 TABLET (50 MG TOTAL) BY MOUTH AT BEDTIME AS NEEDED FOR SLEEP. 90 tablet 3   blood glucose meter kit and supplies KIT Use up to four times daily as directed dx  e11.9 (Patient not taking: Reported on 05/22/2021) 1 each 0   Facility-Administered Medications Prior to Visit  Medication Dose Route Frequency Provider Last Rate Last Admin   0.9 %  sodium chloride infusion  500 mL Intravenous Once [place] Park, MD        No Known Allergies  ROS Review of Systems    Objective:    Physical Exam Constitutional:      General: He is not in acute distress.    Appearance: He is obese. He is not ill-appearing, toxic-appearing or diaphoretic.  HENT:     Head: Normocephalic and atraumatic.     Nose: Nose normal.     Mouth/Throat:     Mouth: Mucous membranes are moist.  Cardiovascular:     Rate and Rhythm: Normal rate and regular rhythm.     Pulses: Normal pulses.     Heart sounds: Normal heart sounds.  Pulmonary:     Effort: Pulmonary effort is normal.     Comments: Diminished Abdominal:     Palpations: Abdomen is soft.  Musculoskeletal:        General: Normal range of motion.     Cervical back: Normal range of motion.  Skin:    General: Skin is warm and dry.     Capillary Refill: Capillary refill takes less than 2 seconds.  Neurological:     General: No focal deficit present.     Mental Status: He is alert and oriented to person, place, and time.  Psychiatric:        Mood and Affect: Mood normal.        Behavior: Behavior normal.        Thought Content: Thought content normal.        Judgment: Judgment normal.    BP 125/83   Pulse 69   Temp 97.7 F (36.5 C)   Wt 217 lb 6 oz (98.6 kg)   SpO2 98%   BMI 33.05 kg/m  Wt Readings from Last 3 Encounters:  05/22/21 217 lb 6 oz (98.6 kg)  02/13/21 216 lb 0.6 oz (98 kg)  11/07/20 218 lb 0.6 oz (98.9 kg)     Health Maintenance Due  Topic Date Due   Pneumococcal Vaccine 86-50 Years old (1 - PCV) Never done   Zoster Vaccines- Shingrix (2 of 2) 03/17/2019   COLONOSCOPY (Pts 45-67yr Insurance coverage will need to be confirmed)  04/14/2021    There are no preventive care  reminders to display for this  patient.  Lab Results  Component Value Date   TSH 1.880 01/04/2020   Lab Results  Component Value Date   WBC 7.8 01/04/2020   HGB 16.6 01/04/2020   HCT 47.6 01/04/2020   MCV 92 01/04/2020   PLT 227 01/04/2020   Lab Results  Component Value Date   NA 140 02/13/2021   K 4.5 02/13/2021   CO2 22 09/22/2018   GLUCOSE 81 02/13/2021   BUN 15 02/13/2021   CREATININE 1.23 02/13/2021   BILITOT 0.4 02/13/2021   ALKPHOS 50 02/13/2021   AST 34 02/13/2021   ALT 35 09/22/2018   PROT 6.8 02/13/2021   ALBUMIN 4.6 02/13/2021   CALCIUM 10.0 02/13/2021   EGFR 68 02/13/2021   Lab Results  Component Value Date   CHOL 251 (H) 02/13/2021   Lab Results  Component Value Date   HDL 30 (L) 02/13/2021   Lab Results  Component Value Date   LDLCALC 179 (H) 02/13/2021   Lab Results  Component Value Date   TRIG 219 (H) 02/13/2021   Lab Results  Component Value Date   CHOLHDL 8.4 (H) 02/13/2021   Lab Results  Component Value Date   HGBA1C 6.3 (A) 05/22/2021   HGBA1C 6.3 05/22/2021   HGBA1C 6.3 05/22/2021   HGBA1C 6.3 05/22/2021      Assessment & Plan:   Problem List Items Addressed This Visit       Cardiovascular and Mediastinum   PVD (peripheral vascular disease) with claudication ([place]).   Stable Discussed smoking sensation     Other   Prediabetes Persistent current A1c 6.3% which is up from 5.9% Consider home glucose monitoring Weight loss at least 5% of current body weight is can be achieved with lifestyle modification dietary changes and regular daily exercise Encourage blood pressure control goal <120/80 and maintaining total cholesterol <200 Follow-up every 3 to 6 months for reevaluation Education material provided    Relevant Orders   HgB A1c (Completed)   POCT URINALYSIS DIP (CLINITEK) (Completed)   Comp. Metabolic Panel (12)   Tobacco dependence   Other Visit Diagnoses     Hyperlipidemia, unspecified hyperlipidemia type    -   Primary Persistent Continue with current regimen. Good patient compliance. Labs pending for additional changes    Relevant Orders   Lipid panel   Screening PSA (prostate specific antigen)       Relevant Orders   PSA   Chronic hip pain, left   Persistent with flare ups Tylenol, ibuprofen and gabapentin not completely effective. Would like further evaluation may benefit from steroid injection to help with ongoing pain Referral placed   Relevant Orders   AMB referral to orthopedics       No orders of the defined types were placed in this encounter.   Follow-up: Return in about 3 months (around 08/20/2021) for follow up prediabetes and HLD 99213.    Vevelyn Francois, NP

## 2021-05-23 LAB — COMP. METABOLIC PANEL (12)
AST: 30 IU/L (ref 0–40)
Albumin/Globulin Ratio: 2.2 (ref 1.2–2.2)
Albumin: 5 g/dL — ABNORMAL HIGH (ref 3.8–4.9)
Alkaline Phosphatase: 60 IU/L (ref 44–121)
BUN/Creatinine Ratio: 15 (ref 9–20)
BUN: 15 mg/dL (ref 6–24)
Bilirubin Total: <0.2 mg/dL (ref 0.0–1.2)
Calcium: 10.3 mg/dL — ABNORMAL HIGH (ref 8.7–10.2)
Chloride: 103 mmol/L (ref 96–106)
Creatinine, Ser: 1.01 mg/dL (ref 0.76–1.27)
Globulin, Total: 2.3 g/dL (ref 1.5–4.5)
Glucose: 85 mg/dL (ref 70–99)
Potassium: 4.8 mmol/L (ref 3.5–5.2)
Sodium: 140 mmol/L (ref 134–144)
Total Protein: 7.3 g/dL (ref 6.0–8.5)
eGFR: 87 mL/min/{1.73_m2} (ref >59)

## 2021-05-23 LAB — PSA: Prostate Specific Ag, Serum: 0.6 ng/mL (ref 0.0–4.0)

## 2021-05-23 LAB — LIPID PANEL
Chol/HDL Ratio: 4.8 ratio (ref 0.0–5.0)
Cholesterol, Total: 168 mg/dL (ref 100–199)
HDL: 35 mg/dL — ABNORMAL LOW (ref >39)
LDL Chol Calc (NIH): 102 mg/dL — ABNORMAL HIGH (ref 0–99)
Triglycerides: 178 mg/dL — ABNORMAL HIGH (ref 0–149)
VLDL Cholesterol Cal: 31 mg/dL (ref 5–40)

## 2021-05-29 ENCOUNTER — Ambulatory Visit: Payer: 59 | Admitting: Orthopaedic Surgery

## 2021-05-29 ENCOUNTER — Other Ambulatory Visit: Payer: Self-pay

## 2021-05-29 ENCOUNTER — Encounter: Payer: Self-pay | Admitting: Orthopaedic Surgery

## 2021-05-29 DIAGNOSIS — M1612 Unilateral primary osteoarthritis, left hip: Secondary | ICD-10-CM

## 2021-05-29 MED ORDER — CELECOXIB 200 MG PO CAPS: 200.0000 mg | ORAL_CAPSULE | Freq: Two times a day (BID) | ORAL | 3 refills | Status: DC

## 2021-05-29 NOTE — Progress Notes (Signed)
Office Visit Note   Patient: Alex Charles           Date of Birth: 1965/12/27           MRN: [id number] Visit Date: 05/29/2021              Requested by: Alex Francois, NP [address] [place],  [place] [address] PCP: Alex Francois, NP   Assessment & Plan: Visit Diagnoses:  1. Primary osteoarthritis of left hip     Plan: Impression is left hip osteoarthritis.  His disease is still on the milder side.  We talked about multiple nonsurgical strategies to lessen the pain including weight loss, strengthening, prescription NSAIDs, intra-articular injections.  He would like to start with home exercises and a prescription for Celebrex.  He has my card.  Follow-up as needed.  Follow-Up Instructions: No follow-ups on file.   Orders:  No orders of the defined types were placed in this encounter.  Meds ordered this encounter  Medications   celecoxib (CELEBREX) 200 MG capsule    Sig: Take 1 capsule (200 mg total) by mouth 2 (two) times daily.    Dispense:  30 capsule    Refill:  3      Procedures: No procedures performed   Clinical Data: No additional findings.   Subjective: Chief Complaint  Patient presents with   Left Hip - New Patient (Initial Visit)    Alex Charles is a very pleasant 55 year old gentleman who works as a Actor at [employer] comes in for chronic left hip pain for about a year.  Denies any injuries.  Denies any radicular symptoms.  He is unable to walk more than 50 feet without pain and limping.  He does take Advil which is effective.  He has noticed that because they are asking him to do more at work from a physical standpoint he has noticed more symptoms.     Review of Systems  Constitutional: Negative.   All other systems reviewed and are negative.   Objective: Vital Signs: There were no vitals taken for this visit.  Physical Exam Vitals and nursing note reviewed.  Constitutional:      Appearance: He is well-developed.   Pulmonary:     Effort: Pulmonary effort is normal.  Abdominal:     Palpations: Abdomen is soft.  Skin:    General: Skin is warm.  Neurological:     Mental Status: He is alert and oriented to person, place, and time.  Psychiatric:        Behavior: Behavior normal.        Thought Content: Thought content normal.        Judgment: Judgment normal.    Ortho Exam  Left hip exam shows mild discomfort with logroll localizes to the hip region.  Lateral hip is nontender.  There is pain with deep inside the hip in the groin with FADIR testing at 10 degrees.  Stinchfield sign is positive.  No sciatic tension signs.  Specialty Comments:  No specialty comments available.  Imaging: No results found.   PMFS History: Patient Active Problem List   Diagnosis Date Noted   PVD (peripheral vascular disease) with claudication ([place]) 01/07/2020   Neuropathy 04/09/2017   Depression 11/09/2015   Generalized anxiety disorder 11/09/2015   Tobacco dependence 11/09/2015   Chronic pain of right hip 07/04/2015   Acute right hip pain 05/30/2015   Sciatica neuralgia 05/30/2015   Hyperlipidemia LDL goal <100 04/02/2015  Fatty liver 04/02/2015   Alcoholism in remission ([place]) 04/02/2015   Prediabetes 28/20/8138   Chronic systolic heart failure ([place]) 04/02/2015   Hip pain, chronic 04/02/2015   Elevated lipase 04/02/2015   Past Medical History:  Diagnosis Date   Alcoholism in recovery [facility])    happened 4 years ago/ went thru detox in the hospital   Allergy    seasonal   Anxiety    Blockage of coronary artery of heart (HCC)    1 stent put in/ in 2015   CHF (congestive heart failure) ([place])    Depression    Hyperlipidemia    Prediabetes     Family History  Problem Relation Age of Onset   Bladder Cancer Mother    Crohn's disease Mother    Hypertension Father    Irritable bowel syndrome Sister     Past Surgical History:  Procedure Laterality Date   CORONARY ANGIOPLASTY WITH STENT PLACEMENT   2015   one stent   OTHER SURGICAL HISTORY     tooth extracted     Social History   Occupational History   Not on file  Tobacco Use   Smoking status: Every Day    Packs/day: 0.50    Types: Cigarettes   Smokeless tobacco: Never  Vaping Use   Vaping Use: Never used  Substance and Sexual Activity   Alcohol use: No   Drug use: No   Sexual activity: Not on file

## 2021-07-07 ENCOUNTER — Other Ambulatory Visit: Payer: Self-pay | Admitting: Nurse Practitioner

## 2021-07-07 DIAGNOSIS — F32A Depression, unspecified: Secondary | ICD-10-CM

## 2021-07-07 DIAGNOSIS — F411 Generalized anxiety disorder: Secondary | ICD-10-CM

## 2021-07-23 ENCOUNTER — Other Ambulatory Visit: Payer: Self-pay | Admitting: Orthopaedic Surgery

## 2021-08-01 ENCOUNTER — Other Ambulatory Visit: Payer: Self-pay | Admitting: Nurse Practitioner

## 2021-08-28 ENCOUNTER — Encounter: Payer: Self-pay | Admitting: Nurse Practitioner

## 2021-08-28 ENCOUNTER — Other Ambulatory Visit: Payer: Self-pay

## 2021-08-28 ENCOUNTER — Ambulatory Visit (INDEPENDENT_AMBULATORY_CARE_PROVIDER_SITE_OTHER): Payer: 59 | Admitting: Nurse Practitioner

## 2021-08-28 VITALS — BP 146/92 | HR 66 | Temp 98.0°F | Ht 68.0 in | Wt 219.4 lb

## 2021-08-28 DIAGNOSIS — F411 Generalized anxiety disorder: Secondary | ICD-10-CM

## 2021-08-28 DIAGNOSIS — M25551 Pain in right hip: Secondary | ICD-10-CM

## 2021-08-28 DIAGNOSIS — G629 Polyneuropathy, unspecified: Secondary | ICD-10-CM

## 2021-08-28 DIAGNOSIS — G8929 Other chronic pain: Secondary | ICD-10-CM

## 2021-08-28 DIAGNOSIS — R7303 Prediabetes: Secondary | ICD-10-CM

## 2021-08-28 DIAGNOSIS — E785 Hyperlipidemia, unspecified: Secondary | ICD-10-CM
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
## 2021-08-30 ENCOUNTER — Encounter: Payer: Self-pay | Admitting: Nurse Practitioner

## 2021-08-30 LAB — LIPID PANEL
Chol/HDL Ratio: 4.6 ratio (ref 0.0–5.0)
Cholesterol, Total: 158 mg/dL (ref 100–199)
HDL: 34 mg/dL — ABNORMAL LOW (ref >39)
LDL Chol Calc (NIH): 87 mg/dL (ref 0–99)
Triglycerides: 220 mg/dL — ABNORMAL HIGH (ref 0–149)
VLDL Cholesterol Cal: 37 mg/dL (ref 5–40)

## 2021-08-30 LAB — COMP. METABOLIC PANEL (12)
AST: 31 IU/L (ref 0–40)
Albumin/Globulin Ratio: 2 (ref 1.2–2.2)
Albumin: 5.1 g/dL — ABNORMAL HIGH (ref 3.8–4.9)
Alkaline Phosphatase: 64 IU/L (ref 44–121)
BUN/Creatinine Ratio: 17 (ref 9–20)
BUN: 18 mg/dL (ref 6–24)
Bilirubin Total: 0.4 mg/dL (ref 0.0–1.2)
Calcium: 10.4 mg/dL — ABNORMAL HIGH (ref 8.7–10.2)
Chloride: 101 mmol/L (ref 96–106)
Creatinine, Ser: 1.09 mg/dL (ref 0.76–1.27)
Globulin, Total: 2.5 g/dL (ref 1.5–4.5)
Glucose: 84 mg/dL (ref 70–99)
Potassium: 4.7 mmol/L (ref 3.5–5.2)
Sodium: 139 mmol/L (ref 134–144)
Total Protein: 7.6 g/dL (ref 6.0–8.5)
eGFR: 79 mL/min/{1.73_m2} (ref >59)

## 2021-10-01 ENCOUNTER — Other Ambulatory Visit: Payer: Self-pay | Admitting: Physician Assistant

## 2021-12-02 ENCOUNTER — Ambulatory Visit
Admission: EM | Admit: 2021-12-02 | Discharge: 2021-12-02 | Disposition: A | Discharge status: Home / Self Care | Payer: BC Managed Care – PPO | Attending: Emergency Medicine | Admitting: Emergency Medicine

## 2021-12-02 DIAGNOSIS — M62830 Muscle spasm of back: Secondary | ICD-10-CM | POA: Diagnosis not present

## 2021-12-02 DIAGNOSIS — M5442 Lumbago with sciatica, left side: Secondary | ICD-10-CM

## 2021-12-02 DIAGNOSIS — G8929 Other chronic pain: Secondary | ICD-10-CM

## 2021-12-02 MED ORDER — BACLOFEN 10 MG PO TABS: 10.0000 mg | ORAL_TABLET | Freq: Three times a day (TID) | ORAL | 0 refills | Status: AC

## 2021-12-02 MED ORDER — DICLOFENAC SODIUM 1 % EX GEL
4.0000 g | Freq: Four times a day (QID) | CUTANEOUS | 2 refills | Status: AC
Start: 2021-12-02 — End: ?

## 2021-12-02 MED ORDER — METHYLPREDNISOLONE 8 MG PO TABS: ORAL_TABLET | ORAL | 0 refills | Status: AC

## 2021-12-02 MED ORDER — ACETAMINOPHEN 500 MG PO TABS: 1000.0000 mg | ORAL_TABLET | Freq: Three times a day (TID) | ORAL | 0 refills | Status: AC

## 2021-12-02 MED ORDER — TRIAMCINOLONE ACETONIDE 40 MG/ML IJ SUSP
80.0000 mg | Freq: Once | INTRAMUSCULAR | Status: AC
  Administered 2021-12-02: 80 mg via INTRAMUSCULAR

## 2021-12-02 NOTE — Discharge Instructions (Addendum)
The mainstay of therapy for musculoskeletal pain is reduction of inflammation and relaxation of tension which is causing inflammation.  Keep in mind, pain always begets more pain.  To help you stay ahead of your pain and inflammation, I have provided the following regimen for you:   During your visit today, you received an injection of Kenalog, high-dose steroidal anti-inflammatory medication that should significantly reduce your pain for the next 6 to 8 hours.    Please begin taking Tylenol 1000 mg 3 times daily (every 8 hours) as soon as you pick up your prescriptions from the pharmacy.   This evening, you can begin taking baclofen 10 mg.  This is a highly effective muscle relaxer and antispasmodic which should continue to provide you with relaxation of your tense muscles, allow you to sleep well and to keep your pain under control.  You can continue taking this medication 3 times daily as you need to.  If you find that this medication makes you too sleepy, you can break them in half for your daytime doses and, if needed double them for your nighttime dose.  Do not take more than 30 mg of baclofen in a 24-hour period.   Tomorrow morning, please begin taking methylprednisolone.  Please take all tablets of the daily recommended dose with your breakfast meal.  If you have had significant relation of your pain before you finish the entire prescription, please feel free to discontinue.  It is not important to finish every dose.   During the day, please set aside time to apply ice to the affected area 4 times daily for 20 minutes each application.  This can be achieved by using a bag of frozen peas or corn, a Ziploc bag filled with ice and water, or Ziploc bag filled with half rubbing alcohol and half Dawn dish detergent, frozen into a slush.  Please be careful not to apply ice directly to your skin, always place a soft cloth between you and the ice pack.   You are welcome to use topical anti-inflammatory  creams such as Voltaren gel, capsaicin or Aspercreme as recommended.  These medications are available over-the-counter, please follow manufactures instructions for use.  As a courtesy, I provided you with a prescription for diclofenac in the event that your insurance will pay for this.   Please consider discussing referral to physical therapy with your primary care provider.  Physical therapist are very good at teasing out the underlying cause of acute lower back pain and helping with prevention of future recurrences.   Please avoid attempts to stretch or strengthen the affected area until you are feeling completely pain-free.  Attempts to do so will only prolong the healing process.   I also recommend that you remain out of work for the next several days, I provided you with a note to return to work in 3 days.  If you feel that you need this time extended, please follow-up with your primary care provider or return to urgent care for reevaluation so that we can provide you with a note for another 3 days.   Thank you for visiting urgent care today.  We appreciate the opportunity to participate in your care.

## 2021-12-02 NOTE — ED Triage Notes (Signed)
Pt c/o muscle spasms in his lower back that radiates down his left leg.  Home interventions: Tylenol & Advil  Started: Saturday

## 2021-12-02 NOTE — ED Provider Notes (Addendum)
[facility]    CSN: [id number] Arrival date & time: 12/02/21  1510      History   Chief Complaint Chief Complaint  Patient presents with   Back Pain   Leg Pain    HPI Alex Charles is a 56 y.o. male.   Patient reports a history of recurrent lower back pain, states Alex Charles knows Alex Charles has arthritis in his back and hips.  Patient states at this time, Alex Charles believes that Alex Charles moved in a way that caused spasm in the muscles of his left lower back has pain radiating down his leg that makes him concerned that his sciatic nerve is inflamed.  Patient states Alex Charles has tried alternating doses of Tylenol and Advil every 2 hours with minimal relief and presents to urgent care today in search of a better plan.  The history is provided by the patient.    Past Medical History:  Diagnosis Date   Alcoholism in recovery [facility])    happened 4 years ago/ went thru detox in the hospital   Allergy    seasonal   Anxiety    Blockage of coronary artery of heart ([place])    1 stent put in/ in 2015   CHF (congestive heart failure) ([place])    Depression    Hyperlipidemia    Prediabetes     Patient Active Problem List   Diagnosis Date Noted   PVD (peripheral vascular disease) with claudication ([place]) 01/07/2020   Neuropathy 04/09/2017   Depression 11/09/2015   Generalized anxiety disorder 11/09/2015   Tobacco dependence 11/09/2015   Chronic pain of right hip 07/04/2015   Acute right hip pain 05/30/2015   Sciatica neuralgia 05/30/2015   Hyperlipidemia 04/02/2015   Fatty liver 04/02/2015   Alcoholism in remission ([place]) 04/02/2015   Prediabetes 35/45/6256   Chronic systolic heart failure ([place]) 04/02/2015   Hip pain, chronic 04/02/2015   Elevated lipase 04/02/2015    Past Surgical History:  Procedure Laterality Date   CORONARY ANGIOPLASTY WITH STENT PLACEMENT  2015   one stent   OTHER SURGICAL HISTORY     tooth extracted         Home Medications    Prior to Admission medications    Medication Sig Start Date End Date Taking? Authorizing Provider  acetaminophen (TYLENOL) 500 MG tablet Take 500 mg by mouth every 6 (six) hours as needed. Reported on 12/10/2015    [provider]  aspirin EC 81 MG tablet Take 1 tablet (81 mg total) by mouth daily. 09/04/16   Dorena Dew, FNP  celecoxib (CELEBREX) 200 MG capsule TAKE 1 CAPSULE BY MOUTH TWICE A DAY 07/23/21   Aundra Dubin, PA-C  fenofibrate 160 MG tablet TAKE 1 TABLET BY MOUTH EVERY DAY 08/02/21   Vevelyn Francois, NP  hydrOXYzine (ATARAX/VISTARIL) 10 MG tablet TAKE 1 TABLET BY MOUTH THREE TIMES A DAY AS NEEDED 03/15/21   Vevelyn Francois, NP  ibuprofen (ADVIL,MOTRIN) 200 MG tablet Take 200 mg by mouth every 6 (six) hours as needed.    [provider]  rosuvastatin (CRESTOR) 20 MG tablet Take 1 tablet (20 mg total) by mouth every evening. 02/20/21 02/20/22  Vevelyn Francois, NP  traZODone (DESYREL) 50 MG tablet TAKE 1 TABLET (50 MG TOTAL) BY MOUTH AT BEDTIME AS NEEDED FOR SLEEP. 03/15/21   Vevelyn Francois, NP    Family History Family History  Problem Relation Age of Onset   Bladder Cancer Mother    Crohn's disease Mother  Hypertension Father    Irritable bowel syndrome Sister     Social History Social History   Tobacco Use   Smoking status: Every Day    Packs/day: 0.50    Types: Cigarettes   Smokeless tobacco: Never  Vaping Use   Vaping Use: Never used  Substance Use Topics   Alcohol use: No   Drug use: No     Allergies   Patient has no known allergies.   Review of Systems Review of Systems   Physical Exam Triage Vital Signs ED Triage Vitals  Enc Vitals Group     BP 12/02/21 1534 133/86     Pulse Rate 12/02/21 1534 75     Resp 12/02/21 1534 16     Temp 12/02/21 1534 (!) 97.3 F (36.3 C)     Temp Source 12/02/21 1534 Oral     SpO2 12/02/21 1534 94 %     Weight --      Height --      Head Circumference --      Peak Flow --      Pain Score 12/02/21 1536 7     Pain Loc --       Pain Edu? --      Excl. in [place]? --    No data found.  Updated Vital Signs BP 133/86 (BP Location: Right Arm)   Pulse 75   Temp 97.6 F (36.4 C) (Oral)   Resp 16   SpO2 95%   Visual Acuity Right Eye Distance:   Left Eye Distance:   Bilateral Distance:    Right Eye Near:   Left Eye Near:    Bilateral Near:     Physical Exam Vitals and nursing note reviewed.  Constitutional:      General: Alex Charles is not in acute distress.    Appearance: Normal appearance. Alex Charles is not ill-appearing.  HENT:     Head: Normocephalic and atraumatic.  Eyes:     General: Lids are normal.        Right eye: No discharge.        Left eye: No discharge.     Extraocular Movements: Extraocular movements intact.     Conjunctiva/sclera: Conjunctivae normal.     Right eye: Right conjunctiva is not injected.     Left eye: Left conjunctiva is not injected.  Neck:     Trachea: Trachea and phonation normal.  Cardiovascular:     Rate and Rhythm: Normal rate and regular rhythm.     Pulses: Normal pulses.     Heart sounds: Normal heart sounds. No murmur heard.    No friction rub. No gallop.  Pulmonary:     Effort: Pulmonary effort is normal. No accessory muscle usage, prolonged expiration or respiratory distress.     Breath sounds: Normal breath sounds. No stridor, decreased air movement or transmitted upper airway sounds. No decreased breath sounds, wheezing, rhonchi or rales.  Chest:     Chest wall: No tenderness.  Musculoskeletal:     Cervical back: Normal range of motion and neck supple. Normal range of motion.     Thoracic back: Normal.     Lumbar back: Spasms and tenderness present. No swelling, edema, deformity, signs of trauma, lacerations or bony tenderness. Decreased range of motion. Positive right straight leg raise test and positive left straight leg raise test. No scoliosis.  Lymphadenopathy:     Cervical: No cervical adenopathy.  Skin:    General: Skin is warm and dry.  Findings: No  erythema or rash.  Neurological:     General: No focal deficit present.     Mental Status: Alex Charles is alert and oriented to person, place, and time.  Psychiatric:        Mood and Affect: Mood normal.        Behavior: Behavior normal.      UC Treatments / Results  Labs (all labs ordered are listed, but only abnormal results are displayed) Labs Reviewed - No data to display  EKG   Radiology No results found.  Procedures Procedures (including critical care time)  Medications Ordered in UC Medications  triamcinolone acetonide (KENALOG-40) injection 80 mg (has no administration in time range)    Initial Impression / Assessment and Plan / UC Course  I have reviewed the triage vital signs and the nursing notes.  Pertinent labs & imaging results that were available during my care of the patient were reviewed by me and considered in my medical decision making (see chart for details).    Please see discharge instructions for details of plan of care.  Return precautions advised.  Final Clinical Impressions(s) / UC Diagnoses   Final diagnoses:  Chronic left-sided low back pain with left-sided sciatica  Spasm of lumbar paraspinous muscle     Discharge Instructions      The mainstay of therapy for musculoskeletal pain is reduction of inflammation and relaxation of tension which is causing inflammation.  Keep in mind, pain always begets more pain.  To help you stay ahead of your pain and inflammation, I have provided the following regimen for you:   During your visit today, you received an injection of Kenalog, high-dose steroidal anti-inflammatory medication that should significantly reduce your pain for the next 6 to 8 hours.    Please begin taking Tylenol 1000 mg 3 times daily (every 8 hours) as soon as you pick up your prescriptions from the pharmacy.   This evening, you can begin taking baclofen 10 mg.  This is a highly effective muscle relaxer and antispasmodic which should  continue to provide you with relaxation of your tense muscles, allow you to sleep well and to keep your pain under control.  You can continue taking this medication 3 times daily as you need to.  If you find that this medication makes you too sleepy, you can break them in half for your daytime doses and, if needed double them for your nighttime dose.  Do not take more than 30 mg of baclofen in a 24-hour period.   Tomorrow morning, please begin taking methylprednisolone.  Please take all tablets of the daily recommended dose with your breakfast meal.  If you have had significant relation of your pain before you finish the entire prescription, please feel free to discontinue.  It is not important to finish every dose.   During the day, please set aside time to apply ice to the affected area 4 times daily for 20 minutes each application.  This can be achieved by using a bag of frozen peas or corn, a Ziploc bag filled with ice and water, or Ziploc bag filled with half rubbing alcohol and half Dawn dish detergent, frozen into a slush.  Please be careful not to apply ice directly to your skin, always place a soft cloth between you and the ice pack.   You are welcome to use topical anti-inflammatory creams such as Voltaren gel, capsaicin or Aspercreme as recommended.  These medications are available over-the-counter, please follow manufactures  instructions for use.  As a courtesy, I provided you with a prescription for diclofenac in the event that your insurance will pay for this.   Please consider discussing referral to physical therapy with your primary care provider.  Physical therapist are very good at teasing out the underlying cause of acute lower back pain and helping with prevention of future recurrences.   Please avoid attempts to stretch or strengthen the affected area until you are feeling completely pain-free.  Attempts to do so will only prolong the healing process.   I also recommend that you  remain out of work for the next several days, I provided you with a note to return to work in 3 days.  If you feel that you need this time extended, please follow-up with your primary care provider or return to urgent care for reevaluation so that we can provide you with a note for another 3 days.   Thank you for visiting urgent care today.  We appreciate the opportunity to participate in your care.      ED Prescriptions     Medication Sig Dispense Auth. Provider   baclofen (LIORESAL) 10 MG tablet Take 1 tablet (10 mg total) by mouth 3 (three) times daily for 7 days. 21 tablet Lynden Oxford Scales, PA-C   acetaminophen (TYLENOL) 500 MG tablet Take 2 tablets (1,000 mg total) by mouth every 8 (eight) hours. 180 tablet Lynden Oxford Scales, PA-C   diclofenac Sodium (VOLTAREN) 1 % GEL Apply 4 g topically 4 (four) times daily. Apply to affected areas 4 times daily as needed for pain. 100 g Lynden Oxford Scales, PA-C   methylPREDNISolone (MEDROL) 8 MG tablet Take 4 tablets (32 mg total) by mouth daily for 2 days, THEN 3 tablets (24 mg total) daily for 2 days, THEN 2 tablets (16 mg total) daily for 2 days, THEN 1 tablet (8 mg total) daily for 2 days. 20 tablet Lynden Oxford Scales, PA-C      PDMP not reviewed this encounter.   Lynden Oxford Scales, PA-C 12/02/21 1628    Lynden Oxford Scales, PA-C 12/02/21 [phone]

## 2021-12-04 ENCOUNTER — Encounter: Payer: Self-pay | Admitting: Nurse Practitioner

## 2021-12-04 ENCOUNTER — Ambulatory Visit (INDEPENDENT_AMBULATORY_CARE_PROVIDER_SITE_OTHER): Payer: BC Managed Care – PPO | Admitting: Nurse Practitioner

## 2021-12-04 VITALS — BP 120/83 | HR 75 | Temp 99.0°F | Ht 68.0 in | Wt 214.0 lb

## 2021-12-04 DIAGNOSIS — E785 Hyperlipidemia, unspecified: Secondary | ICD-10-CM | POA: Diagnosis not present

## 2021-12-04 DIAGNOSIS — R7303 Prediabetes: Secondary | ICD-10-CM

## 2021-12-04 DIAGNOSIS — M25562 Pain in left knee: Secondary | ICD-10-CM | POA: Diagnosis not present

## 2021-12-04 LAB — POCT GLYCOSYLATED HEMOGLOBIN (HGB A1C)
HbA1c POC (<> result, manual entry): 6.2 % (ref 4.0–5.6)
HbA1c, POC (controlled diabetic range): 6.2 % (ref 0.0–7.0)
HbA1c, POC (prediabetic range): 6.2 % (ref 5.7–6.4)
Hemoglobin A1C: 6.2 % — AB (ref 4.0–5.6)

## 2021-12-04 NOTE — Progress Notes (Signed)
[address], [place]  [address] Phone:  [phone]   Fax:  [phone] Subjective:   Patient ID: Alex Charles, male    DOB: 18-Apr-1966, 56 y.o.   MRN: [id number]  Chief Complaint  Patient presents with   Follow-up    3 month follow up; Patient states that he was seen in the urgent care for low back and hip pain and was given a Kenalog injection and that seemed to help for his back and hip. Patient states that he is still have some pains in his left left and knee area.   HPI Alex Charles 56 y.o. male  has a past medical history of Alcoholism in recovery [facility]), Allergy, Anxiety, Blockage of coronary artery of heart ([place]), CHF (congestive heart failure) ([place]), Depression, Hyperlipidemia, and Prediabetes. To the [facility] for left knee pain and 3 mth follow up.   States that he was recently treated in the urgent care for low back, left knee and hip pain. Pain has improved , but he continues to have knee pain. Was discharged with prescription for prednisone.   Denies monitoring meals and/ exercising regularly. States that he has difficulty exercising due to chronic arthritis in bilateral hips. Denies any other concerns today.   Denies any fatigue, chest pain, shortness of breath, HA or dizziness. Denies any blurred vision, numbness or tingling.  Past Medical History:  Diagnosis Date   Alcoholism in recovery [facility])    happened 4 years ago/ went thru detox in the hospital   Allergy    seasonal   Anxiety    Blockage of coronary artery of heart ([facility])    1 stent put in/ in 2015   CHF (congestive heart failure) ([place])    Depression    Hyperlipidemia    Prediabetes     Past Surgical History:  Procedure Laterality Date   CORONARY ANGIOPLASTY WITH STENT PLACEMENT  2015   one stent   OTHER SURGICAL HISTORY     tooth extracted      Family History  Problem Relation Age of Onset   Bladder Cancer Mother    Crohn's disease Mother     Hypertension Father    Irritable bowel syndrome Sister     Social History   Socioeconomic History   Marital status: Single    Spouse name: Not on file   Number of children: Not on file   Years of education: Not on file   Highest education level: Not on file  Occupational History   Not on file  Tobacco Use   Smoking status: Every Day    Packs/day: 0.50    Types: Cigarettes   Smokeless tobacco: Never  Vaping Use   Vaping Use: Never used  Substance and Sexual Activity   Alcohol use: No   Drug use: Never   Sexual activity: Not Currently  Other Topics Concern   Not on file  Social History Narrative   Not on file   Social Determinants of Health   Financial Resource Strain: Not on file  Food Insecurity: Not on file  Transportation Needs: Not on file  Physical Activity: Not on file  Stress: Not on file  Social Connections: Not on file  Intimate Partner Violence: Not on file    Outpatient Medications Prior to Visit  Medication Sig Dispense Refill   acetaminophen (TYLENOL) 500 MG tablet Take 2 tablets (1,000 mg total) by mouth every 8 (eight) hours. 180 tablet 0   aspirin  EC 81 MG tablet Take 1 tablet (81 mg total) by mouth daily. 30 tablet 11   baclofen (LIORESAL) 10 MG tablet Take 1 tablet (10 mg total) by mouth 3 (three) times daily for 7 days. 21 tablet 0   busPIRone (BUSPAR) 10 MG tablet Take 10 mg by mouth 2 (two) times daily. Take 1 tablet by mouth twice daily.     celecoxib (CELEBREX) 200 MG capsule TAKE 1 CAPSULE BY MOUTH TWICE A DAY 30 capsule 3   diclofenac Sodium (VOLTAREN) 1 % GEL Apply 4 g topically 4 (four) times daily. Apply to affected areas 4 times daily as needed for pain. 100 g 2   fenofibrate 160 MG tablet TAKE 1 TABLET BY MOUTH EVERY DAY 90 tablet 1   hydrOXYzine (ATARAX/VISTARIL) 10 MG tablet TAKE 1 TABLET BY MOUTH THREE TIMES A DAY AS NEEDED 270 tablet 2   ibuprofen (ADVIL,MOTRIN) 200 MG tablet Take 200 mg by mouth every 6 (six) hours as needed.      methylPREDNISolone (MEDROL) 8 MG tablet Take 4 tablets (32 mg total) by mouth daily for 2 days, THEN 3 tablets (24 mg total) daily for 2 days, THEN 2 tablets (16 mg total) daily for 2 days, THEN 1 tablet (8 mg total) daily for 2 days. 20 tablet 0   rosuvastatin (CRESTOR) 20 MG tablet Take 1 tablet (20 mg total) by mouth every evening. 90 tablet 3   traZODone (DESYREL) 50 MG tablet TAKE 1 TABLET (50 MG TOTAL) BY MOUTH AT BEDTIME AS NEEDED FOR SLEEP. 90 tablet 3   Facility-Administered Medications Prior to Visit  Medication Dose Route Frequency Provider Last Rate Last Admin   0.9 %  sodium chloride infusion  500 mL Intravenous Once [place], MD        No Known Allergies  Review of Systems  Constitutional:  Negative for chills, fever and malaise/fatigue.  HENT: Negative.    Respiratory:  Negative for cough and shortness of breath.   Cardiovascular:  Negative for chest pain, palpitations and leg swelling.  Gastrointestinal:  Negative for abdominal pain, blood in stool, constipation, diarrhea, nausea and vomiting.  Musculoskeletal:  Positive for back pain and joint pain.  Skin: Negative.   Neurological: Negative.   Psychiatric/Behavioral:  Negative for depression. The patient is not nervous/anxious.   All other systems reviewed and are negative.      Objective:    Physical Exam Constitutional:      General: He is not in acute distress.    Appearance: Normal appearance. He is normal weight.  HENT:     Head: Normocephalic.  Neck:     Vascular: No carotid bruit.  Cardiovascular:     Rate and Rhythm: Normal rate and regular rhythm.     Pulses: Normal pulses.     Heart sounds: Normal heart sounds.     Comments: No obvious peripheral edema Pulmonary:     Effort: Pulmonary effort is normal.     Breath sounds: Normal breath sounds.  Musculoskeletal:        General: No swelling, tenderness, deformity or signs of injury. Normal range of motion.     Cervical back: Normal range  of motion and neck supple. No rigidity or tenderness.     Right lower leg: No edema.     Left lower leg: No edema.  Lymphadenopathy:     Cervical: No cervical adenopathy.  Skin:    General: Skin is warm and dry.     Capillary Refill: Capillary  refill takes less than 2 seconds.  Neurological:     General: No focal deficit present.     Mental Status: He is alert and oriented to person, place, and time.  Psychiatric:        Mood and Affect: Mood normal.        Behavior: Behavior normal.        Thought Content: Thought content normal.        Judgment: Judgment normal.     BP 120/83   Pulse 75   Temp 99 F (37.2 C)   Ht _0  (1.727 m)   Wt 214 lb (97.1 kg)   SpO2 99%   BMI 32.54 kg/m  Wt Readings from Last 3 Encounters:  12/04/21 214 lb (97.1 kg)  08/28/21 219 lb 6 oz (99.5 kg)  05/22/21 217 lb 6 oz (98.6 kg)    Immunization History  Administered Date(s) Administered   Influenza,inj,Quad PF,6+ Mos 04/02/2015, 02/17/2018, 02/13/2019   Moderna Sars-Covid-2 Vaccination 10/17/2019, 11/15/2019, 07/02/2020   Tdap 04/08/2017   Zoster Recombinat (Shingrix) 01/20/2019    Diabetic Foot Exam - Simple   No data filed     Lab Results  Component Value Date   TSH 1.880 01/04/2020   Lab Results  Component Value Date   WBC 7.8 01/04/2020   HGB 16.6 01/04/2020   HCT 47.6 01/04/2020   MCV 92 01/04/2020   PLT 227 01/04/2020   Lab Results  Component Value Date   NA 139 08/28/2021   K 4.7 08/28/2021   CO2 22 09/22/2018   GLUCOSE 84 08/28/2021   BUN 18 08/28/2021   CREATININE 1.09 08/28/2021   BILITOT 0.4 08/28/2021   ALKPHOS 64 08/28/2021   AST 31 08/28/2021   ALT 35 09/22/2018   PROT 7.6 08/28/2021   ALBUMIN 5.1 (H) 08/28/2021   CALCIUM 10.4 (H) 08/28/2021   EGFR 79 08/28/2021   Lab Results  Component Value Date   CHOL 143 12/04/2021   CHOL 158 08/28/2021   CHOL 168 05/22/2021   Lab Results  Component Value Date   HDL 39 (L) 12/04/2021   HDL 34 (L)  08/28/2021   HDL 35 (L) 05/22/2021   Lab Results  Component Value Date   LDLCALC 81 12/04/2021   LDLCALC 87 08/28/2021   LDLCALC 102 (H) 05/22/2021   Lab Results  Component Value Date   TRIG 126 12/04/2021   TRIG 220 (H) 08/28/2021   TRIG 178 (H) 05/22/2021   Lab Results  Component Value Date   CHOLHDL 3.7 12/04/2021   CHOLHDL 4.6 08/28/2021   CHOLHDL 4.8 05/22/2021   Lab Results  Component Value Date   HGBA1C 6.2 (A) 12/04/2021   HGBA1C 6.2 12/04/2021   HGBA1C 6.2 12/04/2021   HGBA1C 6.2 12/04/2021       Assessment & Plan:   Problem List Items Addressed This Visit       Other   Hyperlipidemia   Relevant Orders   Lipid panel (Completed) Encouraged continued diet and exercise efforts  Encouraged continued compliance with medication     Prediabetes   Relevant Orders   HgB A1c (Completed)   Other Visit Diagnoses     Arthralgia of left knee    -  Primary Continue previously established treatment plan  Maintain follow up with arthralgia  Discussed non pharmacological methods for management of symptoms Informed to take OTC medications as needed    Follow up in 3 mths for reevaluation of chronic illness, sooner as needed  I am having Alex Charles maintain his ibuprofen, aspirin EC, rosuvastatin, traZODone, hydrOXYzine, celecoxib, fenofibrate, baclofen, acetaminophen, diclofenac Sodium, methylPREDNISolone, and busPIRone. We will continue to administer sodium chloride.  No orders of the defined types were placed in this encounter.    Teena Dunk, NP

## 2021-12-04 NOTE — Patient Instructions (Signed)
You were seen today in the [facility] for reevaluation of prediabetes . Labs were collected, results will be available via MyChart or, if abnormal, you will be contacted by clinic staff. You were prescribed medications, please take as directed. Please follow up in 3 mths for reevaluation of chronic illness

## 2021-12-05 LAB — LIPID PANEL
Chol/HDL Ratio: 3.7 ratio (ref 0.0–5.0)
Cholesterol, Total: 143 mg/dL (ref 100–199)
HDL: 39 mg/dL — ABNORMAL LOW (ref >39)
LDL Chol Calc (NIH): 81 mg/dL (ref 0–99)
Triglycerides: 126 mg/dL (ref 0–149)
VLDL Cholesterol Cal: 23 mg/dL (ref 5–40)

## 2021-12-13 ENCOUNTER — Ambulatory Visit (HOSPITAL_BASED_OUTPATIENT_CLINIC_OR_DEPARTMENT_OTHER)
Admission: RE | Admit: 2021-12-13 | Discharge: 2021-12-13 | Disposition: A | Discharge status: Home / Self Care | Payer: BC Managed Care – PPO | Source: Ambulatory Visit | Attending: Urgent Care | Admitting: Urgent Care

## 2021-12-13 ENCOUNTER — Ambulatory Visit
Admission: EM | Admit: 2021-12-13 | Discharge: 2021-12-13 | Disposition: A | Discharge status: Home / Self Care | Payer: BC Managed Care – PPO | Attending: Urgent Care | Admitting: Urgent Care

## 2021-12-13 DIAGNOSIS — M48061 Spinal stenosis, lumbar region without neurogenic claudication: Secondary | ICD-10-CM

## 2021-12-13 DIAGNOSIS — M5136 Other intervertebral disc degeneration, lumbar region: Secondary | ICD-10-CM | POA: Diagnosis not present

## 2021-12-13 DIAGNOSIS — M5442 Lumbago with sciatica, left side: Secondary | ICD-10-CM | POA: Diagnosis not present

## 2021-12-13 DIAGNOSIS — M47816 Spondylosis without myelopathy or radiculopathy, lumbar region: Secondary | ICD-10-CM | POA: Diagnosis not present

## 2021-12-13 DIAGNOSIS — M25562 Pain in left knee: Secondary | ICD-10-CM | POA: Insufficient documentation

## 2021-12-13 DIAGNOSIS — I2510 Atherosclerotic heart disease of native coronary artery without angina pectoris: Secondary | ICD-10-CM | POA: Diagnosis not present

## 2021-12-13 DIAGNOSIS — M545 Low back pain, unspecified: Secondary | ICD-10-CM | POA: Diagnosis not present

## 2021-12-13 MED ORDER — BACLOFEN 20 MG PO TABS: 20.0000 mg | ORAL_TABLET | Freq: Three times a day (TID) | ORAL | 5 refills | Status: DC

## 2021-12-15 ENCOUNTER — Other Ambulatory Visit: Payer: Self-pay | Admitting: Nurse Practitioner

## 2021-12-15 DIAGNOSIS — F419 Anxiety disorder, unspecified: Secondary | ICD-10-CM

## 2021-12-17 ENCOUNTER — Telehealth: Payer: Self-pay | Admitting: Nurse Practitioner

## 2021-12-17 ENCOUNTER — Other Ambulatory Visit: Payer: Self-pay | Admitting: Nurse Practitioner

## 2021-12-17 DIAGNOSIS — G8929 Other chronic pain: Secondary | ICD-10-CM

## 2021-12-17 NOTE — Telephone Encounter (Signed)
Patient requesting referral to spine clinic as per discussion during visit on 12/04/21

## 2022-01-01 ENCOUNTER — Encounter: Payer: Self-pay | Admitting: Orthopaedic Surgery

## 2022-01-01 ENCOUNTER — Ambulatory Visit (INDEPENDENT_AMBULATORY_CARE_PROVIDER_SITE_OTHER): Payer: BC Managed Care – PPO | Admitting: Orthopaedic Surgery

## 2022-01-01 DIAGNOSIS — G8929 Other chronic pain: Secondary | ICD-10-CM | POA: Diagnosis not present

## 2022-01-01 DIAGNOSIS — M5442 Lumbago with sciatica, left side: Secondary | ICD-10-CM | POA: Diagnosis not present

## 2022-01-01 MED ORDER — DICLOFENAC SODIUM 75 MG PO TBEC: 75.0000 mg | DELAYED_RELEASE_TABLET | Freq: Two times a day (BID) | ORAL | 2 refills | Status: DC | PRN

## 2022-01-01 NOTE — Progress Notes (Unsigned)
Office Visit Note   Patient: Alex Charles           Date of Birth: [birth year]           MRN: [id number] Visit Date: 01/01/2022              Requested by: Bo Merino I, NP No address on file PCP: Teena Dunk, NP   Assessment & Plan: Visit Diagnoses:  1. Chronic left-sided low back pain with left-sided sciatica     Plan: Impression is chronic left-sided low back pain with left lower extremity radiculopathy.  Although the patient does have slight pain in the groin with logroll, I believe majority of his symptoms today are coming from his back.  We have discussed starting him on a home exercise program versus physical therapy.  He would like to try the home exercise program for now as he will also be starting a program of target.  We have also discussed starting him on an NSAID which she is agreeable to.  He will follow-up with [place] if his symptoms do not improve or if they worsen.  Call with concerns or questions in the meantime.  Follow-Up Instructions: Return if symptoms worsen or fail to improve.   Orders:  No orders of the defined types were placed in this encounter.  No orders of the defined types were placed in this encounter.     Procedures: No procedures performed   Clinical Data: No additional findings.   Subjective: Chief Complaint  Patient presents with   Left Hip - Pain    HPI patient is a pleasant 56 year old gentleman who comes in today with chronic left-sided low back pain which radiates to his buttock and wraps around to the anterior thigh and stops at the knee.  He has been dealing with this for the past month.  He denies any injury or change in activity.  The pain appears to worsen with turning certain ways.  He has associated leg spasms.  This recent episode of pain was improved with a steroid and muscle relaxer provided by urgent care physician for which she recently saw.  Of note, he has a history of chronic low back pain as well as early  arthritis of the left hip.  Review of Systems as detailed in HPI.  All others June are negative.   Objective: Vital Signs: There were no vitals taken for this visit.  Physical Exam well-developed well-nourished gentleman in no acute distress.  Alert and oriented x3.  Ortho Exam left hip exam reveals slight pain with logroll.  He does have a moderately positive straight leg raise.  No pain with lumbar flexion, extension or rotation.  No spinous or paraspinous tenderness.  No focal weakness.  He is neurovascular tact distally.  Specialty Comments:  No specialty comments available.  Imaging: X-rays of the lumbar spine reviewed by me in canopy reveal moderate degenerative changes worse at L1-2, L2-3 and L4-5.   PMFS History: Patient Active Problem List   Diagnosis Date Noted   PVD (peripheral vascular disease) with claudication ([place]) 01/07/2020   Neuropathy 04/09/2017   Depression 11/09/2015   Generalized anxiety disorder 11/09/2015   Tobacco dependence 11/09/2015   Chronic pain of right hip 07/04/2015   Acute right hip pain 05/30/2015   Sciatica neuralgia 05/30/2015   Hyperlipidemia 04/02/2015   Fatty liver 04/02/2015   Alcoholism in remission ([place]) 04/02/2015   Prediabetes 99/24/2683   Chronic systolic heart failure ([place]) 04/02/2015   Hip pain,  chronic 04/02/2015   Elevated lipase 04/02/2015   Past Medical History:  Diagnosis Date   Alcoholism in recovery [facility])    happened 4 years ago/ went thru detox in the hospital   Allergy    seasonal   Anxiety    Blockage of coronary artery of heart (HCC)    1 stent put in/ in 2015   CHF (congestive heart failure) ([place])    Depression    Hyperlipidemia    Prediabetes     Family History  Problem Relation Age of Onset   Bladder Cancer Mother    Crohn's disease Mother    Hypertension Father    Irritable bowel syndrome Sister     Past Surgical History:  Procedure Laterality Date   CORONARY ANGIOPLASTY WITH STENT PLACEMENT   2015   one stent   OTHER SURGICAL HISTORY     tooth extracted     Social History   Occupational History   Not on file  Tobacco Use   Smoking status: Every Day    Packs/day: 0.50    Types: Cigarettes   Smokeless tobacco: Never  Vaping Use   Vaping Use: Never used  Substance and Sexual Activity   Alcohol use: No   Drug use: Never   Sexual activity: Not Currently

## 2022-01-08 ENCOUNTER — Other Ambulatory Visit: Payer: Self-pay | Admitting: Nurse Practitioner

## 2022-01-08 DIAGNOSIS — F411 Generalized anxiety disorder: Secondary | ICD-10-CM

## 2022-01-08 DIAGNOSIS — F32A Depression, unspecified: Secondary | ICD-10-CM

## 2022-02-01 ENCOUNTER — Other Ambulatory Visit: Payer: Self-pay | Admitting: Nurse Practitioner

## 2022-02-19 ENCOUNTER — Other Ambulatory Visit: Payer: Self-pay | Admitting: Nurse Practitioner

## 2022-03-06 ENCOUNTER — Ambulatory Visit (INDEPENDENT_AMBULATORY_CARE_PROVIDER_SITE_OTHER): Payer: BC Managed Care – PPO | Admitting: Nurse Practitioner

## 2022-03-06 ENCOUNTER — Other Ambulatory Visit: Payer: Self-pay | Admitting: Nurse Practitioner

## 2022-03-06 ENCOUNTER — Encounter: Payer: Self-pay | Admitting: Nurse Practitioner

## 2022-03-06 VITALS — BP 130/91 | HR 81 | Temp 98.6°F | Ht 68.0 in | Wt 215.8 lb

## 2022-03-06 DIAGNOSIS — F419 Anxiety disorder, unspecified: Secondary | ICD-10-CM | POA: Diagnosis not present

## 2022-03-06 DIAGNOSIS — G4700 Insomnia, unspecified: Secondary | ICD-10-CM

## 2022-03-06 DIAGNOSIS — F32A Depression, unspecified: Secondary | ICD-10-CM | POA: Diagnosis not present

## 2022-03-06 MED ORDER — BUSPIRONE HCL 30 MG PO TABS: 30.0000 mg | ORAL_TABLET | Freq: Two times a day (BID) | ORAL | 0 refills | Status: DC

## 2022-03-06 NOTE — Progress Notes (Signed)
$'@Patient'C$  ID: Alex Charles, male    DOB: 30-Mar-1966, 56 y.o.   MRN: [id number]  Chief Complaint  Patient presents with   Follow-up    Pt is here for  month follow up visit. Pt states he has been anxious and depressed pt has been feeling like this for a month     Referring provider: No ref. provider found   HPI  Alex Charles presents for follow up. He  has a past medical history of Alcoholism in recovery [facility]), Allergy, Anxiety, Blockage of coronary artery of heart ([place]), CHF (congestive heart failure) ([place]), Depression, Hyperlipidemia, and Prediabetes.    Patient states that he is compliant with medications. He is still employed at [employer]. He has felt more anxious and depressed over the past month. He thinks this might be due to some degree of social isolation. Denies f/c/s, n/v/d, hemoptysis, PND, leg swelling Denies chest pain or edema     No Known Allergies  Immunization History  Administered Date(s) Administered   Influenza,inj,Quad PF,6+ Mos 04/02/2015, 02/17/2018, 02/13/2019   Moderna Sars-Covid-2 Vaccination 10/17/2019, 11/15/2019, 07/02/2020   Tdap 04/08/2017   Zoster Recombinat (Shingrix) 01/20/2019    Past Medical History:  Diagnosis Date   Alcoholism in recovery ([place])    happened 4 years ago/ went thru detox in the hospital   Allergy    seasonal   Anxiety    Blockage of coronary artery of heart ([place])    1 stent put in/ in 2015   CHF (congestive heart failure) ([place])    Depression    Hyperlipidemia    Prediabetes     Tobacco History: Social History   Tobacco Use  Smoking Status Every Day   Packs/day: 0.50   Types: Cigarettes  Smokeless Tobacco Never   Ready to quit: Not Answered Counseling given: Not Answered   Outpatient Encounter Medications as of 03/06/2022  Medication Sig   aspirin EC 81 MG tablet Take 1 tablet (81 mg total) by mouth daily.   baclofen (LIORESAL) 20 MG tablet Take 1 tablet (20 mg total) by mouth 3 (three) times daily.    busPIRone (BUSPAR) 30 MG tablet Take 1 tablet (30 mg total) by mouth in the morning and at bedtime.   diclofenac (VOLTAREN) 75 MG EC tablet Take 1 tablet (75 mg total) by mouth 2 (two) times daily as needed.   diclofenac Sodium (VOLTAREN) 1 % GEL Apply 4 g topically 4 (four) times daily. Apply to affected areas 4 times daily as needed for pain.   fenofibrate 160 MG tablet TAKE 1 TABLET BY MOUTH EVERY DAY   hydrOXYzine (ATARAX) 10 MG tablet TAKE 1 TABLET BY MOUTH THREE TIMES A DAY AS NEEDED   rosuvastatin (CRESTOR) 20 MG tablet TAKE 1 TABLET BY MOUTH EVERY DAY IN THE EVENING   traZODone (DESYREL) 50 MG tablet TAKE 1 TABLET (50 MG TOTAL) BY MOUTH AT BEDTIME AS NEEDED FOR SLEEP.   [DISCONTINUED] busPIRone (BUSPAR) 15 MG tablet TAKE 1 TABLET BY MOUTH TWICE A DAY   celecoxib (CELEBREX) 200 MG capsule TAKE 1 CAPSULE BY MOUTH TWICE A DAY (Patient not taking: Reported on 03/06/2022)   ibuprofen (ADVIL,MOTRIN) 200 MG tablet Take 200 mg by mouth every 6 (six) hours as needed. (Patient not taking: Reported on 03/06/2022)   [DISCONTINUED] busPIRone (BUSPAR) 10 MG tablet Take 10 mg by mouth 2 (two) times daily. Take 1 tablet by mouth twice daily. (Patient not taking: Reported on 03/06/2022)   Facility-Administered Encounter Medications as of 03/06/2022  Medication  0.9 %  sodium chloride infusion     Review of Systems  Review of Systems  Constitutional: Negative.   HENT: Negative.    Cardiovascular: Negative.   Gastrointestinal: Negative.   Allergic/Immunologic: Negative.   Neurological: Negative.   Psychiatric/Behavioral:  The patient is nervous/anxious.        Physical Exam  BP (!) 130/91 (BP Location: Right Arm, Patient Position: Sitting, Cuff Size: Large)   Pulse 81   Temp 98.6 F (37 C)   Ht '5\' 8"'$  (1.727 m)   Wt 215 lb 12.8 oz (97.9 kg)   SpO2 100%   BMI 32.81 kg/m   Wt Readings from Last 5 Encounters:  03/06/22 215 lb 12.8 oz (97.9 kg)  12/04/21 214 lb (97.1 kg)  08/28/21 219 lb  6 oz (99.5 kg)  05/22/21 217 lb 6 oz (98.6 kg)  02/13/21 216 lb 0.6 oz (98 kg)     Physical Exam Vitals and nursing note reviewed.  Constitutional:      General: He is not in acute distress.    Appearance: He is well-developed.  Cardiovascular:     Rate and Rhythm: Normal rate and regular rhythm.  Pulmonary:     Effort: Pulmonary effort is normal.     Breath sounds: Normal breath sounds.  Skin:    General: Skin is warm and dry.  Neurological:     Mental Status: He is alert and oriented to person, place, and time.      Lab Results:  CBC    Component Value Date/Time   WBC 7.8 01/04/2020 1404   WBC 9.4 04/08/2017 1427   RBC 5.17 01/04/2020 1404   RBC 5.58 04/08/2017 1427   HGB 16.6 01/04/2020 1404   HCT 47.6 01/04/2020 1404   PLT 227 01/04/2020 1404   MCV 92 01/04/2020 1404   MCH 32.1 01/04/2020 1404   MCH 31.7 04/08/2017 1427   MCHC 34.9 01/04/2020 1404   MCHC 34.4 04/08/2017 1427   RDW 13.8 01/04/2020 1404   LYMPHSABS 3.3 (H) 01/04/2020 1404   MONOABS 504 01/28/2016 1403   EOSABS 0.2 01/04/2020 1404   BASOSABS 0.1 01/04/2020 1404    BMET    Component Value Date/Time   NA 139 08/28/2021 0958   K 4.7 08/28/2021 0958   CL 101 08/28/2021 0958   CO2 22 09/22/2018 0907   GLUCOSE 84 08/28/2021 0958   GLUCOSE 128 (H) 08/14/2016 1310   BUN 18 08/28/2021 0958   CREATININE 1.09 08/28/2021 0958   CREATININE 0.97 08/14/2016 1310   CALCIUM 10.4 (H) 08/28/2021 0958   GFRNONAA 67 08/08/2020 1141   GFRNONAA 89 08/14/2016 1310   GFRAA 78 08/08/2020 1141   GFRAA >89 08/14/2016 1310     Assessment & Plan:   Anxiety and depression 1. Anxiety and depression  - busPIRone (BUSPAR) 30 MG tablet; Take 1 tablet (30 mg total) by mouth in the morning and at bedtime.  Dispense: 60 tablet; Refill: 0  Will ask Manuela Schwartz to reach out for counseling.    Follow up:  Follow up in 3 months or sooner     Fenton Foy, NP 03/06/2022

## 2022-03-06 NOTE — Assessment & Plan Note (Signed)
1. Anxiety and depression  - busPIRone (BUSPAR) 30 MG tablet; Take 1 tablet (30 mg total) by mouth in the morning and at bedtime.  Dispense: 60 tablet; Refill: 0  Will ask Manuela Schwartz to reach out for counseling.    Follow up:  Follow up in 3 months or sooner

## 2022-03-06 NOTE — Patient Instructions (Signed)
1. Anxiety and depression  - busPIRone (BUSPAR) 30 MG tablet; Take 1 tablet (30 mg total) by mouth in the morning and at bedtime.  Dispense: 60 tablet; Refill: 0  Will ask Manuela Schwartz to reach out for counseling.    Follow up:  Follow up in 3 months or sooner    Major Depressive Disorder, Adult Major depressive disorder is a mental health condition. This disorder affects feelings. It can also affect the body. Symptoms of this condition last most of the day, almost every day, for 2 weeks. This disorder can affect: Relationships. Daily activities, such as work and school. Activities that you normally like to do. What are the causes? The cause of this condition is not known. The disorder is likely caused by a mix of things, including: Your personality, such as being a shy person. Your behavior, or how you act toward others. Your thoughts and feelings. Too much alcohol or drugs. How you react to stress. Health and mental problems that you have had for a long time. Things that hurt you in the past (trauma). Big changes in your life, such as divorce. What increases the risk? The following factors may make you more likely to develop this condition: Having family members with depression. Being a woman. Problems in the family. Low levels of some brain chemicals. Things that caused you pain as a child, especially if you lost a parent or were abused. A lot of stress in your life, such as from: Living without basic needs of life, such as food and shelter. Being treated poorly because of race, sex, or religion (discrimination). Health and mental problems that you have had for a long time. What are the signs or symptoms? The main symptoms of this condition are: Being sad all the time. Being grouchy all the time. Loss of interest in things and activities. Other symptoms include: Sleeping too much or too little. Eating too much or too little. Gaining or losing weight, without knowing  why. Feeling tired or having low energy. Being restless and weak. Feeling hopeless, worthless, or guilty. Trouble thinking clearly or making decisions. Thoughts of hurting yourself or others, or thoughts of ending your life. Spending a lot of time alone. Inability to complete common tasks of daily life. If you have very bad MDD, you may: Believe things that are not true. Hear, see, taste, or feel things that are not there. Have mild depression that lasts for at least 2 years. Feel very sad and hopeless. Have trouble speaking or moving. How is this treated? This condition may be treated with: Talk therapy. This teaches you to know bad thoughts, feelings, and actions and how to change them. This can also help you to communicate with others. This can be done with members of your family. Medicines. These can be used to treat worry (anxiety), depression, or low levels of chemicals in the brain. Lifestyle changes. You may need to: Limit alcohol use. Limit drug use. Get regular exercise. Get plenty of sleep. Make healthy eating choices. Spend more time outdoors. Brain stimulation. This treatment excites the brain. This is done when symptoms are very bad or have not gotten better with other treatments. Follow these instructions at home: Activity Get regular exercise as told. Spend time outdoors as told. Make time to do the things you enjoy. Find ways to deal with stress. Try to: Meditate. Do deep breathing. Spend time in nature. Keep a journal. Return to your normal activities as told by your doctor. Ask your doctor  what activities are safe for you. Alcohol and drug use If you drink alcohol: Limit how much you use to: 0-1 drink a day for women. 0-2 drinks a day for men. Be aware of how much alcohol is in your drink. In the [place]., one drink equals one 12 oz bottle of beer (355 mL), one 5 oz glass of wine (148 mL), or one 1 oz glass of hard liquor (44 mL). Talk to your doctor  about: Alcohol use. Alcohol can affect some medicines. Any drug use. General instructions  Take over-the-counter and prescription medicines and herbal preparations only as told by your doctor. Eat a healthy diet. Get a lot of sleep. Think about joining a support group. Your doctor may be able to suggest one. Keep all follow-up visits as told by your doctor. This is important. Where to find more information: [employer] on Mental Illness: www.nami.[place]: https://carter.com/ American Psychiatric Association: www.psychiatry.org/patients-families/ Contact a doctor if: Your symptoms get worse. You get new symptoms. Get help right away if: You hurt yourself. You have serious thoughts about hurting yourself or others. You see, hear, taste, smell, or feel things that are not there. If you ever feel like you may hurt yourself or others, or have thoughts about taking your own life, get help right away. Go to your nearest emergency department or: Call your local emergency services (911 in the [place] Call a suicide crisis helpline, such as the [place] at 256 529 0049 or 988 in the [place]. This is open 24 hours a day in the [place] Text the Crisis Text Line at 731-699-5919 (in the [place].). Summary Major depressive disorder is a mental health condition. This disorder affects feelings. Symptoms of this condition last most of the day, almost every day, for 2 weeks. The symptoms of this disorder can cause problems with relationships and with daily activities. There are treatments and support for people who get this disorder. You may need more than one type of treatment. Get help right away if you have serious thoughts about hurting yourself or others. This information is not intended to replace advice given to you by your health care provider. Make sure you discuss any questions you have with your health care provider. Document Revised:  01/02/2021 Document Reviewed: 05/21/2019 Elsevier Patient Education  [place].

## 2022-03-13 ENCOUNTER — Ambulatory Visit (INDEPENDENT_AMBULATORY_CARE_PROVIDER_SITE_OTHER): Payer: BC Managed Care – PPO | Admitting: Clinical

## 2022-03-13 DIAGNOSIS — F419 Anxiety disorder, unspecified: Secondary | ICD-10-CM | POA: Diagnosis not present

## 2022-03-13 DIAGNOSIS — F3289 Other specified depressive episodes: Secondary | ICD-10-CM

## 2022-03-14 NOTE — BH Specialist Note (Addendum)
Integrated Behavioral Health Initial In-Person Visit  MRN: [id number] Name: Alex Charles  Number of [place] Clinician visits: 1- Initial Visit  Session Start time: 4801    Session End time: 1100  Total time in minutes: 55   Types of Service: Individual psychotherapy  Interpretor:No. Interpretor Name and Language: none  Subjective: Alex Charles is a 56 y.o. male accompanied by  self. Patient was referred by Lazaro Arms, NP for depression. Patient reports the following symptoms/concerns: feeling down, socially isolated, depression, recovery from alcohol abuse. Duration of problem: several years; Severity of problem: severe  Objective: Mood: Euthymic and Affect: Appropriate Risk of harm to self or others: No plan to harm self or others  Life Context: Family and Social: lives independently, has family in the area School/Work: works full time, Loss adjuster, chartered: reading Life Changes: moved to [place] about 7-8 years ago  Patient and/or Family's Strengths/Protective Factors: Social connections, Social and Patent attorney, Concrete supports in place (healthy food, safe environments, etc.), and Sense of purpose  Goals Addressed: Patient will: Reduce symptoms of: anxiety and depression Increase knowledge and/or ability of: coping skills and self-management skills  Demonstrate ability to: Increase healthy adjustment to current life circumstances  Progress towards Goals: Ongoing  Interventions: Interventions utilized: Supportive Counseling  Standardized Assessments completed: Not Needed     03/06/2022   10:03 AM 12/04/2021    9:38 AM 08/28/2021    9:07 AM 05/22/2021    8:38 AM 02/13/2021   10:20 AM  Depression screen PHQ 2/9  Decreased Interest 3 3 0 0 0  Down, Depressed, Hopeless '2 3 1 '$ 0 0  PHQ - 2 Score '5 6 1 '$ 0 0  Altered sleeping 2 3 0    Tired, decreased energy '1 3 3    '$ Change in appetite '3 3 3    '$ Feeling bad or failure about  yourself  1 3 0    Trouble concentrating '1 2 1    '$ Moving slowly or fidgety/restless 0 2 0    Suicidal thoughts 0 3 1    PHQ-9 Score '13 25 9    '$ Difficult doing work/chores  Extremely dIfficult        Supportive counseling and assessment today.   Patient and/or Family Response: Patient engaged in session.   Patient Centered Plan: Patient is on the following Treatment Plan(s):  CBT for depression and anxiety  Assessment: Patient currently experiencing depression and anxiety exacerbated by relative social isolation. While patient has social connections, these are not deeper connections. Patient is also in recovery from alcohol abuse.   Patient may benefit from CBT to explore thoughts about self and others in context of challenges with social isolation, as well as to process emotions related to this.   Plan: Follow up with behavioral health clinician on: 03/20/22 Referral(s): [place] (In Clinic)  Estanislado Emms, [facility] [phone]

## 2022-03-19 ENCOUNTER — Ambulatory Visit: Payer: Self-pay | Admitting: Nurse Practitioner

## 2022-03-20 ENCOUNTER — Ambulatory Visit (INDEPENDENT_AMBULATORY_CARE_PROVIDER_SITE_OTHER): Payer: BC Managed Care – PPO | Admitting: Clinical

## 2022-03-20 DIAGNOSIS — F3289 Other specified depressive episodes: Secondary | ICD-10-CM

## 2022-03-20 DIAGNOSIS — F419 Anxiety disorder, unspecified: Secondary | ICD-10-CM

## 2022-03-21 NOTE — BH Specialist Note (Addendum)
Integrated Behavioral Health Follow Up In-Person Visit  MRN: [id number] Name: Alex Charles  Number of [place] Clinician visits: 2- Second Visit  Session Start time: 1000   Session End time: 8099  Total time in minutes: 55   Types of Service: Individual psychotherapy  Interpretor:No. Interpretor Name and Language: none  Subjective: Balian Schaller is a 56 y.o. male accompanied by  self. Patient was referred by Lazaro Arms, NP for depression. Patient reports the following symptoms/concerns: feeling down, socially isolated, depression, recovery from alcohol abuse. Duration of problem: several years; Severity of problem: severe  Objective: Mood: Euthymic and Affect: Appropriate Risk of harm to self or others: No plan to harm self or others  Patient and/or Family's Strengths/Protective Factors: Social connections, Social and Emotional competence, Concrete supports in place (healthy food, safe environments, etc.), and Sense of purpose  Goals Addressed: Patient will: Reduce symptoms of: anxiety and depression Increase knowledge and/or ability of: coping skills and self-management skills  Demonstrate ability to: Increase healthy adjustment to current life circumstances  Progress towards Goals: Ongoing  Interventions: Interventions utilized:  CBT Cognitive Behavioral Therapy Standardized Assessments completed: Not Needed  CBT today to explore unhelpful thoughts about self and others in context of challenges at work. Patient was experiencing increased anxiety this morning before appointment due to thinking about the problems at work. Re-framed some unhelpful thoughts and came to more balanced conclusions.  Patient and/or Family Response: Patient engaged in session.   Assessment: Patient currently experiencing depression and anxiety exacerbated by relative social isolation. While patient has social connections, these are not deeper connections. Patient is  also in recovery from alcohol abuse.   Patient may benefit from CBT to explore thoughts about self and others in context of challenges with social isolation, as well as to process emotions related to this.   Plan: Follow up with behavioral health clinician on: 03/27/22  Estanislado Emms, [facility] [phone]

## 2022-03-24 ENCOUNTER — Ambulatory Visit
Admission: EM | Admit: 2022-03-24 | Discharge: 2022-03-24 | Disposition: A | Discharge status: Home / Self Care | Payer: BC Managed Care – PPO | Attending: Family Medicine | Admitting: Family Medicine

## 2022-03-24 ENCOUNTER — Ambulatory Visit (INDEPENDENT_AMBULATORY_CARE_PROVIDER_SITE_OTHER): Payer: BC Managed Care – PPO

## 2022-03-24 DIAGNOSIS — J069 Acute upper respiratory infection, unspecified: Secondary | ICD-10-CM | POA: Insufficient documentation

## 2022-03-24 DIAGNOSIS — R059 Cough, unspecified: Secondary | ICD-10-CM

## 2022-03-24 DIAGNOSIS — R0600 Dyspnea, unspecified: Secondary | ICD-10-CM | POA: Diagnosis not present

## 2022-03-24 DIAGNOSIS — R062 Wheezing: Secondary | ICD-10-CM | POA: Diagnosis not present

## 2022-03-24 DIAGNOSIS — Z20822 Contact with and (suspected) exposure to covid-19: Secondary | ICD-10-CM | POA: Diagnosis not present

## 2022-03-24 DIAGNOSIS — J441 Chronic obstructive pulmonary disease with (acute) exacerbation: Secondary | ICD-10-CM | POA: Insufficient documentation

## 2022-03-24 DIAGNOSIS — R0989 Other specified symptoms and signs involving the circulatory and respiratory systems: Secondary | ICD-10-CM | POA: Diagnosis not present

## 2022-03-24 LAB — RESP PANEL BY RT-PCR (FLU A&B, COVID) ARPGX2
Influenza A by PCR: NEGATIVE
Influenza B by PCR: NEGATIVE
SARS Coronavirus 2 by RT PCR: NEGATIVE

## 2022-03-24 MED ORDER — PREDNISONE 20 MG PO TABS: 40.0000 mg | ORAL_TABLET | Freq: Every day | ORAL | 0 refills | Status: AC

## 2022-03-24 MED ORDER — ALBUTEROL SULFATE HFA 108 (90 BASE) MCG/ACT IN AERS: 2.0000 | INHALATION_SPRAY | RESPIRATORY_TRACT | 0 refills | Status: DC | PRN

## 2022-03-24 NOTE — ED Triage Notes (Signed)
The pt c/o congestion, SOB, body aches.  Started: this weekend  Home interventions: none

## 2022-03-24 NOTE — ED Provider Notes (Signed)
[facility]    CSN: [id number] Arrival date & time: 03/24/22  1058      History   Chief Complaint Chief Complaint  Patient presents with   Generalized Body Aches   Shortness of Breath   Nasal Congestion    HPI Alex Charles is a 56 y.o. male.    Shortness of Breath  Here in shortness of and nasal congestion that began 2 days ago on September 30.  He has had some subjective fever and some myalgia.  No nausea or vomiting or diarrhea  He does have a history of coronary artery disease, had a stent placed in 2015.  He does not have any current chest pain  He does smoke tobacco  He has never had to use an inhaler before  Past Medical History:  Diagnosis Date   Alcoholism in recovery ([place])    happened 4 years ago/ went thru detox in the hospital   Allergy    seasonal   Anxiety    Blockage of coronary artery of heart ([place])    1 stent put in/ in 2015   CHF (congestive heart failure) ([place])    Depression    Hyperlipidemia    Prediabetes     Patient Active Problem List   Diagnosis Date Noted   Anxiety and depression 03/06/2022   PVD (peripheral vascular disease) with claudication ([place]) 01/07/2020   Neuropathy 04/09/2017   Depression 11/09/2015   Generalized anxiety disorder 11/09/2015   Tobacco dependence 11/09/2015   Chronic pain of right hip 07/04/2015   Acute right hip pain 05/30/2015   Sciatica neuralgia 05/30/2015   Hyperlipidemia 04/02/2015   Fatty liver 04/02/2015   Alcoholism in remission ([place]) 04/02/2015   Prediabetes 78/24/2353   Chronic systolic heart failure ([place]) 04/02/2015   Hip pain, chronic 04/02/2015   Elevated lipase 04/02/2015    Past Surgical History:  Procedure Laterality Date   CORONARY ANGIOPLASTY WITH STENT PLACEMENT  2015   one stent   OTHER SURGICAL HISTORY     tooth extracted         Home Medications    Prior to Admission medications   Medication Sig Start Date End Date Taking? Authorizing Provider  aspirin EC  81 MG tablet Take 1 tablet (81 mg total) by mouth daily. 09/04/16   Dorena Dew, FNP  busPIRone (BUSPAR) 30 MG tablet Take 1 tablet (30 mg total) by mouth in the morning and at bedtime. 03/06/22 04/05/22  Fenton Foy, NP  diclofenac (VOLTAREN) 75 MG EC tablet Take 1 tablet (75 mg total) by mouth 2 (two) times daily as needed. 01/01/22   Aundra Dubin, PA-C  diclofenac Sodium (VOLTAREN) 1 % GEL Apply 4 g topically 4 (four) times daily. Apply to affected areas 4 times daily as needed for pain. 12/02/21   Lynden Oxford Scales, PA-C  fenofibrate 160 MG tablet TAKE 1 TABLET BY MOUTH EVERY DAY 02/05/22   Fenton Foy, NP  hydrOXYzine (ATARAX) 10 MG tablet TAKE 1 TABLET BY MOUTH THREE TIMES A DAY AS NEEDED 12/17/21   [place], Jake Church I, NP  rosuvastatin (CRESTOR) 20 MG tablet TAKE 1 TABLET BY MOUTH EVERY DAY IN THE EVENING 02/19/22   Fenton Foy, NP  traZODone (DESYREL) 50 MG tablet TAKE 1 TABLET BY MOUTH AT BEDTIME AS NEEDED FOR SLEEP. 03/07/22   Fenton Foy, NP    Family History Family History  Problem Relation Age of Onset   Bladder Cancer Mother  Crohn's disease Mother    Hypertension Father    Irritable bowel syndrome Sister     Social History Social History   Tobacco Use   Smoking status: Every Day    Packs/day: 0.50    Types: Cigarettes   Smokeless tobacco: Never  Vaping Use   Vaping Use: Never used  Substance Use Topics   Alcohol use: No   Drug use: Never     Allergies   Patient has no known allergies.   Review of Systems Review of Systems  Respiratory:  Positive for shortness of breath.      Physical Exam Triage Vital Signs ED Triage Vitals  Enc Vitals Group     BP 03/24/22 1127 (!) 130/91     Pulse Rate 03/24/22 1127 94     Resp 03/24/22 1127 16     Temp 03/24/22 1127 98.8 F (37.1 C)     Temp Source 03/24/22 1127 Oral     SpO2 03/24/22 1127 91 %     Weight --      Height --      Head Circumference --      Peak Flow --      Pain  Score 03/24/22 1146 0     Pain Loc --      Pain Edu? --      Excl. in [place]? --    No data found.  Updated Vital Signs BP (!) 130/91 (BP Location: Right Arm)   Pulse 94   Temp 98.8 F (37.1 C) (Oral)   Resp 16   SpO2 91%   Visual Acuity Right Eye Distance:   Left Eye Distance:   Bilateral Distance:    Right Eye Near:   Left Eye Near:    Bilateral Near:     Physical Exam Vitals reviewed.  Constitutional:      General: He is not in acute distress.    Appearance: He is not toxic-appearing.  HENT:     Right Ear: Tympanic membrane and ear canal normal.     Left Ear: Tympanic membrane and ear canal normal.     Nose: Nose normal.     Mouth/Throat:     Mouth: Mucous membranes are moist.     Comments: No erythema but there is clear mucus draining Eyes:     Extraocular Movements: Extraocular movements intact.     Conjunctiva/sclera: Conjunctivae normal.     Pupils: Pupils are equal, round, and reactive to light.  Cardiovascular:     Rate and Rhythm: Normal rate and regular rhythm.     Heart sounds: No murmur heard. Pulmonary:     Effort: Pulmonary effort is normal. No respiratory distress.     Breath sounds: No stridor.     Comments: There are bilateral expiratory wheezes and rhonchi.  Air movement is still actually good Musculoskeletal:     Cervical back: Neck supple.  Lymphadenopathy:     Cervical: No cervical adenopathy.  Skin:    Capillary Refill: Capillary refill takes less than 2 seconds.     Coloration: Skin is not jaundiced or pale.  Neurological:     General: No focal deficit present.     Mental Status: He is alert and oriented to person, place, and time.  Psychiatric:        Behavior: Behavior normal.      UC Treatments / Results  Labs (all labs ordered are listed, but only abnormal results are displayed) Labs Reviewed  RESP PANEL BY RT-PCR (FLU  A&B, COVID) [place]    EKG   Radiology DG Chest 2 View  Result Date: 03/24/2022 CLINICAL DATA:  Cough  and dyspnea for 2 days. Wheezing and rhonchi on exam. EXAM: CHEST - 2 VIEW COMPARISON:  None Available. FINDINGS: Heart size is normal. Masslike opacity is seen in the middle mediastinum which could be due to mediastinal mass, aortic aneurysm, or congenital aortic anomaly. Both lungs are clear. Mild thoracic spine degenerative changes are noted. IMPRESSION: No active lung disease. Masslike opacity in the middle mediastinum, with differential diagnosis including mediastinal mass, aortic aneurysm, or congenital aortic anomaly. Chest CT with contrast is recommended for further evaluation. Electronically Signed   By: Marlaine Hind M.D.   On: 03/24/2022 12:18    Procedures Procedures (including critical care time)  Medications Ordered in UC Medications - No data to display  Initial Impression / Assessment and Plan / UC Course  I have reviewed the triage vital signs and the nursing notes.  Pertinent labs & imaging results that were available during my care of the patient were reviewed by me and considered in my medical decision making (see chart for details).        History does not show any infiltrate or fluid.  There is some widening of the upper mediastinum that on the report could be the aorta or aneurysm or congenital abnormality.  I cannot find that we have any old chest imaging otherwise it would show that there was some problem previously.  On review of the chart there is an echo but it does not note any problem with the aorta on that study.  That was from a few years ago also   We are going to treat for COPD exacerbation.  He is swabbed for COVID and flu.  If he is positive for flu he is a candidate for the Tamiflu and if he is positive for COVID, he is a candidate for [place], as his EGFR was 79 earlier this year.  Discussed his chest x-ray results with him and he is going to follow-up with his primary care about that issue.  I have printed the report of the chest x-ray reading so he can  have that with him.  He is not currently having any acute chest pain or anything that would indicate he needs to have a urgent CT imaging  Final Clinical Impressions(s) / UC Diagnoses   Final diagnoses:  Viral URI with cough  COPD exacerbation [facility])   Discharge Instructions   None    ED Prescriptions   None    PDMP not reviewed this encounter.   Barrett Henle, MD 03/24/22 [phone]

## 2022-03-24 NOTE — Discharge Instructions (Addendum)
Chest x-ray did not show any pneumonia or fluid.  There was some widening of the central portion of your chest, and you may end up needing CT of your chest to sort it out.  Please follow-up with your primary care office about that.  We are including a copy of the radiology reading report   You have been swabbed for COVIDand flu, and the test will result in the next 24 hours. Our staff will call you if positive. If the COVID test is positive, you should quarantine for 5 days from the start of your symptoms  Take prednisone 20 mg--2 daily for 5 days  Albuterol inhaler--do 2 puffs every 4 hours as needed for shortness of breath or wheezing

## 2022-03-27 ENCOUNTER — Ambulatory Visit (INDEPENDENT_AMBULATORY_CARE_PROVIDER_SITE_OTHER): Payer: Self-pay | Admitting: Clinical

## 2022-03-27 DIAGNOSIS — F3289 Other specified depressive episodes: Secondary | ICD-10-CM

## 2022-03-27 DIAGNOSIS — F419 Anxiety disorder, unspecified: Secondary | ICD-10-CM

## 2022-03-27 NOTE — BH Specialist Note (Addendum)
Integrated Behavioral Health Follow Up In-Person Visit  MRN: [id number] Name: Alex Charles  Number of [facility] Clinician visits: 3- Third Visit  Session Start time: 0900   Session End time: 4098  Total time in minutes: 53   Types of Service: Individual psychotherapy  Interpretor:No. Interpretor Name and Language: none  Subjective: Alex Charles is a 56 y.o. male accompanied by  self. Patient was referred by Lazaro Arms, NP for depression. Patient reports the following symptoms/concerns: feeling down, socially isolated, depression, recovery from alcohol abuse. Duration of problem: several years; Severity of problem: severe  Objective: Mood: Euthymic and Affect: Appropriate Risk of harm to self or others: No plan to harm self or others  Patient and/or Family's Strengths/Protective Factors: Social connections, Social and Emotional competence, Concrete supports in place (healthy food, safe environments, etc.), and Sense of purpose  Goals Addressed: Patient will: Reduce symptoms of: anxiety and depression Increase knowledge and/or ability of: coping skills and self-management skills  Demonstrate ability to: Increase healthy adjustment to current life circumstances  Progress towards Goals: Ongoing  Interventions: Interventions utilized:  CBT Cognitive Behavioral Therapy and Supportive Counseling Standardized Assessments completed: Not Needed  CBT today; explored patient's thoughts and beliefs about self in context of work. Discussed accountability for engaging in new social groups.  Patient and/or Family Response: Patient engaged in session.   Assessment: Patient currently experiencing depression and anxiety exacerbated by relative social isolation. While patient has social connections, these are not deeper connections. Patient is also in recovery from alcohol abuse.   Patient may benefit from CBT to explore thoughts about self and others in  context of challenges with social isolation, as well as to process emotions related to this.   Plan: Follow up with behavioral health clinician on: 04/03/22  Estanislado Emms, LCSW

## 2022-03-28 ENCOUNTER — Other Ambulatory Visit: Payer: Self-pay | Admitting: Physician Assistant

## 2022-04-01 ENCOUNTER — Other Ambulatory Visit: Payer: Self-pay | Admitting: Nurse Practitioner

## 2022-04-01 DIAGNOSIS — F32A Depression, unspecified: Secondary | ICD-10-CM

## 2022-04-03 ENCOUNTER — Encounter: Payer: Self-pay | Admitting: Clinical

## 2022-04-09 ENCOUNTER — Encounter: Payer: Self-pay | Admitting: Nurse Practitioner

## 2022-04-09 ENCOUNTER — Ambulatory Visit: Payer: Self-pay | Admitting: Nurse Practitioner

## 2022-04-09 ENCOUNTER — Ambulatory Visit (INDEPENDENT_AMBULATORY_CARE_PROVIDER_SITE_OTHER): Payer: BC Managed Care – PPO | Admitting: Nurse Practitioner

## 2022-04-09 VITALS — BP 134/97 | HR 69 | Ht 68.5 in | Wt 207.0 lb

## 2022-04-09 DIAGNOSIS — J302 Other seasonal allergic rhinitis: Secondary | ICD-10-CM | POA: Diagnosis not present

## 2022-04-09 DIAGNOSIS — R9389 Abnormal findings on diagnostic imaging of other specified body structures: Secondary | ICD-10-CM | POA: Diagnosis not present

## 2022-04-09 DIAGNOSIS — J9859 Other diseases of mediastinum, not elsewhere classified: Secondary | ICD-10-CM | POA: Diagnosis not present

## 2022-04-09 MED ORDER — DICLOFENAC SODIUM 75 MG PO TBEC: 75.0000 mg | DELAYED_RELEASE_TABLET | Freq: Two times a day (BID) | ORAL | 2 refills | Status: DC | PRN

## 2022-04-09 MED ORDER — CETIRIZINE HCL 10 MG PO TABS: 10.0000 mg | ORAL_TABLET | Freq: Every day | ORAL | 11 refills | Status: DC

## 2022-04-09 NOTE — Assessment & Plan Note (Signed)
-   CT Chest W Contrast; Future    2. Abnormal chest x-ray  - CT Chest W Contrast; Future    3. Seasonal allergies  - cetirizine (ZYRTEC) 10 MG tablet; Take 1 tablet (10 mg total) by mouth daily.  Dispense: 30 tablet; Refill: 11    Follow up:  Follow up in 3 months or sooner

## 2022-04-09 NOTE — Progress Notes (Signed)
$'@Patient'b$  ID: Alex Charles, male    DOB: 1966-06-21, 56 y.o.   MRN: [id number]  Chief Complaint  Patient presents with   Nasal Congestion    Referring provider: Teena Dunk, NP   HPI  Alex Charles presents for follow up. He  has a past medical history of Alcoholism in recovery [facility]), Allergy, Anxiety, Blockage of coronary artery of heart ([place]), CHF (congestive heart failure) ([place]), Depression, Hyperlipidemia, and Prediabetes.     Patient presents today for follow-up visit on anxiety.  Patient states that he is compliant with medications. He is still employed at [employer].  Patient states that he is doing much better.  He states that the BuSpar is helping.  He has been doing counseling with Manuela Schwartz and states that this has helped.  Denies f/c/s, n/v/d, hemoptysis, PND, leg swelling Denies chest pain or edema  Patient reports that he recently was seen at urgent care for a URI.  He did have chest x-ray which was abnormal.  He will need to have a chest CT due to the appearance of a mediastinal mass on the chest x-ray.     No Known Allergies  Immunization History  Administered Date(s) Administered   Influenza,inj,Quad PF,6+ Mos 04/02/2015, 02/17/2018, 02/13/2019   Moderna Sars-Covid-2 Vaccination 10/17/2019, 11/15/2019, 07/02/2020   Tdap 04/08/2017   Zoster Recombinat (Shingrix) 01/20/2019    Past Medical History:  Diagnosis Date   Alcoholism in recovery ([place])    happened 4 years ago/ went thru detox in the hospital   Allergy    seasonal   Anxiety    Blockage of coronary artery of heart ([place])    1 stent put in/ in 2015   CHF (congestive heart failure) ([place])    Depression    Hyperlipidemia    Prediabetes     Tobacco History: Social History   Tobacco Use  Smoking Status Every Day   Packs/day: 0.50   Types: Cigarettes  Smokeless Tobacco Never   Ready to quit: Not Answered Counseling given: Not Answered   Outpatient Encounter Medications as of 04/09/2022   Medication Sig   albuterol (VENTOLIN HFA) 108 (90 Base) MCG/ACT inhaler Inhale 2 puffs into the lungs every 4 (four) hours as needed for wheezing or shortness of breath.   aspirin EC 81 MG tablet Take 1 tablet (81 mg total) by mouth daily.   cetirizine (ZYRTEC) 10 MG tablet Take 1 tablet (10 mg total) by mouth daily.   diclofenac Sodium (VOLTAREN) 1 % GEL Apply 4 g topically 4 (four) times daily. Apply to affected areas 4 times daily as needed for pain.   fenofibrate 160 MG tablet TAKE 1 TABLET BY MOUTH EVERY DAY   hydrOXYzine (ATARAX) 10 MG tablet TAKE 1 TABLET BY MOUTH THREE TIMES A DAY AS NEEDED   rosuvastatin (CRESTOR) 20 MG tablet TAKE 1 TABLET BY MOUTH EVERY DAY IN THE EVENING   traZODone (DESYREL) 50 MG tablet TAKE 1 TABLET BY MOUTH AT BEDTIME AS NEEDED FOR SLEEP.   [DISCONTINUED] diclofenac (VOLTAREN) 75 MG EC tablet Take 1 tablet (75 mg total) by mouth 2 (two) times daily as needed.   diclofenac (VOLTAREN) 75 MG EC tablet Take 1 tablet (75 mg total) by mouth 2 (two) times daily as needed.   No facility-administered encounter medications on file as of 04/09/2022.     Review of Systems  Review of Systems  Constitutional: Negative.   HENT: Negative.    Cardiovascular: Negative.   Gastrointestinal: Negative.   Allergic/Immunologic: Negative.  Neurological: Negative.   Psychiatric/Behavioral: Negative.         Physical Exam  BP (!) 134/97   Pulse 69   Ht 5' 8.5" (1.74 m)   Wt 207 lb (93.9 kg)   SpO2 98%   BMI 31.02 kg/m   Wt Readings from Last 5 Encounters:  04/09/22 207 lb (93.9 kg)  03/06/22 215 lb 12.8 oz (97.9 kg)  12/04/21 214 lb (97.1 kg)  08/28/21 219 lb 6 oz (99.5 kg)  05/22/21 217 lb 6 oz (98.6 kg)     Physical Exam Vitals and nursing note reviewed.  Constitutional:      General: He is not in acute distress.    Appearance: He is well-developed.  Cardiovascular:     Rate and Rhythm: Normal rate and regular rhythm.  Pulmonary:     Effort:  Pulmonary effort is normal.     Breath sounds: Normal breath sounds.  Skin:    General: Skin is warm and dry.  Neurological:     Mental Status: He is alert and oriented to person, place, and time.      Assessment & Plan:   Mediastinal mass - CT Chest W Contrast; Future    2. Abnormal chest x-ray  - CT Chest W Contrast; Future    3. Seasonal allergies  - cetirizine (ZYRTEC) 10 MG tablet; Take 1 tablet (10 mg total) by mouth daily.  Dispense: 30 tablet; Refill: 11    Follow up:  Follow up in 3 months or sooner     Fenton Foy, NP 04/09/2022

## 2022-04-09 NOTE — Progress Notes (Signed)
Patient was seen for cold symptoms and a chest xray was done he has the result and is wanting to go them.

## 2022-04-09 NOTE — Patient Instructions (Addendum)
1. Mediastinal mass  - CT Chest W Contrast; Future    2. Abnormal chest x-ray  - CT Chest W Contrast; Future    3. Seasonal allergies  - cetirizine (ZYRTEC) 10 MG tablet; Take 1 tablet (10 mg total) by mouth daily.  Dispense: 30 tablet; Refill: 11    Follow up:  Follow up in 3 months or sooner

## 2022-04-16 ENCOUNTER — Ambulatory Visit (HOSPITAL_COMMUNITY)
Admission: RE | Admit: 2022-04-16 | Discharge: 2022-04-16 | Disposition: A | Discharge status: Home / Self Care | Payer: BC Managed Care – PPO | Source: Ambulatory Visit | Attending: Nurse Practitioner | Admitting: Nurse Practitioner

## 2022-04-16 ENCOUNTER — Encounter (HOSPITAL_COMMUNITY): Payer: Self-pay

## 2022-04-16 DIAGNOSIS — I700 Atherosclerosis of aorta: Secondary | ICD-10-CM | POA: Diagnosis not present

## 2022-04-16 DIAGNOSIS — Z8709 Personal history of other diseases of the respiratory system: Secondary | ICD-10-CM | POA: Diagnosis not present

## 2022-04-16 DIAGNOSIS — R9389 Abnormal findings on diagnostic imaging of other specified body structures: Secondary | ICD-10-CM | POA: Diagnosis not present

## 2022-04-16 DIAGNOSIS — Q2547 Right aortic arch: Secondary | ICD-10-CM | POA: Diagnosis not present

## 2022-04-16 DIAGNOSIS — R222 Localized swelling, mass and lump, trunk: Secondary | ICD-10-CM | POA: Diagnosis not present

## 2022-04-16 DIAGNOSIS — J9859 Other diseases of mediastinum, not elsewhere classified: Secondary | ICD-10-CM | POA: Insufficient documentation

## 2022-04-16 MED ORDER — IOHEXOL 300 MG/ML  SOLN
75.0000 mL | Freq: Once | INTRAMUSCULAR | Status: AC | PRN
  Administered 2022-04-16: 75 mL via INTRAVENOUS

## 2022-04-16 MED ORDER — SODIUM CHLORIDE (PF) 0.9 % IJ SOLN
INTRAMUSCULAR | Status: AC
  Filled 2022-04-16: qty 50

## 2022-04-18 ENCOUNTER — Other Ambulatory Visit: Payer: Self-pay | Admitting: Nurse Practitioner

## 2022-04-18 ENCOUNTER — Telehealth: Payer: Self-pay | Admitting: Nurse Practitioner

## 2022-04-18 DIAGNOSIS — I7090 Unspecified atherosclerosis: Secondary | ICD-10-CM

## 2022-04-18 NOTE — Telephone Encounter (Signed)
Patient returned call. Discussed CT results over the phone.

## 2022-04-29 NOTE — Progress Notes (Unsigned)
Cardiology Office Note:    Date:  04/30/2022   ID:  Alex Charles, DOB 1966-01-31, MRN [id number]  PCP:  Fenton Foy, NP  Cardiologist:  None  Electrophysiologist:  None   Referring MD: Fenton Foy, NP   Chief Complaint  Patient presents with   Coronary Artery Disease    History of Present Illness:    Alex Charles is a 56 y.o. male with a hx of CAD, chronic systolic heart failure, tobacco use, former tobacco use who is referred by Alex Arms, NP for evaluation of CAD.  He previously followed with Dr. Marlou Charles, last seen 09/2015.  Has a history of CAD and underwent PCI in [place] in 2016.  Echocardiogram showed EF 30%.  However repeat echocardiogram 08/09/2015 showed EF improved to 60 to 65%, grade 1 diastolic dysfunction, no significant valvular disease.  He denies any chest pain but reports has been having dyspnea on exertion.  States that walking up 1 flight of stairs will feel short of breath.  He has not been exercising.  Denies any lightheadedness, syncope, or lower extremity edema.  Reports has been having palpitations 1-2 times per week where feels like heart is racing, can last for hours.  Reports pain in both legs with walking.  He continues to smoke, 8 to 10 cigarettes/day.  Family history includes paternal uncle and paternal grandfather died of MIs in 36s.   Past Medical History:  Diagnosis Date   Alcoholism in recovery [facility])    happened 4 years ago/ went thru detox in the hospital   Allergy    seasonal   Anxiety    Blockage of coronary artery of heart ([place])    1 stent put in/ in 2015   CHF (congestive heart failure) ([place])    Depression    Hyperlipidemia    Prediabetes     Past Surgical History:  Procedure Laterality Date   CORONARY ANGIOPLASTY WITH STENT PLACEMENT  2015   one stent   OTHER SURGICAL HISTORY     tooth extracted      Current Medications: Current Meds  Medication Sig   albuterol (VENTOLIN HFA) 108 (90 Base) MCG/ACT inhaler Inhale 2  puffs into the lungs every 4 (four) hours as needed for wheezing or shortness of breath.   aspirin EC 81 MG tablet Take 1 tablet (81 mg total) by mouth daily.   cetirizine (ZYRTEC) 10 MG tablet Take 1 tablet (10 mg total) by mouth daily.   diclofenac (VOLTAREN) 75 MG EC tablet Take 1 tablet (75 mg total) by mouth 2 (two) times daily as needed.   diclofenac Sodium (VOLTAREN) 1 % GEL Apply 4 g topically 4 (four) times daily. Apply to affected areas 4 times daily as needed for pain.   fenofibrate 160 MG tablet TAKE 1 TABLET BY MOUTH EVERY DAY   hydrOXYzine (ATARAX) 10 MG tablet TAKE 1 TABLET BY MOUTH THREE TIMES A DAY AS NEEDED   nicotine polacrilex (NICORETTE) 2 MG gum Take 1 each (2 mg total) by mouth as needed for smoking cessation.   traZODone (DESYREL) 50 MG tablet TAKE 1 TABLET BY MOUTH AT BEDTIME AS NEEDED FOR SLEEP.   [DISCONTINUED] rosuvastatin (CRESTOR) 20 MG tablet TAKE 1 TABLET BY MOUTH EVERY DAY IN THE EVENING     Allergies:   Patient has no known allergies.   Social History   Socioeconomic History   Marital status: Single    Spouse name: Not on file   Number of children: Not on file  Years of education: Not on file   Highest education level: Not on file  Occupational History   Not on file  Tobacco Use   Smoking status: Every Day    Packs/day: 0.50    Types: Cigarettes   Smokeless tobacco: Never  Vaping Use   Vaping Use: Never used  Substance and Sexual Activity   Alcohol use: No   Drug use: Never   Sexual activity: Not Currently  Other Topics Concern   Not on file  Social History Narrative   Not on file   Social Determinants of Health   Financial Resource Strain: Not on file  Food Insecurity: Not on file  Transportation Needs: Not on file  Physical Activity: Not on file  Stress: Not on file  Social Connections: Not on file     Family History: The patient's family history includes Bladder Cancer in his mother; Crohn's disease in his mother; Hypertension  in his father; Irritable bowel syndrome in his sister.  ROS:   Please see the history of present illness.     All other systems reviewed and are negative.  EKGs/Labs/Other Studies Reviewed:    The following studies were reviewed today:   EKG:   04/30/2022: Normal sinus rhythm, rate 75, nonspecific T wave flattening  Recent Labs: 08/28/2021: BUN 18; Creatinine, Ser 1.09; Potassium 4.7; Sodium 139  Recent Lipid Panel    Component Value Date/Time   CHOL 143 12/04/2021 1054   TRIG 126 12/04/2021 1054   HDL 39 (L) 12/04/2021 1054   CHOLHDL 3.7 12/04/2021 1054   CHOLHDL 7.7 (H) 04/08/2017 1427   VLDL 74 (H) 08/14/2016 1322   LDLCALC 81 12/04/2021 1054   [place]  04/08/2017 1427     Comment:     . LDL cholesterol not calculated. Triglyceride levels greater than 400 mg/dL invalidate calculated LDL results. . Reference range: <100 . Desirable range <100 mg/dL for primary prevention;   <70 mg/dL for patients with CHD or diabetic patients  with > or = 2 CHD risk factors. Marland Kitchen LDL-C is now calculated using the Martin-Hopkins  calculation, which is a validated novel method providing  better accuracy than the Friedewald equation in the  estimation of LDL-C.  Cresenciano Genre et al. Annamaria Helling. 3810;175(10): 2061-2068  (http://education.[employer].com/faq/FAQ164)     Physical Exam:    VS:  BP 110/80 (BP Location: Left Arm, Patient Position: Sitting, Cuff Size: Normal)   Pulse 86   Ht 5' 8.5" (1.74 m)   Wt 211 lb 6.4 oz (95.9 kg)   SpO2 98%   BMI 31.68 kg/m     Wt Readings from Last 3 Encounters:  04/30/22 211 lb 6.4 oz (95.9 kg)  04/09/22 207 lb (93.9 kg)  03/06/22 215 lb 12.8 oz (97.9 kg)     GEN:  Well nourished, well developed in no acute distress HEENT: Normal NECK: No JVD; No carotid bruits LYMPHATICS: No lymphadenopathy CARDIAC: RRR, no murmurs, rubs, gallops RESPIRATORY:  Clear to auscultation without rales, wheezing or rhonchi  ABDOMEN: Soft, non-tender,  non-distended MUSCULOSKELETAL:  No edema; No deformity  SKIN: Warm and dry NEUROLOGIC:  Alert and oriented x 3 PSYCHIATRIC:  Normal affect   ASSESSMENT:    1. Shortness of breath   2. Coronary artery disease involving native coronary artery of native heart, unspecified whether angina present   3. Chronic systolic heart failure ([place])   4. Hyperlipidemia, unspecified hyperlipidemia type   5. Palpitations   6. PAD (peripheral artery disease) ([place])   7. Tobacco  use    PLAN:    CAD: Status post stent placement in [place] in 2016.  Appears mid LAD from CT chest 03/2022.  He is reporting dyspnea on exertion -Continue aspirin 81 mg daily -Continue rosuvastatin, will increase to 40 mg daily for goal LDL less than 55 -Recommend exercise Myoview to evaluate for ischemia  Chronic systolic heart failure: EF as low as 30% in [place].  Echo 07/2015 showed EF improved to 60 to 65%.  He is reporting dyspnea on exertion -Check echo  PAD: Reported history.  He is reporting pain in both legs with walking.  Check ABIs  Palpitations: Description concerning for arrhythmia, will evaluate with Zio patch x2 weeks  Hyperlipidemia: Continue rosuvastatin 20 mg daily and fenofibrate 160 mg daily.  LDL 81 on 12/04/2021.  Goal LDL less than 55 given CAD history as above, recommend increase rosuvastatin to 40 mg daily  Tobacco use: Patient counseled on the risk of tobacco use and cessation strongly recommended.  He is interested in trying nicotine gum, will prescribe  RTC in 3 months  Shared Decision Making/Informed Consent The risks [chest pain, shortness of breath, cardiac arrhythmias, dizziness, blood pressure fluctuations, myocardial infarction, stroke/transient ischemic attack, nausea, vomiting, allergic reaction, radiation exposure, metallic taste sensation and life-threatening complications (estimated to be 1 in 10,000)], benefits (risk stratification, diagnosing coronary artery disease, treatment guidance) and  alternatives of a nuclear stress test were discussed in detail with Mr. Franchino and he agrees to proceed.    Medication Adjustments/Labs and Tests Ordered: Current medicines are reviewed at length with the patient today.  Concerns regarding medicines are outlined above.  Orders Placed This Encounter  Procedures   MYOCARDIAL PERFUSION IMAGING   LONG TERM MONITOR (3-14 DAYS)   EKG 12-Lead   ECHOCARDIOGRAM COMPLETE   VAS [place] ABI WITH/WO TBI   VAS [place] LOWER EXTREMITY ARTERIAL DUPLEX   Meds ordered this encounter  Medications   nicotine polacrilex (NICORETTE) 2 MG gum    Sig: Take 1 each (2 mg total) by mouth as needed for smoking cessation.    Dispense:  100 tablet    Refill:  0   rosuvastatin (CRESTOR) 40 MG tablet    Sig: Take 1 tablet (40 mg total) by mouth daily.    Dispense:  90 tablet    Refill:  3    Dose increase    Patient Instructions  Medication Instructions:  INCREASE rosuvastatin (Crestor) to 40 mg daily Use nicotine gum as directed  *If you need a refill on your cardiac medications before your next appointment, please call your pharmacy*  Testing/Procedures: Your physician has requested that you have an echocardiogram. Echocardiography is a painless test that uses sound waves to create images of your heart. It provides your doctor with information about the size and shape of your heart and how well your heart's chambers and valves are working. This procedure takes approximately one hour. There are no restrictions for this procedure. Please do NOT wear cologne, perfume, aftershave, or lotions (deodorant is allowed). Please arrive 15 minutes prior to your appointment time.  Your physician has requested that you have an exercise stress myoview. For further information please visit [email or web address]. Please follow instruction sheet, as given.  Your physician has requested that you have an ankle brachial index (ABI). During this test an ultrasound and blood pressure cuff  are used to evaluate the arteries that supply the Charles and legs with blood. Allow thirty minutes for this exam. There are no restrictions or  special instructions.   ZIO XT- Long Term Monitor Instructions   Your physician has requested you wear a ZIO patch monitor for _14__ days.  This is a single patch monitor.   [employer] supplies one patch monitor per enrollment. Additional stickers are not available. Please do not apply patch if you will be having a Nuclear Stress Test, Echocardiogram, Cardiac CT, MRI, or Chest Xray during the period you would be wearing the monitor. The patch cannot be worn during these tests. You cannot remove and re-apply the ZIO XT patch monitor.  Your ZIO patch monitor will be sent Fed Ex from [employer] directly to your home address. It may take 3-5 days to receive your monitor after you have been enrolled.  Once you have received your monitor, please review the enclosed instructions. Your monitor has already been registered assigning a specific monitor serial # to you.  Billing and Patient Assistance Program Information   We have supplied [employer] with any of your insurance information on file for billing purposes. [employer] offers a sliding scale Patient Assistance Program for patients that do not have insurance, or whose insurance does not completely cover the cost of the ZIO monitor.   You must apply for the Patient Assistance Program to qualify for this discounted rate.     To apply, please call [employer] at [phone], select option 4, then select option 2, and ask to apply for Patient Assistance Program.  Theodore Demark will ask your household income, and how many people are in your household.  They will quote your out-of-pocket cost based on that information.  [employer] will also be able to set up a 49-month interest-free payment plan if needed.  Applying the monitor   Shave hair from upper left chest.  Hold abrader disc by orange tab. Rub abrader in 40 strokes over  the upper left chest as indicated in your monitor instructions.  Clean area with 4 enclosed alcohol pads. Let dry.  Apply patch as indicated in monitor instructions. Patch will be placed under collarbone on left side of chest with arrow pointing upward.  Rub patch adhesive wings for 2 minutes. Remove white label marked "1". Remove the white label marked "2". Rub patch adhesive wings for 2 additional minutes.  While looking in a mirror, press and release button in center of patch. A small green light will flash 3-4 times. This will be your only indicator that the monitor has been turned on. ?  Do not shower for the first 24 hours. You may shower after the first 24 hours.  Press the button if you feel a symptom. You will hear a small click. Record Date, Time and Symptom in the Patient Logbook.  When you are ready to remove the patch, follow instructions on the last 2 pages of the Patient Logbook. Stick patch monitor onto the last page of Patient Logbook.  Place Patient Logbook in the blue and white box.  Use locking tab on box and tape box closed securely.  The blue and white box has prepaid postage on it. Please place it in the mailbox as soon as possible. Your physician should have your test results approximately 7 days after the monitor has been [facility]  Call [employer] [phone] you have questions regarding your ZIO XT patch monitor. Call them immediately if you see an orange light blinking on your monitor.  If your monitor falls off in less than 4 days, contact our Monitor department at [phone] ?  If your monitor becomes loose or falls off after 4 days call [employer] at [phone] for suggestions on securing your monitor.?  Follow-Up: At [facility], you and your health needs are our priority.  As part of our continuing mission to provide you with exceptional heart care, we have created designated Provider Care Teams.  These Care  Teams include your primary Cardiologist (physician) and Advanced Practice Providers (APPs -  Physician Assistants and Nurse Practitioners) who all work together to provide you with the care you need, when you need it.  We recommend signing up for the patient portal called "MyChart".  Sign up information is provided on this After Visit Summary.  MyChart is used to connect with patients for Virtual Visits (Telemedicine).  Patients are able to view lab/test results, encounter notes, upcoming appointments, etc.  Non-urgent messages can be sent to your provider as well.   To learn more about what you can do with MyChart, go to [email or web address].    Your next appointment:   3 month(s)  The format for your next appointment:   In Person  Provider:   Dr. Gardiner Rhyme       Signed, Donato Heinz, MD  04/30/2022 4:02 PM    [place]

## 2022-04-30 ENCOUNTER — Ambulatory Visit (INDEPENDENT_AMBULATORY_CARE_PROVIDER_SITE_OTHER): Payer: BC Managed Care – PPO

## 2022-04-30 ENCOUNTER — Encounter: Payer: Self-pay | Admitting: Cardiology

## 2022-04-30 ENCOUNTER — Ambulatory Visit: Payer: BC Managed Care – PPO | Attending: Cardiology | Admitting: Cardiology

## 2022-04-30 ENCOUNTER — Other Ambulatory Visit: Payer: Self-pay | Admitting: Cardiology

## 2022-04-30 VITALS — BP 110/80 | HR 86 | Ht 68.5 in | Wt 211.4 lb

## 2022-04-30 DIAGNOSIS — I739 Peripheral vascular disease, unspecified: Secondary | ICD-10-CM

## 2022-04-30 DIAGNOSIS — I2510 Atherosclerotic heart disease of native coronary artery without angina pectoris: Secondary | ICD-10-CM

## 2022-04-30 DIAGNOSIS — I5022 Chronic systolic (congestive) heart failure: Secondary | ICD-10-CM

## 2022-04-30 DIAGNOSIS — Z720 Tobacco use: Secondary | ICD-10-CM

## 2022-04-30 DIAGNOSIS — R0602 Shortness of breath: Secondary | ICD-10-CM

## 2022-04-30 DIAGNOSIS — R002 Palpitations: Secondary | ICD-10-CM

## 2022-04-30 DIAGNOSIS — E785 Hyperlipidemia, unspecified: Secondary | ICD-10-CM | POA: Diagnosis not present

## 2022-04-30 MED ORDER — NICOTINE POLACRILEX 2 MG MT GUM: 2.0000 mg | CHEWING_GUM | OROMUCOSAL | 0 refills | Status: DC | PRN

## 2022-04-30 MED ORDER — ROSUVASTATIN CALCIUM 40 MG PO TABS: 40.0000 mg | ORAL_TABLET | Freq: Every day | ORAL | 3 refills | Status: DC

## 2022-04-30 NOTE — Progress Notes (Unsigned)
Enrolled patient for a 14 day Zio XT  monitor to be mailed to patients home  °

## 2022-04-30 NOTE — Patient Instructions (Signed)
Medication Instructions:  INCREASE rosuvastatin (Crestor) to 40 mg daily Use nicotine gum as directed  *If you need a refill on your cardiac medications before your next appointment, please call your pharmacy*  Testing/Procedures: Your physician has requested that you have an echocardiogram. Echocardiography is a painless test that uses sound waves to create images of your heart. It provides your doctor with information about the size and shape of your heart and how well your heart's chambers and valves are working. This procedure takes approximately one hour. There are no restrictions for this procedure. Please do NOT wear cologne, perfume, aftershave, or lotions (deodorant is allowed). Please arrive 15 minutes prior to your appointment time.  Your physician has requested that you have an exercise stress myoview. For further information please visit [email or web address]. Please follow instruction sheet, as given.  Your physician has requested that you have an ankle brachial index (ABI). During this test an ultrasound and blood pressure cuff are used to evaluate the arteries that supply the arms and legs with blood. Allow thirty minutes for this exam. There are no restrictions or special instructions.   ZIO XT- Long Term Monitor Instructions   Your physician has requested you wear a ZIO patch monitor for _14__ days.  This is a single patch monitor.   [employer] supplies one patch monitor per enrollment. Additional stickers are not available. Please do not apply patch if you will be having a Nuclear Stress Test, Echocardiogram, Cardiac CT, MRI, or Chest Xray during the period you would be wearing the monitor. The patch cannot be worn during these tests. You cannot remove and re-apply the ZIO XT patch monitor.  Your ZIO patch monitor will be sent Fed Ex from [employer] directly to your home address. It may take 3-5 days to receive your monitor after you have been enrolled.  Once you have  received your monitor, please review the enclosed instructions. Your monitor has already been registered assigning a specific monitor serial # to you.  Billing and Patient Assistance Program Information   We have supplied [employer] with any of your insurance information on file for billing purposes. [employer] offers a sliding scale Patient Assistance Program for patients that do not have insurance, or whose insurance does not completely cover the cost of the ZIO monitor.   You must apply for the Patient Assistance Program to qualify for this discounted rate.     To apply, please call [employer] at [phone], select option 4, then select option 2, and ask to apply for Patient Assistance Program.  Theodore Demark will ask your household income, and how many people are in your household.  They will quote your out-of-pocket cost based on that information.  [employer] will also be able to set up a 33-month interest-free payment plan if needed.  Applying the monitor   Shave hair from upper left chest.  Hold abrader disc by orange tab. Rub abrader in 40 strokes over the upper left chest as indicated in your monitor instructions.  Clean area with 4 enclosed alcohol pads. Let dry.  Apply patch as indicated in monitor instructions. Patch will be placed under collarbone on left side of chest with arrow pointing upward.  Rub patch adhesive wings for 2 minutes. Remove white label marked "1". Remove the white label marked "2". Rub patch adhesive wings for 2 additional minutes.  While looking in a mirror, press and release button in center of patch. A small green light will flash 3-4 times. This will be your only indicator  that the monitor has been turned on. ?  Do not shower for the first 24 hours. You may shower after the first 24 hours.  Press the button if you feel a symptom. You will hear a small click. Record Date, Time and Symptom in the Patient Logbook.  When you are ready to remove the patch, follow instructions on the  last 2 pages of the Patient Logbook. Stick patch monitor onto the last page of Patient Logbook.  Place Patient Logbook in the blue and white box.  Use locking tab on box and tape box closed securely.  The blue and white box has prepaid postage on it. Please place it in the mailbox as soon as possible. Your physician should have your test results approximately 7 days after the monitor has been mailed back to [facility].  Call [place] at [phone] if you have questions regarding your ZIO XT patch monitor. Call them immediately if you see an orange light blinking on your monitor.  If your monitor falls off in less than 4 days, contact our Monitor department at [phone]. ?If your monitor becomes loose or falls off after 4 days call [employer] at [phone] for suggestions on securing your monitor.?  Follow-Up: At [facility], you and your health needs are our priority.  As part of our continuing mission to provide you with exceptional heart care, we have created designated Provider Care Teams.  These Care Teams include your primary Cardiologist (physician) and Advanced Practice Providers (APPs -  Physician Assistants and Nurse Practitioners) who all work together to provide you with the care you need, when you need it.  We recommend signing up for the patient portal called "MyChart".  Sign up information is provided on this After Visit Summary.  MyChart is used to connect with patients for Virtual Visits (Telemedicine).  Patients are able to view lab/test results, encounter notes, upcoming appointments, etc.  Non-urgent messages can be sent to your provider as well.   To learn more about what you can do with MyChart, go to [email or web address].    Your next appointment:   3 month(s)  The format for your next appointment:   In Person  Provider:   Dr. Gardiner Rhyme

## 2022-05-02 ENCOUNTER — Telehealth (HOSPITAL_COMMUNITY): Payer: Self-pay | Admitting: *Deleted

## 2022-05-02 NOTE — Telephone Encounter (Signed)
Left message on voicemail per DPR in reference to upcoming appointment scheduled on 05/07/2022 at 8:00 with detailed instructions given per Myocardial Perfusion Study Information Sheet for the test. LM to arrive 15 minutes early, and that it is imperative to arrive on time for appointment to keep from having the test rescheduled. If you need to cancel or reschedule your appointment, please call the office within 24 hours of your appointment. Failure to do so may result in a cancellation of your appointment, and a $50 no show fee. Phone number given for call back for any questions.

## 2022-05-04 DIAGNOSIS — R002 Palpitations: Secondary | ICD-10-CM | POA: Diagnosis not present

## 2022-05-07 ENCOUNTER — Ambulatory Visit (HOSPITAL_COMMUNITY): Payer: BC Managed Care – PPO

## 2022-05-07 ENCOUNTER — Encounter (HOSPITAL_COMMUNITY): Payer: BC Managed Care – PPO

## 2022-05-07 ENCOUNTER — Ambulatory Visit (HOSPITAL_COMMUNITY): Payer: BC Managed Care – PPO | Attending: Cardiology

## 2022-05-07 DIAGNOSIS — R0602 Shortness of breath: Secondary | ICD-10-CM | POA: Diagnosis not present

## 2022-05-07 LAB — MYOCARDIAL PERFUSION IMAGING
Angina Index: 0
Duke Treadmill Score: 9
Estimated workload: 10.1
Exercise duration (min): 9 min
Exercise duration (sec): 0 s
LV dias vol: 80 mL (ref 62–150)
LV sys vol: 39 mL
MPHR: 162 {beats}/min
Nuc Stress EF: 51 %
Peak HR: 146 {beats}/min
Percent HR: 90 %
RPE: 19
Rest HR: 55 {beats}/min
Rest Nuclear Isotope Dose: 10.6 mCi
SDS: 1
SRS: 1
SSS: 2
ST Depression (mm): 0 mm
Stress Nuclear Isotope Dose: 32.4 mCi
TID: 0.82

## 2022-05-07 MED ORDER — REGADENOSON 0.4 MG/5ML IV SOLN: 0.4000 mg | Freq: Once | INTRAVENOUS | Status: DC

## 2022-05-07 MED ORDER — TECHNETIUM TC 99M TETROFOSMIN IV KIT
10.6000 | PACK | Freq: Once | INTRAVENOUS | Status: AC | PRN
  Administered 2022-05-07: 10.6 via INTRAVENOUS

## 2022-05-07 MED ORDER — TECHNETIUM TC 99M TETROFOSMIN IV KIT
32.4000 | PACK | Freq: Once | INTRAVENOUS | Status: AC | PRN
  Administered 2022-05-07: 32.4 via INTRAVENOUS

## 2022-05-14 ENCOUNTER — Other Ambulatory Visit: Payer: Self-pay | Admitting: Nurse Practitioner

## 2022-05-14 DIAGNOSIS — F419 Anxiety disorder, unspecified: Secondary | ICD-10-CM

## 2022-05-14 MED ORDER — BUSPIRONE HCL 30 MG PO TABS: 30.0000 mg | ORAL_TABLET | Freq: Two times a day (BID) | ORAL | 0 refills | Status: AC

## 2022-05-21 ENCOUNTER — Ambulatory Visit (HOSPITAL_COMMUNITY)
Admission: RE | Admit: 2022-05-21 | Discharge: 2022-05-21 | Disposition: A | Discharge status: Home / Self Care | Payer: BC Managed Care – PPO | Source: Ambulatory Visit | Attending: Cardiology | Admitting: Cardiology

## 2022-05-21 DIAGNOSIS — I739 Peripheral vascular disease, unspecified: Secondary | ICD-10-CM | POA: Insufficient documentation

## 2022-05-23 ENCOUNTER — Other Ambulatory Visit: Payer: Self-pay | Admitting: *Deleted

## 2022-05-23 DIAGNOSIS — I739 Peripheral vascular disease, unspecified: Secondary | ICD-10-CM

## 2022-05-29 DIAGNOSIS — R002 Palpitations: Secondary | ICD-10-CM | POA: Diagnosis not present

## 2022-06-03 ENCOUNTER — Other Ambulatory Visit: Payer: Self-pay | Admitting: *Deleted

## 2022-06-03 MED ORDER — METOPROLOL SUCCINATE ER 25 MG PO TB24: 25.0000 mg | ORAL_TABLET | Freq: Every day | ORAL | 3 refills | Status: DC

## 2022-06-11 ENCOUNTER — Ambulatory Visit: Payer: BC Managed Care – PPO | Attending: Cardiovascular Disease | Admitting: Cardiovascular Disease

## 2022-06-11 ENCOUNTER — Encounter: Payer: Self-pay | Admitting: Cardiovascular Disease

## 2022-06-11 VITALS — BP 122/70 | HR 70 | Ht 68.5 in | Wt 210.0 lb

## 2022-06-11 DIAGNOSIS — I739 Peripheral vascular disease, unspecified: Secondary | ICD-10-CM

## 2022-06-11 MED ORDER — CILOSTAZOL 50 MG PO TABS: 50.0000 mg | ORAL_TABLET | Freq: Two times a day (BID) | ORAL | 2 refills | Status: DC

## 2022-06-11 NOTE — Patient Instructions (Signed)
Medication Instructions:  Your physician has recommended you make the following change in your medication:   -Start taking cilostazol (pletal) '50mg'$  twice daily.   *If you need a refill on your cardiac medications before your next appointment, please call your pharmacy*   Follow-Up: At [facility], you and your health needs are our priority.  As part of our continuing mission to provide you with exceptional heart care, we have created designated Provider Care Teams.  These Care Teams include your primary Cardiologist (physician) and Advanced Practice Providers (APPs -  Physician Assistants and Nurse Practitioners) who all work together to provide you with the care you need, when you need it.  We recommend signing up for the patient portal called "MyChart".  Sign up information is provided on this After Visit Summary.  MyChart is used to connect with patients for Virtual Visits (Telemedicine).  Patients are able to view lab/test results, encounter notes, upcoming appointments, etc.  Non-urgent messages can be sent to your provider as well.   To learn more about what you can do with MyChart, go to [email or web address].    Your next appointment:   3 month(s)  The format for your next appointment:   In Person  Provider:   Quay Burow, MD  Other Instructions PV only

## 2022-06-11 NOTE — Assessment & Plan Note (Signed)
Mr. Fells was referred to me by Dr. Gardiner Rhyme  for evaluation and treatment of PAD.  He has a history of CAD, treated hypertension, hyperlipidemia and ongoing tobacco abuse.  He had lifestyle-limiting claudication for 5 to 7 years.  He gets pain in his calves after walking 50 yards.  He works as a Actor at [employer].  Recent Dopplers performed 05/21/2022 revealed ABIs in the 0.6 range with occluded SFAs bilaterally.  We talked about treatment options including endovascular therapy initially versus empiric trial of Pletal.  We decided on the latter.  I am going to begin him on Pletal 50 mg p.o. twice daily and will see him in 3 months.  If at the end of that time he is still symptomatic we will proceed with endovascular therapy.

## 2022-06-11 NOTE — Progress Notes (Signed)
06/11/2022 [name]   29-Feb-1966  [id number]  Primary Physician Fenton Foy, NP Primary Cardiologist: Lorretta Harp MD Lupe Carney, [place]  HPI:  Alex Charles is a 56 y.o. 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  He was referred by his primary cardiologist, Dr. Gardiner Rhyme , for peripheral vascular evaluation and treatment.  His cardiac risk factor profile is notable for ongoing tobacco abuse having smoked 40 pack years now smoking 8 cigarettes a day.  History of hypertension hyperlipidemia.  There is no family history for heart disease.  He has had a stent in his heart in [facility] April 2016.  Recent Myoview stress test was nonischemic.  He denies chest pain or shortness of breath.  He does have symmetric claudication of the last 5 to 7 years which is lifestyle limiting.  Recent Dopplers performed 05/21/2022 revealed ABIs in the 0.6 range with occluded SFAs bilaterally.   Current Meds  Medication Sig   albuterol (VENTOLIN HFA) 108 (90 Base) MCG/ACT inhaler Inhale 2 puffs into the lungs every 4 (four) hours as needed for wheezing or shortness of breath.   aspirin EC 81 MG tablet Take 1 tablet (81 mg total) by mouth daily.   busPIRone (BUSPAR) 30 MG tablet Take 1 tablet (30 mg total) by mouth in the morning and at bedtime.   cetirizine (ZYRTEC) 10 MG tablet Take 1 tablet (10 mg total) by mouth daily.   diclofenac (VOLTAREN) 75 MG EC tablet Take 1 tablet (75 mg total) by mouth 2 (two) times daily as needed.   diclofenac Sodium (VOLTAREN) 1 % GEL Apply 4 g topically 4 (four) times daily. Apply to affected areas 4 times daily as needed for pain.   fenofibrate 160 MG tablet TAKE 1 TABLET BY MOUTH EVERY DAY   hydrOXYzine (ATARAX) 10 MG tablet TAKE 1 TABLET BY MOUTH THREE TIMES A DAY AS NEEDED   metoprolol succinate (TOPROL XL) 25 MG 24 hr tablet Take 1 tablet (25 mg total) by mouth daily.    nicotine polacrilex (NICORETTE) 2 MG gum Take 1 each (2 mg total) by mouth every 4 (four) hours while awake.   rosuvastatin (CRESTOR) 40 MG tablet Take 1 tablet (40 mg total) by mouth daily.   traZODone (DESYREL) 50 MG tablet TAKE 1 TABLET BY MOUTH AT BEDTIME AS NEEDED FOR SLEEP.     No Known Allergies  Social History   Socioeconomic History   Marital status: Single    Spouse name: Not on file   Number of children: Not on file   Years of education: Not on file   Highest education level: Not on file  Occupational History   Not on file  Tobacco Use   Smoking status: Every Day    Packs/day: 0.50    Types: Cigarettes   Smokeless tobacco: Never  Vaping Use   Vaping Use: Never used  Substance and Sexual Activity   Alcohol use: No   Drug use: Never   Sexual activity: Not Currently  Other Topics Concern   Not on file  Social History Narrative   Not on file   Social Determinants of Health   Financial Resource Strain: Not on file  Food Insecurity: Not on file  Transportation Needs: Not on file  Physical Activity: Not on file  Stress: Not on file  Social Connections: Not on file  Intimate Partner Violence: Not on  file     Review of Systems: General: negative for chills, fever, night sweats or weight changes.  Cardiovascular: negative for chest pain, dyspnea on exertion, edema, orthopnea, palpitations, paroxysmal nocturnal dyspnea or shortness of breath Dermatological: negative for rash Respiratory: negative for cough or wheezing Urologic: negative for hematuria Abdominal: negative for nausea, vomiting, diarrhea, bright red blood per rectum, melena, or hematemesis Neurologic: negative for visual changes, syncope, or dizziness All other systems reviewed and are otherwise negative except as noted above.    Blood pressure 122/70, pulse 70, height 5' 8.5" (1.74 m), weight 210 lb (95.3 kg).  General appearance: alert and no distress Neck: no adenopathy, no carotid bruit,  no JVD, supple, symmetrical, trachea midline, and thyroid not enlarged, symmetric, no tenderness/mass/nodules Lungs: clear to auscultation bilaterally Heart: regular rate and rhythm, S1, S2 normal, no murmur, click, rub or gallop Extremities: extremities normal, atraumatic, no cyanosis or edema Pulses: Diminished pedal pulses Skin: Skin color, texture, turgor normal. No rashes or lesions Neurologic: Grossly normal  EKG not performed today  ASSESSMENT AND PLAN:   Peripheral arterial disease [facility]) Mr. Postiglione was referred to me by Dr. Gardiner Rhyme  for evaluation and treatment of PAD.  He has a history of CAD, treated hypertension, hyperlipidemia and ongoing tobacco abuse.  He had lifestyle-limiting claudication for 5 to 7 years.  He gets pain in his calves after walking 50 yards.  He works as a Actor at [employer].  Recent Dopplers performed 05/21/2022 revealed ABIs in the 0.6 range with occluded SFAs bilaterally.  We talked about treatment options including endovascular therapy initially versus empiric trial of Pletal.  We decided on the latter.  I am going to begin him on Pletal 50 mg p.o. twice daily and will see him in 3 months.  If at the end of that time he is still symptomatic we will proceed with endovascular therapy.     Lorretta Harp MD FACP,FACC,FAHA, [facility] 06/11/2022 10:13 AM

## 2022-06-14 ENCOUNTER — Other Ambulatory Visit: Payer: Self-pay | Admitting: Nurse Practitioner

## 2022-06-14 DIAGNOSIS — F32A Depression, unspecified: Secondary | ICD-10-CM

## 2022-06-19 ENCOUNTER — Other Ambulatory Visit: Payer: Self-pay | Admitting: Nurse Practitioner

## 2022-06-19 DIAGNOSIS — F419 Anxiety disorder, unspecified: Secondary | ICD-10-CM

## 2022-06-19 MED ORDER — BUSPIRONE HCL 30 MG PO TABS: 30.0000 mg | ORAL_TABLET | Freq: Two times a day (BID) | ORAL | 2 refills | Status: DC

## 2022-06-19 NOTE — Telephone Encounter (Signed)
Caller & Relationship to patient:  MRN #  [id number]   Call Back Number:   Date of Last Office Visit: 06/14/2022     Date of Next Office Visit: 07/09/2022    Medication(s) to be Refilled: Buspirone  Preferred Pharmacy:   ** Please notify patient to allow 48-72 hours to process** **Let patient know to contact pharmacy at the end of the day to make sure medication is ready. ** **If patient has not been seen in a year or longer, book an appointment **Advise to use MyChart for refill requests OR to contact their pharmacy

## 2022-06-20 NOTE — Telephone Encounter (Signed)
I refilled that yesterday.

## 2022-06-20 NOTE — Telephone Encounter (Signed)
Please advise if you would like to have pt buspar refilled. [place]

## 2022-06-25 ENCOUNTER — Ambulatory Visit (HOSPITAL_COMMUNITY): Payer: BC Managed Care – PPO | Attending: Cardiology

## 2022-06-25 ENCOUNTER — Other Ambulatory Visit: Payer: Self-pay | Admitting: Cardiology

## 2022-06-25 DIAGNOSIS — I5022 Chronic systolic (congestive) heart failure: Secondary | ICD-10-CM | POA: Diagnosis not present

## 2022-06-25 LAB — ECHOCARDIOGRAM COMPLETE
Area-P 1/2: 2.48 cm2
S' Lateral: 3.4 cm

## 2022-07-02 ENCOUNTER — Other Ambulatory Visit: Payer: Self-pay | Admitting: Nurse Practitioner

## 2022-07-09 ENCOUNTER — Ambulatory Visit (INDEPENDENT_AMBULATORY_CARE_PROVIDER_SITE_OTHER): Payer: BC Managed Care – PPO | Admitting: Nurse Practitioner

## 2022-07-09 ENCOUNTER — Encounter: Payer: Self-pay | Admitting: Nurse Practitioner

## 2022-07-09 VITALS — BP 122/81 | HR 59 | Temp 97.5°F | Ht 68.5 in | Wt 213.6 lb

## 2022-07-09 DIAGNOSIS — Z23 Encounter for immunization: Secondary | ICD-10-CM | POA: Diagnosis not present

## 2022-07-09 DIAGNOSIS — F411 Generalized anxiety disorder: Secondary | ICD-10-CM

## 2022-07-09 DIAGNOSIS — R7303 Prediabetes: Secondary | ICD-10-CM | POA: Diagnosis not present

## 2022-07-09 MED ORDER — ALBUTEROL SULFATE HFA 108 (90 BASE) MCG/ACT IN AERS: 2.0000 | INHALATION_SPRAY | RESPIRATORY_TRACT | 0 refills | Status: DC | PRN

## 2022-07-09 NOTE — Patient Instructions (Addendum)
1. Prediabetes  - CBC - Comprehensive metabolic panel    2. Generalized anxiety disorder  -continue current medications    Follow up:  Follow up in 3 months

## 2022-07-09 NOTE — Addendum Note (Signed)
Addended by: Elyse Jarvis on: 07/09/2022 12:09 PM   Modules accepted: Orders

## 2022-07-09 NOTE — Progress Notes (Signed)
$'@Patient'M$  ID: Alex Charles, male    DOB: [birth year], 57 y.o.   MRN: [id number]  Chief Complaint  Patient presents with   Anxiety    Doing great per pt he feels drained.    Referring provider: Fenton Foy, NP   HPI  Alex Charles presents for follow up. He  has a past medical history of Alcoholism in recovery [facility]), Allergy, Anxiety, Blockage of coronary artery of heart ([place]), CHF (congestive heart failure) ([place]), Depression, Hyperlipidemia, and Prediabetes.     Patient presents today for routine follow-up.  He does have a history of anxiety.  He has been getting counseling with Manuela Schwartz here in the office.  He states that this has helped.  Overall he states that he has been doing well since his last visit here.  His mood has been much improved.  We will check blood work today.  Patient does not need refills today. Denies f/c/s, n/v/d, hemoptysis, PND, leg swelling Denies chest pain or edema        No Known Allergies  Immunization History  Administered Date(s) Administered   Influenza,inj,Quad PF,6+ Mos 04/02/2015, 02/17/2018, 02/13/2019   Moderna Sars-Covid-2 Vaccination 10/17/2019, 11/15/2019, 07/02/2020   Tdap 04/08/2017   Zoster Recombinat (Shingrix) 01/20/2019    Past Medical History:  Diagnosis Date   Alcoholism in recovery ([place])    happened 4 years ago/ went thru detox in the hospital   Allergy    seasonal   Anxiety    Blockage of coronary artery of heart ([place])    1 stent put in/ in 2015   CHF (congestive heart failure) ([place])    Depression    Hyperlipidemia    Prediabetes     Tobacco History: Social History   Tobacco Use  Smoking Status Every Day   Packs/day: 0.50   Types: Cigarettes  Smokeless Tobacco Never   Ready to quit: Not Answered Counseling given: Not Answered   Outpatient Encounter Medications as of 07/09/2022  Medication Sig   aspirin EC 81 MG tablet Take 1 tablet (81 mg total) by mouth daily.   busPIRone (BUSPAR) 30 MG tablet  Take 1 tablet (30 mg total) by mouth 2 (two) times daily.   cetirizine (ZYRTEC) 10 MG tablet Take 1 tablet (10 mg total) by mouth daily.   cilostazol (PLETAL) 50 MG tablet Take 1 tablet (50 mg total) by mouth 2 (two) times daily.   diclofenac (VOLTAREN) 75 MG EC tablet Take 1 tablet (75 mg total) by mouth 2 (two) times daily as needed.   diclofenac Sodium (VOLTAREN) 1 % GEL Apply 4 g topically 4 (four) times daily. Apply to affected areas 4 times daily as needed for pain.   fenofibrate 160 MG tablet TAKE 1 TABLET BY MOUTH EVERY DAY   hydrOXYzine (ATARAX) 10 MG tablet TAKE 1 TABLET BY MOUTH THREE TIMES A DAY AS NEEDED   metoprolol succinate (TOPROL XL) 25 MG 24 hr tablet Take 1 tablet (25 mg total) by mouth daily.   nicotine polacrilex (NICORETTE) 2 MG gum Take 1 each (2 mg total) by mouth every 4 (four) hours while awake.   rosuvastatin (CRESTOR) 40 MG tablet Take 1 tablet (40 mg total) by mouth daily.   traZODone (DESYREL) 50 MG tablet TAKE 1 TABLET BY MOUTH AT BEDTIME AS NEEDED FOR SLEEP.   albuterol (VENTOLIN HFA) 108 (90 Base) MCG/ACT inhaler Inhale 2 puffs into the lungs every 4 (four) hours as needed for wheezing or shortness of breath.   [DISCONTINUED] albuterol (  VENTOLIN HFA) 108 (90 Base) MCG/ACT inhaler Inhale 2 puffs into the lungs every 4 (four) hours as needed for wheezing or shortness of breath. (Patient not taking: Reported on 07/09/2022)   No facility-administered encounter medications on file as of 07/09/2022.     Review of Systems  Review of Systems  Constitutional: Negative.   HENT: Negative.    Cardiovascular: Negative.   Gastrointestinal: Negative.   Allergic/Immunologic: Negative.   Neurological: Negative.   Psychiatric/Behavioral: Negative.         Physical Exam  BP 122/81   Pulse (!) 59   Temp (!) 97.5 F (36.4 C)   Ht 5' 8.5" (1.74 m)   Wt 213 lb 9.6 oz (96.9 kg)   SpO2 98%   BMI 32.01 kg/m   Wt Readings from Last 5 Encounters:  07/09/22 213 lb 9.6  oz (96.9 kg)  06/11/22 210 lb (95.3 kg)  05/07/22 211 lb (95.7 kg)  04/30/22 211 lb 6.4 oz (95.9 kg)  04/09/22 207 lb (93.9 kg)     Physical Exam Vitals and nursing note reviewed.  Constitutional:      General: He is not in acute distress.    Appearance: He is well-developed.  Cardiovascular:     Rate and Rhythm: Normal rate and regular rhythm.  Pulmonary:     Effort: Pulmonary effort is normal.     Breath sounds: Normal breath sounds.  Skin:    General: Skin is warm and dry.  Neurological:     Mental Status: He is alert and oriented to person, place, and time.       Assessment & Plan:   Prediabetes - CBC - Comprehensive metabolic panel    2. Generalized anxiety disorder  -continue current medications    Follow up:  Follow up in 3 months     Fenton Foy, NP 07/09/2022

## 2022-07-09 NOTE — Assessment & Plan Note (Signed)
-  CBC - Comprehensive metabolic panel    2. Generalized anxiety disorder  -continue current medications    Follow up:  Follow up in 3 months

## 2022-07-10 LAB — COMPREHENSIVE METABOLIC PANEL
ALT: 47 IU/L — ABNORMAL HIGH (ref 0–44)
AST: 41 IU/L — ABNORMAL HIGH (ref 0–40)
Albumin/Globulin Ratio: 1.8 (ref 1.2–2.2)
Albumin: 4.7 g/dL (ref 3.8–4.9)
Alkaline Phosphatase: 58 IU/L (ref 44–121)
BUN/Creatinine Ratio: 13 (ref 9–20)
BUN: 17 mg/dL (ref 6–24)
Bilirubin Total: 0.4 mg/dL (ref 0.0–1.2)
CO2: 18 mmol/L — ABNORMAL LOW (ref 20–29)
Calcium: 10.3 mg/dL — ABNORMAL HIGH (ref 8.7–10.2)
Chloride: 106 mmol/L (ref 96–106)
Creatinine, Ser: 1.29 mg/dL — ABNORMAL HIGH (ref 0.76–1.27)
Globulin, Total: 2.6 g/dL (ref 1.5–4.5)
Glucose: 87 mg/dL (ref 70–99)
Potassium: 5 mmol/L (ref 3.5–5.2)
Sodium: 145 mmol/L — ABNORMAL HIGH (ref 134–144)
Total Protein: 7.3 g/dL (ref 6.0–8.5)
eGFR: 64 mL/min/{1.73_m2} (ref >59)

## 2022-07-10 LAB — CBC
Hematocrit: 50.4 % (ref 37.5–51.0)
Hemoglobin: 16.5 g/dL (ref 13.0–17.7)
MCH: 31.3 pg (ref 26.6–33.0)
MCHC: 32.7 g/dL (ref 31.5–35.7)
MCV: 96 fL (ref 79–97)
Platelets: 224 10*3/uL (ref 150–450)
RBC: 5.28 x10E6/uL (ref 4.14–5.80)
RDW: 13.7 % (ref 11.6–15.4)
WBC: 8.5 10*3/uL (ref 3.4–10.8)

## 2022-07-30 ENCOUNTER — Other Ambulatory Visit: Payer: Self-pay | Admitting: Nurse Practitioner

## 2022-08-05 ENCOUNTER — Other Ambulatory Visit: Payer: Self-pay | Admitting: Nurse Practitioner

## 2022-08-05 NOTE — Progress Notes (Unsigned)
Cardiology Office Note:    Date:  08/06/2022   ID:  [name], DOB 06/13/1966, MRN [id number]  PCP:  Fenton Foy, NP  Cardiologist:  None  Electrophysiologist:  None   Referring MD: Fenton Foy, NP   Chief Complaint  Patient presents with   Coronary Artery Disease    History of Present Illness:    Alex Charles is a 57 y.o. male with a hx of CAD, chronic systolic heart failure, tobacco use, former tobacco use who presents for follow-up.  He was referred by Lazaro Arms, NP for evaluation of CAD, initially seen 04/30/2022.  He previously followed with Dr. Marlou Porch, last seen 09/2015.  Has a history of CAD and underwent PCI in [place] in 2016.  Echocardiogram showed EF 30%.  However repeat echocardiogram 08/09/2015 showed EF improved to 60 to 123456, grade 1 diastolic dysfunction, no significant valvular disease.  He denies any chest pain but reports has been having dyspnea on exertion.  States that walking up 1 flight of stairs will feel short of breath.  He has not been exercising.  Denies any lightheadedness, syncope, or lower extremity edema.  Reports has been having palpitations 1-2 times per week where feels like heart is racing, can last for hours.  Reports pain in both legs with walking.  He continues to smoke, 8 to 10 cigarettes/day.  Family history includes paternal uncle and paternal grandfather died of MIs in 29s.  Exercise Myoview on 05/07/2022 showed good exercise capacity (10.1 METS), EF 51%, normal perfusion.  Echocardiogram 06/25/2022 showed normal biventricular function, no significant valvular disease.  Zio patch x 14 days on 05/29/2022 showed 10 episodes of SVT, longest lasting 17 beats; 6 patient triggered events, corresponding to sinus rhythm and 1 episode of ectopic atrial rhythm with rate 85 bpm.  ABIs on 05/21/2022 were 0.64 right, 0.62 left.  Arterial duplex showed total occlusion in right SFA and posterior tibial arteries, total occlusion in the left SFA and  anterior tibial arteries..  Since last clinic visit, he reports that he is doing well.  Reports occasional chest tightness, states seems to correlate with anxiety.  Reports dyspnea has improved.  He is down to smoking 5 to 6 cigarettes/day.  Reports palpitations have significantly improved.  Leg pain is also improved, he is walking daily now for 10 to 15 minutes.   Past Medical History:  Diagnosis Date   Alcoholism in recovery [facility])    happened 4 years ago/ went thru detox in the hospital   Allergy    seasonal   Anxiety    Blockage of coronary artery of heart ([facility])    1 stent put in/ in 2015   CHF (congestive heart failure) ([facility])    Depression    Hyperlipidemia    Prediabetes     Past Surgical History:  Procedure Laterality Date   CORONARY ANGIOPLASTY WITH STENT PLACEMENT  2015   one stent   OTHER SURGICAL HISTORY     tooth extracted      Current Medications: Current Meds  Medication Sig   albuterol (VENTOLIN HFA) 108 (90 Base) MCG/ACT inhaler INHALE 2 PUFFS INTO THE LUNGS EVERY 4 HOURS AS NEEDED FOR WHEEZING OR SHORTNESS OF BREATH.   aspirin EC 81 MG tablet Take 1 tablet (81 mg total) by mouth daily.   busPIRone (BUSPAR) 30 MG tablet Take 1 tablet (30 mg total) by mouth 2 (two) times daily.   cetirizine (ZYRTEC) 10 MG tablet Take 1 tablet (10 mg total) by  mouth daily.   cilostazol (PLETAL) 50 MG tablet Take 1 tablet (50 mg total) by mouth 2 (two) times daily.   diclofenac (VOLTAREN) 75 MG EC tablet Take 1 tablet (75 mg total) by mouth 2 (two) times daily as needed.   diclofenac Sodium (VOLTAREN) 1 % GEL Apply 4 g topically 4 (four) times daily. Apply to affected areas 4 times daily as needed for pain.   fenofibrate 160 MG tablet TAKE 1 TABLET BY MOUTH EVERY DAY   hydrOXYzine (ATARAX) 10 MG tablet TAKE 1 TABLET BY MOUTH THREE TIMES A DAY AS NEEDED   metoprolol succinate (TOPROL XL) 25 MG 24 hr tablet Take 1 tablet (25 mg total) by mouth daily.   nicotine polacrilex  (NICORETTE) 2 MG gum Take 1 each (2 mg total) by mouth every 4 (four) hours while awake.   rosuvastatin (CRESTOR) 40 MG tablet Take 1 tablet (40 mg total) by mouth daily.   traZODone (DESYREL) 50 MG tablet TAKE 1 TABLET BY MOUTH AT BEDTIME AS NEEDED FOR SLEEP.     Allergies:   Patient has no known allergies.   Social History   Socioeconomic History   Marital status: Single    Spouse name: Not on file   Number of children: Not on file   Years of education: Not on file   Highest education level: Not on file  Occupational History   Not on file  Tobacco Use   Smoking status: Every Day    Packs/day: 0.50    Types: Cigarettes   Smokeless tobacco: Never  Vaping Use   Vaping Use: Never used  Substance and Sexual Activity   Alcohol use: No   Drug use: Never   Sexual activity: Not Currently  Other Topics Concern   Not on file  Social History Narrative   Not on file   Social Determinants of Health   Financial Resource Strain: Not on file  Food Insecurity: Not on file  Transportation Needs: Not on file  Physical Activity: Not on file  Stress: Not on file  Social Connections: Not on file     Family History: The patient's family history includes Bladder Cancer in his mother; Crohn's disease in his mother; Hypertension in his father; Irritable bowel syndrome in his sister.  ROS:   Please see the history of present illness.     All other systems reviewed and are negative.  EKGs/Labs/Other Studies Reviewed:    The following studies were reviewed today:   EKG:   04/30/2022: Normal sinus rhythm, rate 75, nonspecific T wave flattening  Recent Labs: 07/09/2022: ALT 47; BUN 17; Creatinine, Ser 1.29; Hemoglobin 16.5; Platelets 224; Potassium 5.0; Sodium 145  Recent Lipid Panel    Component Value Date/Time   CHOL 143 12/04/2021 1054   TRIG 126 12/04/2021 1054   HDL 39 (L) 12/04/2021 1054   CHOLHDL 3.7 12/04/2021 1054   CHOLHDL 7.7 (H) 04/08/2017 1427   VLDL 74 (H) 08/14/2016  1322   LDLCALC 81 12/04/2021 1054   [place]  04/08/2017 1427     Comment:     . LDL cholesterol not calculated. Triglyceride levels greater than 400 mg/dL invalidate calculated LDL results. . Reference range: <100 . Desirable range <100 mg/dL for primary prevention;   <70 mg/dL for patients with CHD or diabetic patients  with > or = 2 CHD risk factors. Marland Kitchen LDL-C is now calculated using the Martin-Hopkins  calculation, which is a validated novel method providing  better accuracy than the Friedewald equation  in the  estimation of LDL-C.  Cresenciano Genre et al. Annamaria Helling. [id number]): 2061-2068  (http://education.[employer].com/faq/FAQ164)     Physical Exam:    VS:  BP 122/84   Pulse 65   Ht 5' 8.5" (1.74 m)   Wt 215 lb 9.6 oz (97.8 kg)   SpO2 97%   BMI 32.31 kg/m     Wt Readings from Last 3 Encounters:  08/06/22 215 lb 9.6 oz (97.8 kg)  07/09/22 213 lb 9.6 oz (96.9 kg)  06/11/22 210 lb (95.3 kg)     GEN:  Well nourished, well developed in no acute distress HEENT: Normal NECK: No JVD; No carotid bruits LYMPHATICS: No lymphadenopathy CARDIAC: RRR, no murmurs, rubs, gallops RESPIRATORY:  Clear to auscultation without rales, wheezing or rhonchi  ABDOMEN: Soft, non-tender, non-distended MUSCULOSKELETAL:  No edema; No deformity  SKIN: Warm and dry NEUROLOGIC:  Alert and oriented x 3 PSYCHIATRIC:  Normal affect   ASSESSMENT:    1. Coronary artery disease involving native coronary artery of native heart without angina pectoris   2. Hyperlipidemia, unspecified hyperlipidemia type   3. Palpitations   4. PAD (peripheral artery disease) ([place])   5. Tobacco use     PLAN:    CAD: Status post stent placement in [place] in 2016.  Appears mid LAD from CT chest 03/2022.  He is reporting dyspnea on exertion.  Exercise Myoview on 05/07/2022 showed good exercise capacity (10.1 METS), EF 51%, normal perfusion.  Echocardiogram 06/25/2022 showed normal biventricular function, no significant  valvular disease.   -Continue aspirin 81 mg daily -Continue rosuvastatin 40 mg daily  Chronic systolic heart failure: EF as low as 30% in [place].  Echo 07/2015 showed EF improved to 60 to 65%.  He is reporting dyspnea on exertion.  Echocardiogram 06/25/2022 showed normal biventricular function, no significant valvular disease.    PAD: ABIs on 05/21/2022 were 0.64 right, 0.62 left.  Arterial duplex showed total occlusion in right SFA and posterior tibial arteries, total occlusion in the left SFA and anterior tibial arteries. -Referred to Dr. Gwenlyn Found.  Attempting medical management with Pletal.  If continues to be symptomatic, plan to proceed with endovascular therapy.  He reports symptoms have improved significantly since starting Pletal  Palpitations: Zio patch x 14 days on 05/29/2022 showed 10 episodes of SVT, longest lasting 17 beats; 6 patient triggered events, corresponding to sinus rhythm and 1 episode of ectopic atrial rhythm with rate 85 bpm.  Started on Toprol-XL 25 mg daily -Reports palpitations significantly improved since starting metoprolol, will continue  Hyperlipidemia: On rosuvastatin 20 mg daily and fenofibrate 160 mg daily, LDL 81 on 12/04/2021.  Goal LDL less than 55 given CAD history as above, increased rosuvastatin to 40 mg daily.  Check lipid panel  Tobacco use: Patient counseled on the risk of tobacco use and cessation strongly recommended.   RTC in 6 months    Medication Adjustments/Labs and Tests Ordered: Current medicines are reviewed at length with the patient today.  Concerns regarding medicines are outlined above.  Orders Placed This Encounter  Procedures   Lipid panel   No orders of the defined types were placed in this encounter.   Patient Instructions  Medication Instructions:  Your physician recommends that you continue on your current medications as directed. Please refer to the Current Medication list given to you today.  *If you need a refill on your  cardiac medications before your next appointment, please call your pharmacy*   Lab Work: Lipid today  If you have  labs (blood work) drawn today and your tests are completely normal, you will receive your results only by: [place] (if you have MyChart) OR A paper copy in the mail If you have any lab test that is abnormal or we need to change your treatment, we will call you to review the results.   Follow-Up: At [facility], you and your health needs are our priority.  As part of our continuing mission to provide you with exceptional heart care, we have created designated Provider Care Teams.  These Care Teams include your primary Cardiologist (physician) and Advanced Practice Providers (APPs -  Physician Assistants and Nurse Practitioners) who all work together to provide you with the care you need, when you need it.  We recommend signing up for the patient portal called "MyChart".  Sign up information is provided on this After Visit Summary.  MyChart is used to connect with patients for Virtual Visits (Telemedicine).  Patients are able to view lab/test results, encounter notes, upcoming appointments, etc.  Non-urgent messages can be sent to your provider as well.   To learn more about what you can do with MyChart, go to [email or web address].    Your next appointment:   6 month(s)  Provider:   Dr. Gardiner Rhyme    Signed, Donato Heinz, MD  08/06/2022 1:43 PM    [place]

## 2022-08-06 ENCOUNTER — Ambulatory Visit: Payer: BC Managed Care – PPO | Attending: Cardiology | Admitting: Cardiology

## 2022-08-06 ENCOUNTER — Encounter: Payer: Self-pay | Admitting: Cardiology

## 2022-08-06 VITALS — BP 122/84 | HR 65 | Ht 68.5 in | Wt 215.6 lb

## 2022-08-06 DIAGNOSIS — I739 Peripheral vascular disease, unspecified: Secondary | ICD-10-CM | POA: Diagnosis not present

## 2022-08-06 DIAGNOSIS — Z720 Tobacco use: Secondary | ICD-10-CM

## 2022-08-06 DIAGNOSIS — I2510 Atherosclerotic heart disease of native coronary artery without angina pectoris: Secondary | ICD-10-CM

## 2022-08-06 DIAGNOSIS — E785 Hyperlipidemia, unspecified: Secondary | ICD-10-CM

## 2022-08-06 DIAGNOSIS — R002 Palpitations: Secondary | ICD-10-CM

## 2022-08-06 NOTE — Patient Instructions (Signed)
Medication Instructions:  Your physician recommends that you continue on your current medications as directed. Please refer to the Current Medication list given to you today.  *If you need a refill on your cardiac medications before your next appointment, please call your pharmacy*   Lab Work: Lipid today  If you have labs (blood work) drawn today and your tests are completely normal, you will receive your results only by: [place] (if you have MyChart) OR A paper copy in the mail If you have any lab test that is abnormal or we need to change your treatment, we will call you to review the results.   Follow-Up: At [facility], you and your health needs are our priority.  As part of our continuing mission to provide you with exceptional heart care, we have created designated Provider Care Teams.  These Care Teams include your primary Cardiologist (physician) and Advanced Practice Providers (APPs -  Physician Assistants and Nurse Practitioners) who all work together to provide you with the care you need, when you need it.  We recommend signing up for the patient portal called "MyChart".  Sign up information is provided on this After Visit Summary.  MyChart is used to connect with patients for Virtual Visits (Telemedicine).  Patients are able to view lab/test results, encounter notes, upcoming appointments, etc.  Non-urgent messages can be sent to your provider as well.   To learn more about what you can do with MyChart, go to [email or web address].    Your next appointment:   6 month(s)  Provider:   Dr. Gardiner Rhyme

## 2022-08-07 LAB — LIPID PANEL
Chol/HDL Ratio: 4.5 ratio (ref 0.0–5.0)
Cholesterol, Total: 150 mg/dL (ref 100–199)
HDL: 33 mg/dL — ABNORMAL LOW (ref >39)
LDL Chol Calc (NIH): 87 mg/dL (ref 0–99)
Triglycerides: 174 mg/dL — ABNORMAL HIGH (ref 0–149)
VLDL Cholesterol Cal: 30 mg/dL (ref 5–40)

## 2022-08-08 ENCOUNTER — Other Ambulatory Visit: Payer: Self-pay | Admitting: *Deleted

## 2022-08-08 DIAGNOSIS — E785 Hyperlipidemia, unspecified: Secondary | ICD-10-CM

## 2022-08-08 MED ORDER — EZETIMIBE 10 MG PO TABS: 10.0000 mg | ORAL_TABLET | Freq: Every day | ORAL | 3 refills | Status: DC

## 2022-08-11 ENCOUNTER — Other Ambulatory Visit: Payer: Self-pay | Admitting: *Deleted

## 2022-08-11 DIAGNOSIS — E785 Hyperlipidemia, unspecified: Secondary | ICD-10-CM

## 2022-08-13 ENCOUNTER — Other Ambulatory Visit: Payer: Self-pay | Admitting: Nurse Practitioner

## 2022-08-13 MED ORDER — DICLOFENAC SODIUM 75 MG PO TBEC: 75.0000 mg | DELAYED_RELEASE_TABLET | Freq: Two times a day (BID) | ORAL | 2 refills | Status: DC | PRN

## 2022-09-03 ENCOUNTER — Other Ambulatory Visit: Payer: Self-pay | Admitting: Nurse Practitioner

## 2022-09-03 NOTE — Telephone Encounter (Signed)
Please advise kH

## 2022-09-09 ENCOUNTER — Other Ambulatory Visit: Payer: Self-pay | Admitting: Nurse Practitioner

## 2022-09-10 ENCOUNTER — Other Ambulatory Visit: Payer: Self-pay | Admitting: Nurse Practitioner

## 2022-09-10 MED ORDER — DICLOFENAC SODIUM 75 MG PO TBEC: 75.0000 mg | DELAYED_RELEASE_TABLET | Freq: Two times a day (BID) | ORAL | 2 refills | Status: DC | PRN

## 2022-10-08 ENCOUNTER — Encounter: Payer: Self-pay | Admitting: Cardiovascular Disease

## 2022-10-08 ENCOUNTER — Encounter: Payer: Self-pay | Admitting: Nurse Practitioner

## 2022-10-08 ENCOUNTER — Ambulatory Visit (INDEPENDENT_AMBULATORY_CARE_PROVIDER_SITE_OTHER): Payer: BC Managed Care – PPO | Admitting: Nurse Practitioner

## 2022-10-08 ENCOUNTER — Ambulatory Visit: Payer: BC Managed Care – PPO | Attending: Cardiovascular Disease | Admitting: Cardiovascular Disease

## 2022-10-08 VITALS — BP 118/78 | HR 80 | Temp 97.5°F | Wt 205.2 lb

## 2022-10-08 VITALS — BP 122/70 | HR 98 | Ht 68.0 in | Wt 205.2 lb

## 2022-10-08 DIAGNOSIS — F32A Depression, unspecified: Secondary | ICD-10-CM

## 2022-10-08 DIAGNOSIS — F419 Anxiety disorder, unspecified: Secondary | ICD-10-CM | POA: Diagnosis not present

## 2022-10-08 DIAGNOSIS — I739 Peripheral vascular disease, unspecified: Secondary | ICD-10-CM | POA: Diagnosis not present

## 2022-10-08 DIAGNOSIS — R809 Proteinuria, unspecified: Secondary | ICD-10-CM | POA: Diagnosis not present

## 2022-10-08 DIAGNOSIS — R7303 Prediabetes: Secondary | ICD-10-CM

## 2022-10-08 DIAGNOSIS — R300 Dysuria: Secondary | ICD-10-CM | POA: Diagnosis not present

## 2022-10-08 LAB — POCT GLYCOSYLATED HEMOGLOBIN (HGB A1C): Hemoglobin A1C: 6.1 % — AB (ref 4.0–5.6)

## 2022-10-08 LAB — POCT URINALYSIS DIP (PROADVANTAGE DEVICE)
Blood, UA: NEGATIVE
Glucose, UA: NEGATIVE mg/dL
Ketones, POC UA: NEGATIVE mg/dL
Leukocytes, UA: NEGATIVE
Nitrite, UA: NEGATIVE
Protein Ur, POC: 30 mg/dL — AB
Specific Gravity, Urine: 1.03
Urobilinogen, Ur: 1
pH, UA: 5.5

## 2022-10-08 MED ORDER — DOXYCYCLINE HYCLATE 100 MG PO TABS: 100.0000 mg | ORAL_TABLET | Freq: Two times a day (BID) | ORAL | 0 refills | Status: AC

## 2022-10-08 MED ORDER — CILOSTAZOL 100 MG PO TABS: 100.0000 mg | ORAL_TABLET | Freq: Two times a day (BID) | ORAL | 3 refills | Status: DC

## 2022-10-08 NOTE — Progress Notes (Signed)
  ID: Alex Charles, male    DOB: Feb 23, 1966, 57 y.o.   MRN: [id number]  Chief Complaint  Patient presents with   Depression    And anxiety   Follow-up    Prediabetes    Referring provider: Ivonne Andrew, NP   HPI  Prediabetes:  Hemoglobin A1c stable today at 6.1.  Patient is trying to watch diet.  Will order labs today:  - POCT glycosylated hemoglobin (Hb A1C) - CBC - Comprehensive metabolic panel  Dysuria: Proteinuria:  Patient has been having urinary symptoms including dark urine, odor, painful urination.  He has been having night sweats that he associates with this as well.  Urinalysis was positive for proteinuria and bilirubin.  We will refer patient to urology for further evaluation.  Will trial a round of doxycycline.  Advised patient to stay well-hydrated.  - POCT Urinalysis DIP (Proadvantage Device)  - Ambulatory referral to Urology   4. Anxiety and depression  Patient has been doing well with BuSpar and hydroxyzine.  He was following with Darl Pikes for counseling here in the office.  He does need to make a follow-up visit with her.  - will follow with Darl Pikes for counseling   Denies f/c/s, n/v/d, hemoptysis, PND, leg swelling Denies chest pain or edema    No Known Allergies  Immunization History  Administered Date(s) Administered   Influenza,inj,Quad PF,6+ Mos 04/02/2015, 02/17/2018, 02/13/2019   Moderna Sars-Covid-2 Vaccination 10/17/2019, 11/15/2019, 07/02/2020   Tdap 04/08/2017   Zoster Recombinat (Shingrix) 01/20/2019, 07/09/2022    Past Medical History:  Diagnosis Date   Alcoholism in recovery    happened 4 years ago/ went thru detox in the hospital   Allergy    seasonal   Anxiety    Blockage of coronary artery of heart    1 stent put in/ in 2015   CHF (congestive heart failure)    Depression    Hyperlipidemia    Prediabetes     Tobacco History: Social History   Tobacco Use  Smoking Status Every Day   Packs/day: .5    Types: Cigarettes  Smokeless Tobacco Never   Ready to quit: Not Answered Counseling given: Not Answered   Outpatient Encounter Medications as of 10/08/2022  Medication Sig   albuterol (VENTOLIN HFA) 108 (90 Base) MCG/ACT inhaler INHALE 2 PUFFS BY MOUTH EVERY 4 HOURS AS NEEDED FOR WHEEZE OR FOR SHORTNESS OF BREATH   aspirin EC 81 MG tablet Take 1 tablet (81 mg total) by mouth daily.   busPIRone (BUSPAR) 30 MG tablet TAKE 1 TABLET BY MOUTH 2 TIMES DAILY.   cetirizine (ZYRTEC) 10 MG tablet Take 1 tablet (10 mg total) by mouth daily.   cilostazol (PLETAL) 100 MG tablet Take 1 tablet (100 mg total) by mouth 2 (two) times daily.   diclofenac (VOLTAREN) 75 MG EC tablet Take 1 tablet (75 mg total) by mouth 2 (two) times daily as needed.   diclofenac Sodium (VOLTAREN) 1 % GEL Apply 4 g topically 4 (four) times daily. Apply to affected areas 4 times daily as needed for pain.   doxycycline (VIBRA-TABS) 100 MG tablet Take 1 tablet (100 mg total) by mouth 2 (two) times daily for 10 days.   ezetimibe (ZETIA) 10 MG tablet Take 1 tablet (10 mg total) by mouth daily.   fenofibrate 160 MG tablet TAKE 1 TABLET BY MOUTH EVERY DAY   hydrOXYzine (ATARAX) 10 MG tablet TAKE 1 TABLET BY MOUTH THREE TIMES A DAY AS NEEDED   metoprolol succinate (  TOPROL XL) 25 MG 24 hr tablet Take 1 tablet (25 mg total) by mouth daily.   rosuvastatin (CRESTOR) 40 MG tablet Take 1 tablet (40 mg total) by mouth daily.   traZODone (DESYREL) 50 MG tablet TAKE 1 TABLET BY MOUTH AT BEDTIME AS NEEDED FOR SLEEP.   nicotine polacrilex (NICORETTE) 2 MG gum Take 1 each (2 mg total) by mouth every 4 (four) hours while awake. (Patient not taking: Reported on 10/08/2022)   No facility-administered encounter medications on file as of 10/08/2022.     Review of Systems  Review of Systems  Constitutional: Negative.   HENT: Negative.    Cardiovascular: Negative.   Gastrointestinal: Negative.   Allergic/Immunologic: Negative.   Neurological:  Negative.   Psychiatric/Behavioral: Negative.         Physical Exam  BP 118/78   Pulse 80   Temp (!) 97.5 F (36.4 C)   Wt 205 lb 3.2 oz (93.1 kg)   SpO2 96%   BMI 31.20 kg/m   Wt Readings from Last 5 Encounters:  10/08/22 205 lb 3.2 oz (93.1 kg)  10/08/22 205 lb 3.2 oz (93.1 kg)  08/06/22 215 lb 9.6 oz (97.8 kg)  07/09/22 213 lb 9.6 oz (96.9 kg)  06/11/22 210 lb (95.3 kg)     Physical Exam Vitals and nursing note reviewed.  Constitutional:      General: He is not in acute distress.    Appearance: He is well-developed.  Cardiovascular:     Rate and Rhythm: Normal rate and regular rhythm.  Pulmonary:     Effort: Pulmonary effort is normal.     Breath sounds: Normal breath sounds.  Skin:    General: Skin is warm and dry.  Neurological:     Mental Status: He is alert and oriented to person, place, and time.      Lab Results:  CBC    Component Value Date/Time   WBC 8.5 07/09/2022 1032   WBC 9.4 04/08/2017 1427   RBC 5.28 07/09/2022 1032   RBC 5.58 04/08/2017 1427   HGB 16.5 07/09/2022 1032   HCT 50.4 07/09/2022 1032   PLT 224 07/09/2022 1032   MCV 96 07/09/2022 1032   MCH 31.3 07/09/2022 1032   MCH 31.7 04/08/2017 1427   MCHC 32.7 07/09/2022 1032   MCHC 34.4 04/08/2017 1427   RDW 13.7 07/09/2022 1032   LYMPHSABS 3.3 (H) 01/04/2020 1404   MONOABS 504 01/28/2016 1403   EOSABS 0.2 01/04/2020 1404   BASOSABS 0.1 01/04/2020 1404    BMET    Component Value Date/Time   NA 145 (H) 07/09/2022 1032   K 5.0 07/09/2022 1032   CL 106 07/09/2022 1032   CO2 18 (L) 07/09/2022 1032   GLUCOSE 87 07/09/2022 1032   GLUCOSE 128 (H) 08/14/2016 1310   BUN 17 07/09/2022 1032   CREATININE 1.29 (H) 07/09/2022 1032   CREATININE 0.97 08/14/2016 1310   CALCIUM 10.3 (H) 07/09/2022 1032   GFRNONAA 67 08/08/2020 1141   GFRNONAA 89 08/14/2016 1310   GFRAA 78 08/08/2020 1141   GFRAA >89 08/14/2016 1310    BNP No results found for: "BNP"  ProBNP No results found  for: "PROBNP"  Imaging: No results found.   Assessment & Plan:   Prediabetes - POCT glycosylated hemoglobin (Hb A1C) - CBC - Comprehensive metabolic panel  2. Dysuria  - POCT Urinalysis DIP (Proadvantage Device)  3. Proteinuria, unspecified type  - Ambulatory referral to Urology   4. Anxiety and depression  -  will follow with Darl Pikes for counseling  Follow up:  Follow up in 3 months     Ivonne Andrew, NP 10/08/2022

## 2022-10-08 NOTE — Patient Instructions (Signed)
Medication Instructions:  Your physician has recommended you make the following change in your medication:   -Increase cilostazol (pletal) to  twice daily.   *If you need a refill on your cardiac medications before your next appointment, please call your pharmacy*    Follow-Up: At [facility], you and your health needs are our priority.  As part of our continuing mission to provide you with exceptional heart care, we have created designated Provider Care Teams.  These Care Teams include your primary Cardiologist (physician) and Advanced Practice Providers (APPs -  Physician Assistants and Nurse Practitioners) who all work together to provide you with the care you need, when you need it.  We recommend signing up for the patient portal called "MyChart".  Sign up information is provided on this After Visit Summary.  MyChart is used to connect with patients for Virtual Visits (Telemedicine).  Patients are able to view lab/test results, encounter notes, upcoming appointments, etc.  Non-urgent messages can be sent to your provider as well.   To learn more about what you can do with MyChart, go to [email or web address].    Your next appointment:   3 month(s)  Provider:   Nanetta Batty, MD

## 2022-10-08 NOTE — Patient Instructions (Addendum)
1. Prediabetes  - POCT glycosylated hemoglobin (Hb A1C) - CBC - Comprehensive metabolic panel  2. Dysuria  - POCT Urinalysis DIP (Proadvantage Device)  3. Proteinuria, unspecified type  - Ambulatory referral to Urology   4. Anxiety and depression  - will follow with Darl Pikes for counseling  Follow up:  Follow up in 3 months

## 2022-10-08 NOTE — Assessment & Plan Note (Signed)
-   POCT glycosylated hemoglobin (Hb A1C) - CBC - Comprehensive metabolic panel  2. Dysuria  - POCT Urinalysis DIP (Proadvantage Device)  3. Proteinuria, unspecified type  - Ambulatory referral to Urology   4. Anxiety and depression  - will follow with Darl Pikes for counseling  Follow up:  Follow up in 3 months

## 2022-10-08 NOTE — Progress Notes (Signed)
Alex Charles returns today for follow-up of his PAD.  He was initially referred to me by his cardiologist, Dr. Bjorn Pippin.  His Doppler showed bilateral SFA occlusion.  I began him on Pletal 50 mg p.o. twice daily which afforded him moderate improvement in his symptoms.  We talked about strategies moving forward including pharmacologic or invasive.  We have opted for increasing his Pletal dose to 100 g p.o. twice daily and reassessing in 3 months.  Runell Gess, M.D., FACP, [facility], [facility] [address] [place], [place]  [address]  [phone] 10/08/2022 9:42 AM

## 2022-10-09 LAB — COMPREHENSIVE METABOLIC PANEL
ALT: 56 IU/L — ABNORMAL HIGH (ref 0–44)
AST: 48 IU/L — ABNORMAL HIGH (ref 0–40)
Albumin/Globulin Ratio: 2 (ref 1.2–2.2)
Albumin: 4.8 g/dL (ref 3.8–4.9)
Alkaline Phosphatase: 65 IU/L (ref 44–121)
BUN/Creatinine Ratio: 21 — ABNORMAL HIGH (ref 9–20)
BUN: 23 mg/dL (ref 6–24)
Bilirubin Total: 0.4 mg/dL (ref 0.0–1.2)
CO2: 21 mmol/L (ref 20–29)
Calcium: 9.8 mg/dL (ref 8.7–10.2)
Chloride: 102 mmol/L (ref 96–106)
Creatinine, Ser: 1.12 mg/dL (ref 0.76–1.27)
Globulin, Total: 2.4 g/dL (ref 1.5–4.5)
Glucose: 82 mg/dL (ref 70–99)
Potassium: 4.6 mmol/L (ref 3.5–5.2)
Sodium: 141 mmol/L (ref 134–144)
Total Protein: 7.2 g/dL (ref 6.0–8.5)
eGFR: 76 mL/min/{1.73_m2} (ref >59)

## 2022-10-09 LAB — CBC
Hematocrit: 50.5 % (ref 37.5–51.0)
Hemoglobin: 16.9 g/dL (ref 13.0–17.7)
MCH: 31.6 pg (ref 26.6–33.0)
MCHC: 33.5 g/dL (ref 31.5–35.7)
MCV: 94 fL (ref 79–97)
Platelets: 205 10*3/uL (ref 150–450)
RBC: 5.35 x10E6/uL (ref 4.14–5.80)
RDW: 13.6 % (ref 11.6–15.4)
WBC: 6.8 10*3/uL (ref 3.4–10.8)

## 2022-10-29 ENCOUNTER — Ambulatory Visit (INDEPENDENT_AMBULATORY_CARE_PROVIDER_SITE_OTHER): Payer: Self-pay | Admitting: Clinical

## 2022-10-29 DIAGNOSIS — F3289 Other specified depressive episodes: Secondary | ICD-10-CM

## 2022-10-29 DIAGNOSIS — F419 Anxiety disorder, unspecified: Secondary | ICD-10-CM

## 2022-10-29 NOTE — BH Specialist Note (Signed)
Integrated Behavioral Health Follow Up In-Person Visit  MRN: [id number] Name: Alex Charles  Number of Integrated Behavioral Health Clinician visits: 4- Fourth Visit  Session Start time: 1000   Session End time: 1100  Total time in minutes: 60   Types of Service: Individual psychotherapy  Interpretor:No. Interpretor Name and Language: none  Subjective: [name] is a 57 y.o. male  Patient was referred by Angus Seller, NP for depression. Patient reports the following symptoms/concerns: feeling down, socially isolated, depression, recovery from alcohol abuse. Duration of problem: several years; Severity of problem: severe  Objective: Mood: Euthymic and Affect: Appropriate Risk of harm to self or others: No plan to harm self or others  Life Context: Family and Social: lives independently School/Work: works full time Self-Care:  Life Changes:   Patient and/or Family's Strengths/Protective Factors: Social connections, Social and Patent attorney, Concrete supports in place (healthy food, safe environments, etc.), and Sense of purpose   Goals Addressed: Patient will: Reduce symptoms of: anxiety and depression Increase knowledge and/or ability of: coping skills and self-management skills  Demonstrate ability to: Increase healthy adjustment to current life circumstances  Progress towards Goals: Ongoing  Interventions: Interventions utilized:  CBT Cognitive Behavioral Therapy and Supportive Counseling Standardized Assessments completed: Not Needed (PHQ completed at last PCP visit)     10/08/2022   10:29 AM 07/09/2022    9:43 AM 03/06/2022   10:03 AM 12/04/2021    9:38 AM 08/28/2021    9:07 AM  Depression screen PHQ 2/9  Decreased Interest 3 0 3 3 0  Down, Depressed, Hopeless 3 0 2 3 1   PHQ - 2 Score 6 0 5 6 1   Altered sleeping 0  2 3 0  Tired, decreased energy 0  1 3 3   Change in appetite 2  3 3 3   Feeling bad or failure about yourself  3  1 3  0  Trouble  concentrating 3  1 2 1   Moving slowly or fidgety/restless 0  0 2 0  Suicidal thoughts 1  0 3 1  PHQ-9 Score 15  13 25 9   Difficult doing work/chores Not difficult at all   Extremely dIfficult     Patient continues to experience depression and anxiety. He describes feeling a "sense of doom." Patient works full time, though does not have much social connection outside of work. He experiences negative thoughts about self in context of perceived mistakes or shortcomings at work. CBT today to explore negative beliefs about self and re-frame. Explored how negative beliefs may be influencing patient's behaviors in that he has not prioritized social interaction in the way that he used to.  Patient has looked into some social groups that interest him in the area, but has not been able to meet up with them or join due to his work schedule. Discussed the possibility of requesting a change to his schedule at work so he can make more time for social activities.  Patient and/or Family Response: Patient engaged in session.   Assessment: Patient currently experiencing depression and anxiety exacerbated by relative social isolation. Patient is also in recovery from alcohol abuse.   Patient may benefit from CBT to explore thoughts about self and others in context of challenges with social isolation, as well as to process emotions related to this.  Plan: Follow up with behavioral health clinician on: 11/19/22 Behavioral recommendations: consider shift in work schedule and increase social activities  Abigail Butts, LCSW

## 2022-11-19 ENCOUNTER — Ambulatory Visit: Payer: Self-pay | Admitting: Clinical

## 2022-11-19 DIAGNOSIS — R8279 Other abnormal findings on microbiological examination of urine: Secondary | ICD-10-CM | POA: Diagnosis not present

## 2022-11-19 DIAGNOSIS — R800 Isolated proteinuria: Secondary | ICD-10-CM | POA: Diagnosis not present

## 2022-11-19 DIAGNOSIS — R3912 Poor urinary stream: Secondary | ICD-10-CM | POA: Diagnosis not present

## 2022-11-29 ENCOUNTER — Other Ambulatory Visit: Payer: Self-pay | Admitting: Nurse Practitioner

## 2022-11-29 DIAGNOSIS — G4700 Insomnia, unspecified: Secondary | ICD-10-CM

## 2022-12-10 ENCOUNTER — Ambulatory Visit (INDEPENDENT_AMBULATORY_CARE_PROVIDER_SITE_OTHER): Payer: Self-pay | Admitting: Clinical

## 2022-12-10 DIAGNOSIS — F3289 Other specified depressive episodes: Secondary | ICD-10-CM

## 2022-12-10 DIAGNOSIS — F419 Anxiety disorder, unspecified: Secondary | ICD-10-CM

## 2022-12-10 NOTE — BH Specialist Note (Unsigned)
The daily stoic - four values - courage, discipline, etc People pleaser  Reading rec - no bad parts?

## 2022-12-24 ENCOUNTER — Ambulatory Visit (INDEPENDENT_AMBULATORY_CARE_PROVIDER_SITE_OTHER): Payer: Self-pay | Admitting: Clinical

## 2022-12-24 DIAGNOSIS — F419 Anxiety disorder, unspecified: Secondary | ICD-10-CM

## 2022-12-24 DIAGNOSIS — F3289 Other specified depressive episodes: Secondary | ICD-10-CM

## 2022-12-24 NOTE — BH Specialist Note (Signed)
Integrated Behavioral Health Follow Up In-Person Visit  MRN: [id number] Name: Alex Charles  Number of Integrated Behavioral Health Clinician visits: 6-Sixth Visit  Session Start time: 1000   Session End time: 1100  Total time in minutes: 60   Types of Service: Individual psychotherapy  Interpretor:No. Interpretor Name and Language: none  Subjective: Majer Bansal is a 57 y.o. male  Patient was referred by Angus Seller, NP for depression. Patient reports the following symptoms/concerns: feeling down, socially isolated, depression, recovery from alcohol abuse. Duration of problem: several years; Severity of problem: severe  Objective: Mood: Euthymic and Affect: Appropriate Risk of harm to self or others: No plan to harm self or others  Patient and/or Family's Strengths/Protective Factors: Social connections, Social and Emotional competence, Concrete supports in place (healthy food, safe environments, etc.), and Sense of purpose   Goals Addressed: Patient will:  Reduce symptoms of: anxiety and depression Increase knowledge and/or ability of: coping skills and self-management skills  Demonstrate ability to: Increase healthy adjustment to current life circumstances  Progress towards Goals: Ongoing  Interventions: Interventions utilized:  Supportive Counseling and Communication Skills Standardized Assessments completed: Not Needed  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 Explored thoughts and emotions about recent interactions. Discussed different types of communication and reflected on what communication strategies people around him are likely using and what communication strategies he would like to use. Emotional validation and reflective listening provided. Patient also reflected on how his family dynamics have influenced his current strategies for interacting with  others.  Patient and/or Family Response: Patient engaged in session.   Assessment: Patient currently experiencing depression and anxiety exacerbated by work stress and relative social isolation. Patient is also in recovery from substance use disorder.   Patient may benefit from CBT to explore thoughts about self and others in context of challenges with social isolation, as well as to process emotions related to this.  Plan: Follow up with behavioral health clinician on: 01/07/23 Behavioral recommendations: practice assertive communication  Abigail Butts, LCSW

## 2022-12-31 ENCOUNTER — Other Ambulatory Visit: Payer: Self-pay | Admitting: Nurse Practitioner

## 2023-01-02 ENCOUNTER — Other Ambulatory Visit: Payer: Self-pay | Admitting: Nurse Practitioner

## 2023-01-02 ENCOUNTER — Other Ambulatory Visit: Payer: Self-pay

## 2023-01-02 ENCOUNTER — Telehealth: Payer: Self-pay

## 2023-01-02 DIAGNOSIS — F419 Anxiety disorder, unspecified: Secondary | ICD-10-CM

## 2023-01-02 MED ORDER — HYDROXYZINE HCL 10 MG PO TABS: 10.0000 mg | ORAL_TABLET | Freq: Three times a day (TID) | ORAL | 2 refills | Status: DC | PRN

## 2023-01-02 MED ORDER — BUSPIRONE HCL 30 MG PO TABS: 30.0000 mg | ORAL_TABLET | Freq: Two times a day (BID) | ORAL | 1 refills | Status: DC

## 2023-01-06 NOTE — Telephone Encounter (Signed)
Per provider medication sent in. Gh

## 2023-01-07 ENCOUNTER — Ambulatory Visit (INDEPENDENT_AMBULATORY_CARE_PROVIDER_SITE_OTHER): Payer: Self-pay | Admitting: Clinical

## 2023-01-07 DIAGNOSIS — F3289 Other specified depressive episodes: Secondary | ICD-10-CM

## 2023-01-07 DIAGNOSIS — F419 Anxiety disorder, unspecified: Secondary | ICD-10-CM

## 2023-01-07 NOTE — BH Specialist Note (Signed)
Integrated Behavioral Health Follow Up In-Person Visit  MRN: [id number] Name: Alex Charles  Number of Integrated Behavioral Health Clinician visits: Additional Visit  Session Start time: 1000   Session End time: 1058  Total time in minutes: 58   Types of Service: Individual psychotherapy  Interpretor:No. Interpretor Name and Language: none  Subjective: Edinson Domeier is a 57 y.o. male  Patient was referred by Angus Seller, NP for depression. Patient reports the following symptoms/concerns: socially isolated, depression, long term recovery from alcohol abuse Duration of problem: several years; Severity of problem: severe  Objective: Mood: Euthymic and Affect: Appropriate Risk of harm to self or others: No plan to harm self or others  Patient and/or Family's Strengths/Protective Factors: Social connections, Social and Emotional competence, Concrete supports in place (healthy food, safe environments, etc.), and Sense of purpose   Goals Addressed: Patient will: Reduce symptoms of: anxiety and depression Increase knowledge and/or ability of: coping skills and self-management skills  Demonstrate ability to: Increase healthy adjustment to current life circumstances  Progress towards Goals: Ongoing  Interventions: Interventions utilized:  Supportive Counseling Standardized Assessments completed: Not Needed  Supportive counseling today. Patient continues to navigate interpersonal dynamics in his work environment. 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 Discussed assertive communication with his managers. Patient feels anxiety around navigating this challenge, and has negative thoughts about self in this context; thinking that he isn't being disciplined enough to make changes. Emotional validation provided and invited patient to consider more balanced view of self.   Patient and/or Family Response: Patient engaged in session.    Assessment: Patient currently experiencing depression and anxiety exacerbated by work stress and relative social isolation. Patient is also in recovery from substance use disorder.   Patient may benefit from CBT to explore thoughts about self and others in context of challenges with social isolation, as well as to process emotions related to this.  Plan: Follow up with behavioral health clinician on: 01/21/23  Abigail Butts, LCSW

## 2023-01-14 ENCOUNTER — Ambulatory Visit (INDEPENDENT_AMBULATORY_CARE_PROVIDER_SITE_OTHER): Payer: BC Managed Care – PPO | Admitting: Nurse Practitioner

## 2023-01-14 ENCOUNTER — Encounter: Payer: Self-pay | Admitting: Nurse Practitioner

## 2023-01-14 VITALS — BP 110/81 | HR 73 | Temp 97.1°F | Wt 206.0 lb

## 2023-01-14 DIAGNOSIS — Z1283 Encounter for screening for malignant neoplasm of skin: Secondary | ICD-10-CM

## 2023-01-14 DIAGNOSIS — R7303 Prediabetes: Secondary | ICD-10-CM

## 2023-01-14 LAB — POCT GLYCOSYLATED HEMOGLOBIN (HGB A1C): Hemoglobin A1C: 6 % — AB (ref 4.0–5.6)

## 2023-01-14 NOTE — Patient Instructions (Addendum)
1. Prediabetes  - POCT glycosylated hemoglobin (Hb A1C) - CBC - Comprehensive metabolic panel  2. Anxiety  Continue current medications  Continue counseling with Darl Pikes    Follow up:  Follow up in 6 months

## 2023-01-14 NOTE — Progress Notes (Signed)
@Patient  ID: Alex Charles, male    DOB: 11/23/1965, 57 y.o.   MRN: [id number]  Chief Complaint  Patient presents with   Anxiety   abnormal a1c    Referring provider: Ivonne Andrew, NP   HPI  Prediabetes:   Hemoglobin A1c stable today at 6.0.  Patient is trying to watch diet.  Will order labs today:   - POCT glycosylated hemoglobin (Hb A1C) - CBC - Comprehensive metabolic panel       Anxiety and depression   Patient has been doing well with BuSpar and hydroxyzine.  He was following with Darl Pikes for counseling here in the office.  He is also getting counseling through work. States that he is ding much better.      Denies f/c/s, n/v/d, hemoptysis, PND, leg swelling Denies chest pain or edema    No Known Allergies  Immunization History  Administered Date(s) Administered   Influenza,inj,Quad PF,6+ Mos 04/02/2015, 02/17/2018, 02/13/2019   Moderna Sars-Covid-2 Vaccination 10/17/2019, 11/15/2019, 07/02/2020   Tdap 04/08/2017   Zoster Recombinant(Shingrix) 01/20/2019, 07/09/2022    Past Medical History:  Diagnosis Date   Alcoholism in recovery (HCC)    happened 4 years ago/ went thru detox in the hospital   Allergy    seasonal   Anxiety    Blockage of coronary artery of heart (HCC)    1 stent put in/ in 2015   CHF (congestive heart failure) (HCC)    Depression    Hyperlipidemia    Prediabetes     Tobacco History: Social History   Tobacco Use  Smoking Status Every Day   Current packs/day: 0.50   Types: Cigarettes  Smokeless Tobacco Never   Ready to quit: Not Answered Counseling given: Not Answered   Outpatient Encounter Medications as of 01/14/2023  Medication Sig   albuterol (VENTOLIN HFA) 108 (90 Base) MCG/ACT inhaler INHALE 2 PUFFS BY MOUTH EVERY 4 HOURS AS NEEDED FOR WHEEZE OR FOR SHORTNESS OF BREATH   aspirin EC 81 MG tablet Take 1 tablet (81 mg total) by mouth daily.   busPIRone (BUSPAR) 30 MG tablet Take 1 tablet (30 mg total) by mouth 2  (two) times daily.   cetirizine (ZYRTEC) 10 MG tablet Take 1 tablet (10 mg total) by mouth daily.   cilostazol (PLETAL) 100 MG tablet Take 1 tablet (100 mg total) by mouth 2 (two) times daily.   diclofenac (VOLTAREN) 75 MG EC tablet Take 1 tablet (75 mg total) by mouth 2 (two) times daily as needed.   diclofenac Sodium (VOLTAREN) 1 % GEL Apply 4 g topically 4 (four) times daily. Apply to affected areas 4 times daily as needed for pain.   ezetimibe (ZETIA) 10 MG tablet Take 1 tablet (10 mg total) by mouth daily.   fenofibrate 160 MG tablet TAKE 1 TABLET BY MOUTH EVERY DAY   hydrOXYzine (ATARAX) 10 MG tablet Take 1 tablet (10 mg total) by mouth 3 (three) times daily as needed.   metoprolol succinate (TOPROL XL) 25 MG 24 hr tablet Take 1 tablet (25 mg total) by mouth daily.   rosuvastatin (CRESTOR) 40 MG tablet Take 1 tablet (40 mg total) by mouth daily.   traZODone (DESYREL) 50 MG tablet TAKE 1 TABLET BY MOUTH EVERY DAY AT BEDTIME AS NEEDED FOR SLEEP   nicotine polacrilex (NICORETTE) 2 MG gum Take 1 each (2 mg total) by mouth every 4 (four) hours while awake. (Patient not taking: Reported on 10/08/2022)   No facility-administered encounter medications on file as  of 01/14/2023.     Review of Systems  Review of Systems  Constitutional: Negative.   HENT: Negative.    Cardiovascular: Negative.   Gastrointestinal: Negative.   Allergic/Immunologic: Negative.   Neurological: Negative.   Psychiatric/Behavioral: Negative.         Physical Exam  BP 110/81   Pulse 73   Temp (!) 97.1 F (36.2 C)   Wt 206 lb (93.4 kg)   SpO2 98%   BMI 31.32 kg/m   Wt Readings from Last 5 Encounters:  01/14/23 206 lb (93.4 kg)  10/08/22 205 lb 3.2 oz (93.1 kg)  10/08/22 205 lb 3.2 oz (93.1 kg)  08/06/22 215 lb 9.6 oz (97.8 kg)  07/09/22 213 lb 9.6 oz (96.9 kg)     Physical Exam Vitals and nursing note reviewed.  Constitutional:      General: He is not in acute distress.    Appearance: He is  well-developed.  Cardiovascular:     Rate and Rhythm: Normal rate and regular rhythm.  Pulmonary:     Effort: Pulmonary effort is normal.     Breath sounds: Normal breath sounds.  Skin:    General: Skin is warm and dry.  Neurological:     Mental Status: He is alert and oriented to person, place, and time.      Lab Results:  CBC    Component Value Date/Time   WBC 6.8 10/08/2022 1058   WBC 9.4 04/08/2017 1427   RBC 5.35 10/08/2022 1058   RBC 5.58 04/08/2017 1427   HGB 16.9 10/08/2022 1058   HCT 50.5 10/08/2022 1058   PLT 205 10/08/2022 1058   MCV 94 10/08/2022 1058   MCH 31.6 10/08/2022 1058   MCH 31.7 04/08/2017 1427   MCHC 33.5 10/08/2022 1058   MCHC 34.4 04/08/2017 1427   RDW 13.6 10/08/2022 1058   LYMPHSABS 3.3 (H) 01/04/2020 1404   MONOABS 504 01/28/2016 1403   EOSABS 0.2 01/04/2020 1404   BASOSABS 0.1 01/04/2020 1404    BMET    Component Value Date/Time   NA 141 10/08/2022 1058   K 4.6 10/08/2022 1058   CL 102 10/08/2022 1058   CO2 21 10/08/2022 1058   GLUCOSE 82 10/08/2022 1058   GLUCOSE 128 (H) 08/14/2016 1310   BUN 23 10/08/2022 1058   CREATININE 1.12 10/08/2022 1058   CREATININE 0.97 08/14/2016 1310   CALCIUM 9.8 10/08/2022 1058   GFRNONAA 67 08/08/2020 1141   GFRNONAA 89 08/14/2016 1310   GFRAA 78 08/08/2020 1141   GFRAA >89 08/14/2016 1310      Assessment & Plan:   Prediabetes - POCT glycosylated hemoglobin (Hb A1C) - CBC - Comprehensive metabolic panel  2. Anxiety  Continue current medications  Continue counseling with Darl Pikes    Follow up:  Follow up in 6 months     Ivonne Andrew, NP 01/14/2023

## 2023-01-14 NOTE — Assessment & Plan Note (Signed)
-   POCT glycosylated hemoglobin (Hb A1C) - CBC - Comprehensive metabolic panel  2. Anxiety  Continue current medications  Continue counseling with Darl Pikes    Follow up:  Follow up in 6 months

## 2023-01-15 LAB — COMPREHENSIVE METABOLIC PANEL
AST: 34 IU/L (ref 0–40)
Alkaline Phosphatase: 59 IU/L (ref 44–121)
BUN/Creatinine Ratio: 15 (ref 9–20)
BUN: 16 mg/dL (ref 6–24)
Bilirubin Total: 0.3 mg/dL (ref 0.0–1.2)
CO2: 22 mmol/L (ref 20–29)
Calcium: 9.8 mg/dL (ref 8.7–10.2)
Chloride: 105 mmol/L (ref 96–106)
Creatinine, Ser: 1.09 mg/dL (ref 0.76–1.27)
Globulin, Total: 2.3 g/dL (ref 1.5–4.5)
Glucose: 94 mg/dL (ref 70–99)
Sodium: 141 mmol/L (ref 134–144)
eGFR: 79 mL/min/{1.73_m2} (ref >59)

## 2023-01-15 LAB — CBC
Hematocrit: 48.1 % (ref 37.5–51.0)
MCH: 31.9 pg (ref 26.6–33.0)
MCHC: 33.7 g/dL (ref 31.5–35.7)
MCV: 95 fL (ref 79–97)
Platelets: 253 10*3/uL (ref 150–450)
RBC: 5.08 x10E6/uL (ref 4.14–5.80)
RDW: 13.2 % (ref 11.6–15.4)
WBC: 10.3 10*3/uL (ref 3.4–10.8)

## 2023-01-19 ENCOUNTER — Other Ambulatory Visit: Payer: Self-pay | Admitting: Nurse Practitioner

## 2023-01-19 DIAGNOSIS — J302 Other seasonal allergic rhinitis: Secondary | ICD-10-CM

## 2023-01-21 ENCOUNTER — Other Ambulatory Visit: Payer: Self-pay | Admitting: Nurse Practitioner

## 2023-01-21 ENCOUNTER — Ambulatory Visit: Payer: BC Managed Care – PPO | Attending: Cardiovascular Disease | Admitting: Cardiovascular Disease

## 2023-01-21 ENCOUNTER — Ambulatory Visit (INDEPENDENT_AMBULATORY_CARE_PROVIDER_SITE_OTHER): Payer: Self-pay | Admitting: Clinical

## 2023-01-21 ENCOUNTER — Encounter: Payer: Self-pay | Admitting: Cardiovascular Disease

## 2023-01-21 VITALS — BP 118/82 | HR 69 | Ht 68.5 in | Wt 209.0 lb

## 2023-01-21 DIAGNOSIS — I739 Peripheral vascular disease, unspecified: Secondary | ICD-10-CM

## 2023-01-21 DIAGNOSIS — F3289 Other specified depressive episodes: Secondary | ICD-10-CM

## 2023-01-21 DIAGNOSIS — R002 Palpitations: Secondary | ICD-10-CM

## 2023-01-21 DIAGNOSIS — F419 Anxiety disorder, unspecified: Secondary | ICD-10-CM

## 2023-01-21 NOTE — Assessment & Plan Note (Signed)
Mr. Surprenant returns today for follow-up of PAD.  I initially saw him approximately 7 months ago at the request of Dr. Bjorn Pippin for lifestyle-limiting claudication.Marland Kitchen  His Doppler showed occluded SFAs bilaterally.  I began him on Pletal which I uptitrated to max dose which has afforded him some but not complete relief.  He still has lifestyle-limiting symptoms that are affecting him at work and wishes to proceed with peripheral angiography and endovascular therapy.

## 2023-01-21 NOTE — Patient Instructions (Signed)
Medication Instructions:  Your physician has recommended you make the following change in your medication:   -Stop taking cilostazol (pletal).   *If you need a refill on your cardiac medications before your next appointment, please call your pharmacy*   Lab Work: Your physician recommends that you return for lab work in: after 8/9 for BMET & CBC   If you have labs (blood work) drawn today and your tests are completely normal, you will receive your results only by: MyChart Message (if you have MyChart) OR A paper copy in the mail If you have any lab test that is abnormal or we need to change your treatment, we will call you to review the results.   Testing/Procedures: Your physician has requested that you have a lower extremity arterial duplex. During this test, ultrasound is used to evaluate arterial blood flow in the legs. Allow one hour for this exam. There are no restrictions or special instructions. This will take place at [address] [facility] [address], [address]. To be done 1-2 weeks after your procedure (9/9).  Your physician has requested that you have an ankle brachial index (ABI). During this test an ultrasound and blood pressure cuff are used to evaluate the arteries that supply the arms and legs with blood. Allow thirty minutes for this exam. There are no restrictions or special instructions. This will take place at [address] [facility], [address]. To be done 1-2 weeks after your procedure (9/9).     Follow-Up: At [facility], you and your health needs are our priority.  As part of our continuing mission to provide you with exceptional heart care, we have created designated Provider Care Teams.  These Care Teams include your primary Cardiologist (physician) and Advanced Practice Providers (APPs -  Physician Assistants and Nurse Practitioners) who all work together to provide you with the care you need, when you need it.  We recommend signing up for the patient portal called  "MyChart".  Sign up information is provided on this After Visit Summary.  MyChart is used to connect with patients for Virtual Visits (Telemedicine).  Patients are able to view lab/test results, encounter notes, upcoming appointments, etc.  Non-urgent messages can be sent to your provider as well.   To learn more about what you can do with MyChart, go to [email or web address].    Your next appointment:   2-3 week(s) after your procedure (9/9)  Provider:   Nanetta Batty, MD   Other Instructions       Cardiac/Peripheral Catheterization   You are scheduled for a Peripheral Angiogram on Monday, September 9 with Dr. Nanetta Batty.  1. Please arrive at the [facility] (Main Entrance A) at [facility]: [address] [place], [place] [address] at 10:00 AM (This time is 2 hour(s) before your procedure to ensure your preparation). Free valet parking service is available. You will check in at ADMITTING. The support person will be asked to wait in the waiting room.  It is OK to have someone drop you off and come back when you are ready to be discharged.        Special note: Every effort is made to have your procedure done on time. Please understand that emergencies sometimes delay scheduled procedures.  2. Diet: Do not eat solid foods after midnight.  You may have clear liquids until 5 AM the day of the procedure.  3. Labs: You will need to have blood drawn after 8/9 for BMET & CBC   4.  Medication instructions in preparation for your procedure:    On the morning of your procedure, take Aspirin 81 mg and any morning medicines NOT listed above.  You may use sips of water.  5. Plan to go home the same day, you will only stay overnight if medically necessary. 6. You MUST have a responsible adult to drive you home. 7. An adult MUST be with you the first 24 hours after you arrive home. 8. Bring a current list of your medications, and the last time and date medication taken. 9. Bring  ID and current insurance cards. 10.Please wear clothes that are easy to get on and off and wear slip-on shoes.  Thank you for allowing [place] to care for you!   -- [facility]

## 2023-01-21 NOTE — Progress Notes (Signed)
01/21/2023 Alex Charles   Nov 03, 1965  [id number]  Primary Physician Ivonne Andrew, NP Primary Cardiologist: [name] [name], [place]  HPI:  Alex Charles is a 57 y.o.   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  He was referred by his primary cardiologist, Dr. Bjorn Pippin , for peripheral vascular evaluation and treatment.  I last saw him in the office 10/08/2022.  His cardiac risk factor profile is notable for ongoing tobacco abuse having smoked 40 pack years now smoking 8 cigarettes a day.  History of hypertension hyperlipidemia.  There is no family history for heart disease.  He has had a stent in his heart in [facility] April 2016.  Recent Myoview stress test was nonischemic.  He denies chest pain or shortness of breath.  He does have symmetric claudication of the last 5 to 7 years which is lifestyle limiting.  Recent Dopplers performed 05/21/2022 revealed ABIs in the 0.6 range with occluded SFAs bilaterally.   I began him on Pletal 50 mg a day and uptitrated to 100 mg twice daily.  He continues to have lifestyle-limiting claudication which is affecting him at work.  He wishes to proceed with an endovascular approach.  We did talk about lifestyle modification and he has agreed to stop smoking.    Current Meds  Medication Sig   albuterol (VENTOLIN HFA) 108 (90 Base) MCG/ACT inhaler INHALE 2 PUFFS BY MOUTH EVERY 4 HOURS AS NEEDED FOR WHEEZE OR FOR SHORTNESS OF BREATH   aspirin EC 81 MG tablet Take 1 tablet (81 mg total) by mouth daily.   busPIRone (BUSPAR) 30 MG tablet Take 1 tablet (30 mg total) by mouth 2 (two) times daily.   cetirizine (ZYRTEC) 10 MG tablet TAKE 1 TABLET BY MOUTH EVERY DAY   cilostazol (PLETAL) 100 MG tablet Take 1 tablet (100 mg total) by mouth 2 (two) times daily.   diclofenac (VOLTAREN) 75 MG EC tablet TAKE 1 TABLET BY MOUTH 2 TIMES DAILY AS NEEDED.    diclofenac Sodium (VOLTAREN) 1 % GEL Apply 4 g topically 4 (four) times daily. Apply to affected areas 4 times daily as needed for pain.   ezetimibe (ZETIA) 10 MG tablet Take 1 tablet (10 mg total) by mouth daily.   fenofibrate 160 MG tablet TAKE 1 TABLET BY MOUTH EVERY DAY   hydrOXYzine (ATARAX) 10 MG tablet Take 1 tablet (10 mg total) by mouth 3 (three) times daily as needed.   metoprolol succinate (TOPROL XL) 25 MG 24 hr tablet Take 1 tablet (25 mg total) by mouth daily.   nicotine polacrilex (NICORETTE) 2 MG gum Take 1 each (2 mg total) by mouth every 4 (four) hours while awake.   rosuvastatin (CRESTOR) 40 MG tablet Take 1 tablet (40 mg total) by mouth daily.   traZODone (DESYREL) 50 MG tablet TAKE 1 TABLET BY MOUTH EVERY DAY AT BEDTIME AS NEEDED FOR SLEEP     No Known Allergies  Social History   Socioeconomic History   Marital status: Single    Spouse name: Not on file   Number of children: Not on file   Years of education: Not on file   Highest education level: Not on file  Occupational History   Not on file  Tobacco Use   Smoking status: Every Day    Current packs/day: 0.50    Types: Cigarettes   Smokeless tobacco:  Never  Vaping Use   Vaping status: Never Used  Substance and Sexual Activity   Alcohol use: No   Drug use: Never   Sexual activity: Not Currently  Other Topics Concern   Not on file  Social History Narrative   Not on file   Social Determinants of Health   Financial Resource Strain: Not on file  Food Insecurity: Not on file  Transportation Needs: Not on file  Physical Activity: Not on file  Stress: Not on file  Social Connections: Not on file  Intimate Partner Violence: Not on file     Review of Systems: General: negative for chills, fever, night sweats or weight changes.  Cardiovascular: negative for chest pain, dyspnea on exertion, edema, orthopnea, palpitations, paroxysmal nocturnal dyspnea or shortness of breath Dermatological: negative for  rash Respiratory: negative for cough or wheezing Urologic: negative for hematuria Abdominal: negative for nausea, vomiting, diarrhea, bright red blood per rectum, melena, or hematemesis Neurologic: negative for visual changes, syncope, or dizziness All other systems reviewed and are otherwise negative except as noted above.    Blood pressure 118/82, pulse 69, height 5' 8.5" (1.74 m), weight 209 lb (94.8 kg), SpO2 94%.  General appearance: alert and no distress Neck: no adenopathy, no carotid bruit, no JVD, supple, symmetrical, trachea midline, and thyroid not enlarged, symmetric, no tenderness/mass/nodules Lungs: clear to auscultation bilaterally Heart: regular rate and rhythm, S1, S2 normal, no murmur, click, rub or gallop Extremities: extremities normal, atraumatic, no cyanosis or edema Pulses: Diminished pedal pulses bilaterally Skin: Skin color, texture, turgor normal. No rashes or lesions  EKG not performed today      ASSESSMENT AND PLAN:   Peripheral arterial disease [facility]) Mr. Alex Charles returns today for follow-up of PAD.  I initially saw him approximately 7 months ago at the request of Dr. Bjorn Pippin for lifestyle-limiting claudication.Marland Kitchen  His Doppler showed occluded SFAs bilaterally.  I began him on Pletal which I uptitrated to max dose which has afforded him some but not complete relief.  He still has lifestyle-limiting symptoms that are affecting him at work and wishes to proceed with peripheral angiography and endovascular therapy.       [name] FACP,FACC,FAHA, [facility] 01/21/2023 9:50 AM

## 2023-01-22 NOTE — BH Specialist Note (Signed)
Integrated Behavioral Health Follow Up In-Person Visit  MRN: [id number] Name: Alex Charles  Number of Integrated Behavioral Health Clinician visits: Additional Visit  Session Start time: 1105   Session End time: 1205  Total time in minutes: 60   Types of Service: Individual psychotherapy  Interpretor:No. Interpretor Name and Language: none  Subjective: Alex Charles is a 57 y.o. male Patient was referred by Angus Seller, NP for depression. Patient reports the following symptoms/concerns: socially isolated, depression, long term recovery from alcohol abuse Duration of problem: several years; Severity of problem: moderate  Objective: Mood: Euthymic and Affect: Appropriate Risk of harm to self or others: No plan to harm self or others  Patient and/or Family's Strengths/Protective Factors: Social connections, Social and Emotional competence, Concrete supports in place (healthy food, safe environments, etc.), and Sense of purpose   Goals Addressed: Patient will:  Reduce symptoms of: anxiety and depression Increase knowledge and/or ability of: coping skills and self-management skills  Demonstrate ability to: Increase healthy adjustment to current life circumstances  Progress towards Goals: Ongoing  Interventions: Interventions utilized:  CBT Cognitive Behavioral Therapy and Supportive Counseling Standardized Assessments completed: Not Needed  CBT today to explore and challenge thoughts patient has about self in context of work environment. 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 Patient has increased assertive communication.   Patient and/or Family Response: Patient engaged in session.   Assessment: Patient currently experiencing depression and anxiety exacerbated by work stress and relative social isolation. Patient is also in recovery from substance use disorder.   Patient  may benefit from CBT to explore thoughts about self and others in context of challenges with social isolation, as well as to process emotions related to this.  Plan: Follow up with behavioral health clinician on: 02/05/23  Abigail Butts, LCSW

## 2023-02-05 ENCOUNTER — Ambulatory Visit (INDEPENDENT_AMBULATORY_CARE_PROVIDER_SITE_OTHER): Payer: Self-pay | Admitting: Clinical

## 2023-02-05 DIAGNOSIS — F419 Anxiety disorder, unspecified: Secondary | ICD-10-CM

## 2023-02-05 DIAGNOSIS — F3289 Other specified depressive episodes: Secondary | ICD-10-CM

## 2023-02-05 NOTE — BH Specialist Note (Signed)
Integrated Behavioral Health Follow Up In-Person Visit  MRN: [id number] Name: Alex Charles  Number of Integrated Behavioral Health Clinician visits: Additional Visit  Session Start time: 0900   Session End time: 0954  Total time in minutes: 54   Types of Service: Individual psychotherapy  Interpretor:No. Interpretor Name and Language: none  Subjective: Alex Charles is a 57 y.o. male  Patient was referred by Angus Seller, NP for depression. Patient reports the following symptoms/concerns: socially isolated, depression, long term recovery from alcohol abuse Duration of problem: several years; Severity of problem: moderate  Objective: Mood: Anxious and Affect: Appropriate Risk of harm to self or others: No plan to harm self or others  Patient and/or Family's Strengths/Protective Factors: Social connections, Social and Emotional competence, Concrete supports in place (healthy food, safe environments, etc.), and Sense of purpose   Goals Addressed: Patient will:  Reduce symptoms of: anxiety and depression Increase knowledge and/or ability of: coping skills and self-management skills  Demonstrate ability to: Increase healthy adjustment to current life circumstances  Progress towards Goals: Ongoing  Interventions: Interventions utilized:  CBT Cognitive Behavioral Therapy and Supportive Counseling Standardized Assessments completed: Not Needed  Patient experiencing continued anxiety about work environment. CBT today to explore patient's thoughts and emotions related to the issues at work and challenge negative beliefs and worries. Made a plan of action for addressing difficult employees. Included in plan for patient to prep ahead of difficult conversations, and to engage in a self soothing behavior afterwards to calm resulting anxiety. Also encouraged to journal about the interactions and his resulting emotions and outward reactions.   Patient and/or Family Response: Patient  engaged in session.   Assessment: Patient currently experiencing depression and anxiety exacerbated by work stress and relative social isolation. Patient is also in recovery from substance use disorder.   Patient may benefit from CBT to explore thoughts about self and others in context of challenges with social isolation, as well as to process emotions related to this.  Plan: Follow up with behavioral health clinician on: 02/19/23  Abigail Butts, LCSW

## 2023-02-06 ENCOUNTER — Other Ambulatory Visit: Payer: Self-pay

## 2023-02-06 DIAGNOSIS — I739 Peripheral vascular disease, unspecified: Secondary | ICD-10-CM

## 2023-02-09 ENCOUNTER — Other Ambulatory Visit: Payer: Self-pay | Admitting: Cardiology

## 2023-02-18 ENCOUNTER — Encounter: Payer: Self-pay | Admitting: Cardiology

## 2023-02-18 ENCOUNTER — Ambulatory Visit: Payer: BC Managed Care – PPO | Attending: Cardiology | Admitting: Cardiology

## 2023-02-18 ENCOUNTER — Telehealth: Payer: Self-pay

## 2023-02-18 VITALS — BP 110/82 | HR 72 | Ht 68.0 in | Wt 206.0 lb

## 2023-02-18 DIAGNOSIS — I739 Peripheral vascular disease, unspecified: Secondary | ICD-10-CM | POA: Diagnosis not present

## 2023-02-18 DIAGNOSIS — Z720 Tobacco use: Secondary | ICD-10-CM

## 2023-02-18 DIAGNOSIS — I5022 Chronic systolic (congestive) heart failure: Secondary | ICD-10-CM

## 2023-02-18 DIAGNOSIS — I2510 Atherosclerotic heart disease of native coronary artery without angina pectoris: Secondary | ICD-10-CM

## 2023-02-18 DIAGNOSIS — E785 Hyperlipidemia, unspecified: Secondary | ICD-10-CM | POA: Diagnosis not present

## 2023-02-18 DIAGNOSIS — Z0000 Encounter for general adult medical examination without abnormal findings: Secondary | ICD-10-CM

## 2023-02-18 DIAGNOSIS — F17200 Nicotine dependence, unspecified, uncomplicated: Secondary | ICD-10-CM

## 2023-02-18 LAB — LIPID PANEL
Chol/HDL Ratio: 2.7 ratio (ref 0.0–5.0)
Cholesterol, Total: 109 mg/dL (ref 100–199)
HDL: 40 mg/dL (ref >39)
LDL Chol Calc (NIH): 50 mg/dL (ref 0–99)
Triglycerides: 102 mg/dL (ref 0–149)
VLDL Cholesterol Cal: 19 mg/dL (ref 5–40)

## 2023-02-18 MED ORDER — NICOTINE 7 MG/24HR TD PT24: 7.0000 mg | MEDICATED_PATCH | Freq: Every day | TRANSDERMAL | 3 refills | Status: DC

## 2023-02-18 NOTE — Patient Instructions (Addendum)
Medication Instructions:   Nicotrine patch  7mg   change the patch daily  *If you need a refill on your cardiac medications before your next appointment, please call your pharmacy*   Lab Work: lipid If you have labs (blood work) drawn today and your tests are completely normal, you will receive your results only by: MyChart Message (if you have MyChart) OR A paper copy in the mail If you have any lab test that is abnormal or we need to change your treatment, we will call you to review the results.   Testing/Procedures: none   Follow-Up: At [facility], you and your health needs are our priority.  As part of our continuing mission to provide you with exceptional heart care, we have created designated Provider Care Teams.  These Care Teams include your primary Cardiologist (physician) and Advanced Practice Providers (APPs -  Physician Assistants and Nurse Practitioners) who all work together to provide you with the care you need, when you need it.     Your next appointment:   6 month(s)  The format for your next appointment:   In Person  Provider:   Little Ishikawa, MD    Other Instructions  Your physician discussed the hazards of tobacco use. Tobacco use cessation is recommended and techniques and options to help you quit were discussed.

## 2023-02-18 NOTE — Progress Notes (Unsigned)
Cardiology Office Note:    Date:  02/19/2023   ID:  Alex Charles, DOB [birth year], MRN [id number]  PCP:  Ivonne Andrew, NP  Cardiologist:  Little Ishikawa, MD  Electrophysiologist:  None   Referring MD: Ivonne Andrew, NP   Chief Complaint  Patient presents with   Coronary Artery Disease    History of Present Illness:    Alex Charles is a 57 y.o. male with a hx of CAD, chronic systolic heart failure, tobacco use, former tobacco use who presents for follow-up.  He was referred by Angus Seller, NP for evaluation of CAD, initially seen 04/30/2022.  He previously followed with Dr. Anne Fu, last seen 09/2015.  Has a history of CAD and underwent PCI in [place] in 2016.  Echocardiogram showed EF 30%.  However repeat echocardiogram 08/09/2015 showed EF improved to 60 to 65%, grade 1 diastolic dysfunction, no significant valvular disease.  He denies any chest pain but reports has been having dyspnea on exertion.  States that walking up 1 flight of stairs will feel short of breath.  He has not been exercising.  Denies any lightheadedness, syncope, or lower extremity edema.  Reports has been having palpitations 1-2 times per week where feels like heart is racing, can last for hours.  Reports pain in both legs with walking.  He continues to smoke, 8 to 10 cigarettes/day.  Family history includes paternal uncle and paternal grandfather died of MIs in 70s.  Exercise Myoview on 05/07/2022 showed good exercise capacity (10.1 METS), EF 51%, normal perfusion.  Echocardiogram 06/25/2022 showed normal biventricular function, no significant valvular disease.  Zio patch x 14 days on 05/29/2022 showed 10 episodes of SVT, longest lasting 17 beats; 6 patient triggered events, corresponding to sinus rhythm and 1 episode of ectopic atrial rhythm with rate 85 bpm.  ABIs on 05/21/2022 were 0.64 right, 0.62 left.  Arterial duplex showed total occlusion in right SFA and posterior tibial arteries, total occlusion in  the left SFA and anterior tibial arteries..  Since last clinic visit, he reports that he is doing okay.  Denies any chest pain, dyspnea, lightheadedness, syncope, lower extremity edema, or palpitations.  Continues to have pain in his legs, more on the right than left.  Planning peripheral intervention with Dr. Allyson Sabal next month.  He is continuing to smoke, has cut back to 6 cigarettes/day.   Past Medical History:  Diagnosis Date   Alcoholism in recovery [facility])    happened 4 years ago/ went thru detox in the hospital   Allergy    seasonal   Anxiety    Blockage of coronary artery of heart (HCC)    1 stent put in/ in 2015   CHF (congestive heart failure) (HCC)    Depression    Hyperlipidemia    Prediabetes     Past Surgical History:  Procedure Laterality Date   CORONARY ANGIOPLASTY WITH STENT PLACEMENT  2015   one stent   OTHER SURGICAL HISTORY     tooth extracted      Current Medications: Current Meds  Medication Sig   albuterol (VENTOLIN HFA) 108 (90 Base) MCG/ACT inhaler INHALE 2 PUFFS BY MOUTH EVERY 4 HOURS AS NEEDED FOR WHEEZE OR FOR SHORTNESS OF BREATH   aspirin EC 81 MG tablet Take 1 tablet (81 mg total) by mouth daily.   busPIRone (BUSPAR) 30 MG tablet Take 1 tablet (30 mg total) by mouth 2 (two) times daily.   cetirizine (ZYRTEC) 10 MG tablet TAKE 1 TABLET BY  MOUTH EVERY DAY   diclofenac (VOLTAREN) 75 MG EC tablet TAKE 1 TABLET BY MOUTH 2 TIMES DAILY AS NEEDED.   diclofenac Sodium (VOLTAREN) 1 % GEL Apply 4 g topically 4 (four) times daily. Apply to affected areas 4 times daily as needed for pain.   ezetimibe (ZETIA) 10 MG tablet Take 1 tablet (10 mg total) by mouth daily.   fenofibrate 160 MG tablet TAKE 1 TABLET BY MOUTH EVERY DAY   hydrOXYzine (ATARAX) 10 MG tablet Take 1 tablet (10 mg total) by mouth 3 (three) times daily as needed.   metoprolol succinate (TOPROL XL) 25 MG 24 hr tablet Take 1 tablet (25 mg total) by mouth daily.   nicotine (NICODERM CQ - DOSED IN  MG/24 HR) 7 mg/24hr patch Place 1 patch (7 mg total) onto the skin daily.   rosuvastatin (CRESTOR) 40 MG tablet TAKE 1 TABLET BY MOUTH EVERY DAY   traZODone (DESYREL) 50 MG tablet TAKE 1 TABLET BY MOUTH EVERY DAY AT BEDTIME AS NEEDED FOR SLEEP     Allergies:   Patient has no known allergies.   Social History   Socioeconomic History   Marital status: Single    Spouse name: Not on file   Number of children: Not on file   Years of education: Not on file   Highest education level: Not on file  Occupational History   Not on file  Tobacco Use   Smoking status: Every Day    Current packs/day: 0.50    Types: Cigarettes   Smokeless tobacco: Never  Vaping Use   Vaping status: Never Used  Substance and Sexual Activity   Alcohol use: No   Drug use: Never   Sexual activity: Not Currently  Other Topics Concern   Not on file  Social History Narrative   Not on file   Social Determinants of Health   Financial Resource Strain: Not on file  Food Insecurity: Not on file  Transportation Needs: Not on file  Physical Activity: Not on file  Stress: Not on file  Social Connections: Not on file     Family History: The patient's family history includes Bladder Cancer in his mother; Crohn's disease in his mother; Hypertension in his father; Irritable bowel syndrome in his sister.  ROS:   Please see the history of present illness.     All other systems reviewed and are negative.  EKGs/Labs/Other Studies Reviewed:    The following studies were reviewed today:   EKG:   04/30/2022: Normal sinus rhythm, rate 75, nonspecific T wave flattening  Recent Labs: 01/14/2023: ALT 45 02/18/2023: BUN 15; Creatinine, Ser 1.10; Hemoglobin 16.5; Platelets 223; Potassium 4.7; Sodium 139  Recent Lipid Panel    Component Value Date/Time   CHOL 109 02/18/2023 0933   TRIG 102 02/18/2023 0933   HDL 40 02/18/2023 0933   CHOLHDL 2.7 02/18/2023 0933   CHOLHDL 7.7 (H) 04/08/2017 1427   VLDL 74 (H)  08/14/2016 1322   LDLCALC 50 02/18/2023 0933   LDLCALC  04/08/2017 1427     Comment:     . LDL cholesterol not calculated. Triglyceride levels greater than 400 mg/dL invalidate calculated LDL results. . Reference range: <100 . Desirable range <100 mg/dL for primary prevention;   <70 mg/dL for patients with CHD or diabetic patients  with > or = 2 CHD risk factors. Marland Kitchen LDL-C is now calculated using the Martin-Hopkins  calculation, which is a validated novel method providing  better accuracy than the Friedewald equation in  the  estimation of LDL-C.  Horald Pollen et al. Lenox Ahr. 9528;413(24): 2061-2068  (http://education.[employer].com/faq/FAQ164)     Physical Exam:    VS:  BP 110/82 (BP Location: Right Arm, Patient Position: Sitting, Cuff Size: Normal)   Pulse 72   Ht 5\' 8"  (1.727 m)   Wt 206 lb (93.4 kg)   SpO2 94%   BMI 31.32 kg/m     Wt Readings from Last 3 Encounters:  02/18/23 206 lb (93.4 kg)  01/21/23 209 lb (94.8 kg)  01/14/23 206 lb (93.4 kg)     GEN:  Well nourished, well developed in no acute distress HEENT: Normal NECK: No JVD; No carotid bruits LYMPHATICS: No lymphadenopathy CARDIAC: RRR, no murmurs, rubs, gallops RESPIRATORY:  Clear to auscultation without rales, wheezing or rhonchi  ABDOMEN: Soft, non-tender, non-distended MUSCULOSKELETAL:  No edema; No deformity  SKIN: Warm and dry NEUROLOGIC:  Alert and oriented x 3 PSYCHIATRIC:  Normal affect   ASSESSMENT:    1. Coronary artery disease involving native coronary artery of native heart without angina pectoris   2. Hyperlipidemia, unspecified hyperlipidemia type   3. Tobacco use   4. Chronic systolic heart failure (HCC)   5. PAD (peripheral artery disease) (HCC)      PLAN:    CAD: Status post stent placement in [place] in 2016.  Appears mid LAD from CT chest 03/2022.  He is reporting dyspnea on exertion.  Exercise Myoview on 05/07/2022 showed good exercise capacity (10.1 METS), EF 51%, normal  perfusion.  Echocardiogram 06/25/2022 showed normal biventricular function, no significant valvular disease.   -Continue aspirin 81 mg daily -Continue rosuvastatin 40 mg daily  Chronic systolic heart failure: EF as low as 30% in [place].  Echo 07/2015 showed EF improved to 60 to 65%.  He is reporting dyspnea on exertion.  Echocardiogram 06/25/2022 showed normal biventricular function, no significant valvular disease.    PAD: ABIs on 05/21/2022 were 0.64 right, 0.62 left.  Arterial duplex showed total occlusion in right SFA and posterior tibial arteries, total occlusion in the left SFA and anterior tibial arteries. -Referred to Dr. Allyson Sabal.  Started on Pletal with some improvement but continues to have claudication.  Planning intervention  Palpitations: Zio patch x 14 days on 05/29/2022 showed 10 episodes of SVT, longest lasting 17 beats; 6 patient triggered events, corresponding to sinus rhythm and 1 episode of ectopic atrial rhythm with rate 85 bpm.  Started on Toprol-XL 25 mg daily -Reports palpitations significantly improved since starting metoprolol, will continue  Hyperlipidemia: On rosuvastatin 40 mg daily, LDL 87 on 08/06/2022.  Zetia 10 mg daily added, will repeat lipid panel  Tobacco use: Patient counseled on the risk of tobacco use and cessation strongly recommended.  He is interested in trying nicotine patches, will prescribe.  Will ask our care guide Amy to reach out to patient to aid in cessation  RTC in 6 months    Medication Adjustments/Labs and Tests Ordered: Current medicines are reviewed at length with the patient today.  Concerns regarding medicines are outlined above.  Orders Placed This Encounter  Procedures   Lipid panel   Referral to HRT/VAS Care Navigation   Meds ordered this encounter  Medications   nicotine (NICODERM CQ - DOSED IN MG/24 HR) 7 mg/24hr patch    Sig: Place 1 patch (7 mg total) onto the skin daily.    Dispense:  30 patch    Refill:  3    Patient  Instructions  Medication Instructions:   Nicotrine patch  7mg   change the patch daily  *If you need a refill on your cardiac medications before your next appointment, please call your pharmacy*   Lab Work: lipid If you have labs (blood work) drawn today and your tests are completely normal, you will receive your results only by: MyChart Message (if you have MyChart) OR A paper copy in the mail If you have any lab test that is abnormal or we need to change your treatment, we will call you to review the results.   Testing/Procedures: none   Follow-Up: At [facility], you and your health needs are our priority.  As part of our continuing mission to provide you with exceptional heart care, we have created designated Provider Care Teams.  These Care Teams include your primary Cardiologist (physician) and Advanced Practice Providers (APPs -  Physician Assistants and Nurse Practitioners) who all work together to provide you with the care you need, when you need it.     Your next appointment:   6 month(s)  The format for your next appointment:   In Person  Provider:   Little Ishikawa, MD    Other Instructions  Your physician discussed the hazards of tobacco use. Tobacco use cessation is recommended and techniques and options to help you quit were discussed.     Signed, Little Ishikawa, MD  02/19/2023 3:58 PM    [facility]

## 2023-02-18 NOTE — Telephone Encounter (Signed)
Called patient to discuss health coaching for smoking cessation per referral. Patient is interested in health coaching and has been scheduled for his initial session in-person on 8/29 at 11:30am.   Renaee Munda, MS, ERHD, [facility]  Care Guide, Health & Wellness Coach [address] [place] [address] Telephone: [phone] Email: [email or web address]

## 2023-02-19 ENCOUNTER — Ambulatory Visit (INDEPENDENT_AMBULATORY_CARE_PROVIDER_SITE_OTHER): Payer: Self-pay | Admitting: Clinical

## 2023-02-19 ENCOUNTER — Ambulatory Visit: Payer: BC Managed Care – PPO | Attending: Cardiology

## 2023-02-19 DIAGNOSIS — F419 Anxiety disorder, unspecified: Secondary | ICD-10-CM

## 2023-02-19 DIAGNOSIS — Z0000 Encounter for general adult medical examination without abnormal findings: Secondary | ICD-10-CM

## 2023-02-19 DIAGNOSIS — F3289 Other specified depressive episodes: Secondary | ICD-10-CM

## 2023-02-19 NOTE — Progress Notes (Signed)
HEALTH & WELLNESS COACHING INITIAL INTAKE               Start time: 11:35am   End time: 12:30pm   Total Mins: 55 minutes   What are the Patient's goals from Coaching?  Patient wants to work towards smoking cessation.   Why did they seek coaching now?  Patient desires to stop smoking prior to his procedure on 9/9 to work towards improving his overall health.    Readiness  What stage is the patient in regarding their goal(s)?    Patient is in the preparation stage of smoking cessation and has identified his approach to achieving his goal.   Agreement Signed & Returned? Reviewed Coaching Agreement and Code of Ethics with Patient during initial session. Answered any questions the patient had if any regarding the Coaching Agreement and Code of Ethics. Patient verbally agreed to adhere to the Coaching Agreement and to abide by the Code of Ethics.  Mailed patient with a hard/electronic copy of the Coaching Agreement and Code of Ethics. Emailed patient a copy of his action steps and documents to support his action steps.  Patient was provided a copy of the Quit4Life workbook.    Coaching Progress Notes:   Patient shared that he started smoking around the age of 19-20 and has attempted to quit at least once. Patient shared that during the attempts to quit smoking he was not committed to achieving that goal and didn't have a true desire to quit despite knowing the ramifications.  Patient stated that he is ready to quit because his health is more important than cigarettes. Patient expressed that he believes that quitting will help improve his health associated with PAD. Patient reported that he currently smokes an average of 10 cigarettes per day and on some occasions, he tends to smoke more depending on his stress level. Patient mentioned that he has a smoking routine that includes smoking with his morning coffee, on his work breaks and lunch, after work and at times before bed.  Patient  stated that his is also triggered to smoke when he is around others that increases how many cigarettes, he would smoke each day. Patient shared that he smokes when he feels he needs a mental break and is sorting his thoughts. Patient mentioned that the morning cigarette would be the most challenging to refrain from having.   Discussed with patient strategies that he felt would be feasible for him to implement to achieve smoking cessation. Patient expressed that he desires to quit cold [place] although he has been prescribed nicotine patches, he does not want to substitute nicotine sources so that he can be nicotine free prior to his procedure on 9/9. Patient stated that he is concerned about the withdrawal symptoms.   Discussed alternatives to NRT and patient stated that he would prefer medication that would be helpful in controlling the cravings to smoke. Discussed with patient how he could manage those cravings after quitting cold [place]. Patient co-created action steps that included replacing smoking in the morning with exercise, drinking iced water, refrain from engaging in smoking with others when triggered, and practicing ways that would help reduce his stress to avoid smoking to cope.     Coaching Outcomes Patient co-created the following smoking cessation action plan to implement over the next week as outlined below.   Overall Goal(s): Smoking Cessation - Quit Date: February 20, 2023   Agreement/Action Steps: Refrain from smoking starting Aug. 30th Engage in an exercise routine in  the morning up to 15 minutes Drink iced water when cravings occur Practice the 5-4-3-2-1 method when cravings occur Remove self from around others when they smoke Utilize support system Incorporate faith practices    Resources: Informed Dr. Bjorn Pippin that the patch prefers medication to assist with smoking cessation rather than using the nicotine patches because he wants to be nicotine free before the day of  his procedure on 9/9 with Dr. Allyson Sabal.

## 2023-02-19 NOTE — BH Specialist Note (Signed)
Integrated Behavioral Health Follow Up In-Person Visit  MRN: [id number] Name: Alex Charles  Number of Integrated Behavioral Health Clinician visits: Additional Visit  Session Start time: 1000   Session End time: 1100  Total time in minutes: 60   Types of Service: Individual psychotherapy  Interpretor:No. Interpretor Name and Language: none  Subjective: Alex Charles is a 57 y.o. male  Patient was referred by Angus Seller, NP for depression. Patient reports the following symptoms/concerns: socially isolated, depression, long term recovery from alcohol abuse Duration of problem: several years; Severity of problem: moderate  Objective: Mood: Euthymic and Affect: Appropriate Risk of harm to self or others: No plan to harm self or others  Patient and/or Family's Strengths/Protective Factors: Social connections, Social and Emotional competence, Concrete supports in place (healthy food, safe environments, etc.), and Sense of purpose   Goals Addressed: Patient will:  Reduce symptoms of: anxiety and depression Increase knowledge and/or ability of: coping skills and self-management skills  Demonstrate ability to: Increase healthy adjustment to current life circumstances  Progress towards Goals: Ongoing  Interventions: Interventions utilized:  CBT Cognitive Behavioral Therapy and Supportive Counseling Standardized Assessments completed: Not Needed  CBT today. Patient has tried some journaling since last session. Explored beliefs about self in context of challenges at work. Challenged belief patient has about self that he is responsible for others' emotions at work and feels that he needs others to like him. Patient reframed these thoughts as needing to "take the personal out of it." Reflected on patient's physiological changes that arise when he is anxious in challenging interpersonal interactions at work. Discussed journaling about these changes and associated thoughts and  emotions, as well as taking some intentional time to regulate physiological symptoms.  Patient and/or Family Response: Patient engaged in session.  Assessment: Patient currently experiencing depression and anxiety exacerbated by work stress and relative social isolation. Patient is also in recovery from substance use disorder.   Patient may benefit from CBT to explore thoughts about self and others in context of challenges with social isolation, as well as to process emotions related to this.  Plan: Follow up with behavioral health clinician on: 03/05/23 Behavioral recommendations: continue journaling, mindful activities for regulating nervous system reactions  Abigail Butts, LCSW

## 2023-02-20 NOTE — Progress Notes (Signed)
Patient sent email regarding his concerns with cravings since he stopped smoking cold [place] today. Patient requested an update on his interest on taking medication for smoking cessation instead of using nicotine patches as a substitute. Reached out to Dr. Bjorn Pippin regarding the patient's concerns. Dr. Bjorn Pippin advised that the patient try using the nicotine patches because the medication used for smoking cessation would not have an immediate effect on reducing cravings. Replied to patient's email regarding his provider's advice.     Renaee Munda, MS, ERHD, [facility]  Care Guide, Health & Wellness Coach [address], [address] [place] [address] Telephone: [phone] Email: [email or web address]

## 2023-02-25 ENCOUNTER — Encounter: Payer: Self-pay | Admitting: Cardiology

## 2023-02-26 ENCOUNTER — Ambulatory Visit: Payer: BC Managed Care – PPO | Attending: Internal Medicine

## 2023-02-26 ENCOUNTER — Telehealth: Payer: Self-pay | Admitting: *Deleted

## 2023-02-26 DIAGNOSIS — Z0000 Encounter for general adult medical examination without abnormal findings: Secondary | ICD-10-CM

## 2023-02-26 NOTE — Telephone Encounter (Signed)
We can try chantix: should take 0.5 mg once daily for 3 days, then 0.5 mg BID for 4 days, then 1 mg BID starting on day 8 for total treatment of 12 weeks.  Should aim to quit smoking on day 8

## 2023-02-26 NOTE — Progress Notes (Signed)
Appointment Outcome:  Completed, Session #: 1                         Start time: 9:03am   End time: 9:47am   Total Mins: 44 minutes  AGREEMENTS SECTION   Overall Goal(s): Smoking Cessation - Quit Date: February 20, 2023   Agreement/Action Steps: Refrain from smoking starting Aug. 30th Engage in an exercise routine in the morning up to 15 minutes Drink iced water when cravings occur Practice the 5-4-3-2-1 method when cravings occur Remove self from around others when they smoke Utilize support system Incorporate faith practices      Progress Notes:  Patient reported that he has had a challenging time quitting smoking over the past week. Patient stated that he is currently wearing nicotine patches and chewing nicotine gum. Patient shared that these NRTs have not been successful in helping him quit by August 30th. Patient mentioned that he has been able to cut down on smoking from 10 cigarettes per day to 6 cigarettes per day implementing his action steps.  Patient expressed that refraining from smoking in the morning is the most challenging. Patient mentioned that when he has time to slow down, implement strategies such as deep breathing, and practicing the 5-4-3-2-1 method, he processes his thoughts and emotions that helps him refrain from smoking in the morning. Patient stated that when he has a shorter window of time in the morning is when he is most overwhelmed with the urge to smoke.   Patient stated that he learned through some experiences with being around others that smoked or triggered him. Patient shared that he managed to control how often he smoked around others. Patient recognized at another time that smoking in response to a stressful situation is not the way that he wants to cope and found smoking unpleasant. Patient expressed that after reflecting that he needs to work his way through stressful moments, while refraining from smoking.   Patient shared his smoking behavior  while at work. Patient stated that he can refrain from smoking at work by keeping himself busy with completing tasks or processing information so that he has an internal compass to guide what he will do moving forward. Patient stated that one day after refraining from smoking, he allowed himself a cigarette as a reward during his last break. Discussed with patient how he can process stressful days without such award. Patient shared that he does not want to substitute one behavior for another in a negative way, so he would prefer to talk himself through that moment to process the day and/or how he is currently feeling.   Patient has begun to identify those that would be helpful supporting his journey to quit smoking. Patient shared how he has incorporated his faith practices into the process of seeking direction to guide him through smoking cessation. Discussed with patient what he thought was feasible to work towards over the next week to aid in smoking cessation. Patient sent a message via MyChart inquiring about using Chantix or Wellbutrin for a smoking cessation aid. Patient stated he is waiting to hear back regarding his question. Discussed with patient how he can continue to work towards reducing his smoking while waiting for a response.   Patient stated that he has become very mindful of his triggers, and how he is processing tasks and thoughts to reduce anxiety, has been instrumental in assisting him to reduce his smoking. Patient shared that he could work  on eliminating 1-2 cigarettes, aiming to smoke no more than 4-5 cigarettes per day over the next few days. Patient mentioned that he would work on eliminating a cigarette during his lunch break, and before bed. Patient stated that he has also stopped purchasing a pack of cigarettes per day as a reserve to minimize the chance of smoking more. Patient shared that he engages in tasks at home to occupy his mind, such as building Lego's.    Indicators of  Success and Accountability: Patient reduced his smoking from 10 cigarettes per day to 6 cigarettes per day by incorporating his action steps.   Readiness: Patient is in the action stage of smoking cessation.   Strengths and Supports: Patient is relying on being mindful, having the ability to process/analyze behaviors, and being focused. Patient is being supported via counseling.  Challenges and Barriers: Patient have several circumstances that he is working on processing at once, which he feels is a challenge to him quitting because of feeling anxious.    Coaching Outcomes: Sent Dr. Bjorn Pippin a secured chat informing him of the patient's MyChart message. The provider replied that he will look. Will follow up with the patient regarding Dr. Campbell Lerner recommendations.  Patient did not set a quit date during this session. Patient will continue to work to reduce his smoking by eliminating 1-2 cigarettes per day.   Patient's action steps have been revised as outlined below that he will implement over the next two weeks.    Agreement/Action Steps: Reduce smoking to 4-5 cigarettes per day Refrain from smoking after lunch and before bed Practice deep breathing Drink iced water when cravings occur Practice the 5-4-3-2-1 method when cravings occur Remove self from around others when they smoke Utilize support system Incorporate faith practices    Attempted: Fulfilled - Patient implemented the bi-weekly agreement in full and was able to meet the challenge.  Not met - Patient did not meet his quit date of August 30th.    Resources: Emailed patient a copy of his action steps.

## 2023-02-26 NOTE — Telephone Encounter (Addendum)
Abdominal aortogram scheduled at [facility] for: Monday March 02, 2023 12 Noon Arrival time [facility] Main Entrance A at: 10 AM  Nothing to eat after midnight prior to procedure, clear liquids until 5 AM day of procedure.  Medication instructions: -Usual morning medications can be taken with sips of water including aspirin 81 mg.  Plan to go home the same day, you will only stay overnight if medically necessary.  You must have responsible adult to drive you home.  Someone must be with you the first 24 hours after you arrive home.  Reviewed procedure instructions with patient.

## 2023-03-02 ENCOUNTER — Ambulatory Visit (HOSPITAL_COMMUNITY)
Admission: RE | Admit: 2023-03-02 | Discharge: 2023-03-02 | Disposition: A | Discharge status: Home / Self Care | Payer: BC Managed Care – PPO | Attending: Cardiovascular Disease | Admitting: Cardiovascular Disease

## 2023-03-02 ENCOUNTER — Encounter (HOSPITAL_COMMUNITY)
Admission: RE | Disposition: A | Discharge status: Home / Self Care | Payer: Self-pay | Source: Home / Self Care | Attending: Cardiovascular Disease

## 2023-03-02 ENCOUNTER — Other Ambulatory Visit: Payer: Self-pay

## 2023-03-02 ENCOUNTER — Other Ambulatory Visit: Payer: Self-pay | Admitting: Cardiology

## 2023-03-02 DIAGNOSIS — I10 Essential (primary) hypertension: Secondary | ICD-10-CM | POA: Insufficient documentation

## 2023-03-02 DIAGNOSIS — I739 Peripheral vascular disease, unspecified: Secondary | ICD-10-CM | POA: Diagnosis not present

## 2023-03-02 DIAGNOSIS — E785 Hyperlipidemia, unspecified: Secondary | ICD-10-CM | POA: Insufficient documentation

## 2023-03-02 DIAGNOSIS — F17210 Nicotine dependence, cigarettes, uncomplicated: Secondary | ICD-10-CM | POA: Insufficient documentation

## 2023-03-02 DIAGNOSIS — I70213 Atherosclerosis of native arteries of extremities with intermittent claudication, bilateral legs: Secondary | ICD-10-CM | POA: Diagnosis not present

## 2023-03-02 DIAGNOSIS — Z7902 Long term (current) use of antithrombotics/antiplatelets: Secondary | ICD-10-CM | POA: Insufficient documentation

## 2023-03-02 HISTORY — PX: ABDOMINAL AORTOGRAM W/LOWER EXTREMITY: CATH118223

## 2023-03-02 SURGERY — ABDOMINAL AORTOGRAM W/LOWER EXTREMITY
Anesthesia: LOCAL

## 2023-03-02 MED ORDER — LABETALOL HCL 5 MG/ML IV SOLN: 10.0000 mg | INTRAVENOUS | Status: DC | PRN

## 2023-03-02 MED ORDER — LIDOCAINE HCL (PF) 1 % IJ SOLN
INTRAMUSCULAR | Status: DC | PRN
  Administered 2023-03-02: 15 mL

## 2023-03-02 MED ORDER — ONDANSETRON HCL 4 MG/2ML IJ SOLN: 4.0000 mg | Freq: Four times a day (QID) | INTRAMUSCULAR | Status: DC | PRN

## 2023-03-02 MED ORDER — ACETAMINOPHEN 325 MG PO TABS: 650.0000 mg | ORAL_TABLET | ORAL | Status: DC | PRN

## 2023-03-02 MED ORDER — SODIUM CHLORIDE 0.9 % IV SOLN: INTRAVENOUS | Status: DC

## 2023-03-02 MED ORDER — HYDRALAZINE HCL 20 MG/ML IJ SOLN: 5.0000 mg | INTRAMUSCULAR | Status: DC | PRN

## 2023-03-02 MED ORDER — HEPARIN (PORCINE) IN NACL 1000-0.9 UT/500ML-% IV SOLN
INTRAVENOUS | Status: DC | PRN
  Administered 2023-03-02 (×2): 500 mL

## 2023-03-02 MED ORDER — FENTANYL CITRATE (PF) 100 MCG/2ML IJ SOLN
INTRAMUSCULAR | Status: AC
  Filled 2023-03-02: qty 2

## 2023-03-02 MED ORDER — MORPHINE SULFATE (PF) 2 MG/ML IV SOLN: 2.0000 mg | INTRAVENOUS | Status: DC | PRN

## 2023-03-02 MED ORDER — SODIUM CHLORIDE 0.9 % WEIGHT BASED INFUSION: 1.0000 mL/kg/h | INTRAVENOUS | Status: DC

## 2023-03-02 MED ORDER — FENTANYL CITRATE (PF) 100 MCG/2ML IJ SOLN
INTRAMUSCULAR | Status: DC | PRN
  Administered 2023-03-02 (×2): 25 ug via INTRAVENOUS

## 2023-03-02 MED ORDER — SODIUM CHLORIDE 0.9 % IV SOLN: 250.0000 mL | INTRAVENOUS | Status: DC | PRN

## 2023-03-02 MED ORDER — SODIUM CHLORIDE 0.9% FLUSH: 3.0000 mL | INTRAVENOUS | Status: DC | PRN

## 2023-03-02 MED ORDER — ASPIRIN 81 MG PO TBEC: 81.0000 mg | DELAYED_RELEASE_TABLET | Freq: Every day | ORAL | Status: DC

## 2023-03-02 MED ORDER — MIDAZOLAM HCL 2 MG/2ML IJ SOLN
INTRAMUSCULAR | Status: AC
  Filled 2023-03-02: qty 2

## 2023-03-02 MED ORDER — MIDAZOLAM HCL 2 MG/2ML IJ SOLN
INTRAMUSCULAR | Status: DC | PRN
  Administered 2023-03-02 (×2): 1 mg via INTRAVENOUS

## 2023-03-02 MED ORDER — ASPIRIN 81 MG PO CHEW: 81.0000 mg | CHEWABLE_TABLET | ORAL | Status: DC

## 2023-03-02 MED ORDER — VARENICLINE TARTRATE 0.5 MG PO TABS: ORAL_TABLET | ORAL | 0 refills | Status: DC

## 2023-03-02 MED ORDER — LIDOCAINE HCL (PF) 1 % IJ SOLN
INTRAMUSCULAR | Status: AC
  Filled 2023-03-02: qty 30

## 2023-03-02 MED ORDER — SODIUM CHLORIDE 0.9% FLUSH: 3.0000 mL | Freq: Two times a day (BID) | INTRAVENOUS | Status: DC

## 2023-03-02 MED ORDER — SODIUM CHLORIDE 0.9 % WEIGHT BASED INFUSION
3.0000 mL/kg/h | INTRAVENOUS | Status: AC
  Administered 2023-03-02: 3 mL/kg/h via INTRAVENOUS

## 2023-03-02 SURGICAL SUPPLY — 11 items
CATH ANGIO 5F PIGTAIL 65CM (CATHETERS) IMPLANT
CLOSURE MYNX CONTROL 5F (Vascular Products) IMPLANT
KIT SYRINGE INJ CVI SPIKEX1 (MISCELLANEOUS) IMPLANT
SET ATX-X65L (MISCELLANEOUS) IMPLANT
SHEATH PINNACLE 5F 10CM (SHEATH) IMPLANT
SHEATH PROBE COVER 6X72 (BAG) IMPLANT
STOPCOCK MORSE 400PSI 3WAY (MISCELLANEOUS) IMPLANT
TRANSDUCER W/STOPCOCK (MISCELLANEOUS) ×1 IMPLANT
TRAY PV CATH (CUSTOM PROCEDURE TRAY) ×1 IMPLANT
TUBING CIL FLEX 10 FLL-RA (TUBING) IMPLANT
WIRE HI TORQ VERSACORE-J 145CM (WIRE) IMPLANT

## 2023-03-02 NOTE — Discharge Instructions (Signed)
Femoral Site Care This sheet gives you information about how to care for yourself after your procedure. Your health care provider may also give you more specific instructions. If you have problems or questions, contact your health care provider. What can I expect after the procedure?  After the procedure, it is common to have: Bruising that usually fades within 1-2 weeks. Tenderness at the site. Follow these instructions at home: Wound care Follow instructions from your health care provider about how to take care of your insertion site. Make sure you: Wash your hands with soap and water before you change your bandage (dressing). If soap and water are not available, use hand sanitizer. Remove your dressing as told by your health care provider. 24 hours Do not take baths, swim, or use a hot tub until your health care provider approves. You may shower 24-48 hours after the procedure or as told by your health care provider. Gently wash the site with plain soap and water. Pat the area dry with a clean towel. Do not rub the site. This may cause bleeding. Do not apply powder or lotion to the site. Keep the site clean and dry. Check your femoral site every day for signs of infection. Check for: Redness, swelling, or pain. Fluid or blood. Warmth. Pus or a bad smell. Activity For the first 2-3 days after your procedure, or as long as directed: Avoid climbing stairs as much as possible. Do not squat. Do not lift anything that is heavier than 10 lb (4.5 kg), or the limit that you are told, until your health care provider says that it is safe. For 5 days Rest as directed. Avoid sitting for a long time without moving. Get up to take short walks every 1-2 hours. Do not drive for 24 hours if you were given a medicine to help you relax (sedative). General instructions Take over-the-counter and prescription medicines only as told by your health care provider. Keep all follow-up visits as told by your  health care provider. This is important. Contact a health care provider if you have: A fever or chills. You have redness, swelling, or pain around your insertion site. Get help right away if: The catheter insertion area swells very fast. You pass out. You suddenly start to sweat or your skin gets clammy. The catheter insertion area is bleeding, and the bleeding does not stop when you hold steady pressure on the area. The area near or just beyond the catheter insertion site becomes pale, cool, tingly, or numb. These symptoms may represent a serious problem that is an emergency. Do not wait to see if the symptoms will go away. Get medical help right away. Call your local emergency services (911 in the [place] Do not drive yourself to the hospital. Summary After the procedure, it is common to have bruising that usually fades within 1-2 weeks. Check your femoral site every day for signs of infection. Do not lift anything that is heavier than 10 lb (4.5 kg), or the limit that you are told, until your health care provider says that it is safe. This information is not intended to replace advice given to you by your health care provider. Make sure you discuss any questions you have with your health care provider. Document Revised: 06/22/2017 Document Reviewed: 06/22/2017 Elsevier Patient Education  2020 Elsevier Inc.  

## 2023-03-02 NOTE — Interval H&P Note (Signed)
History and Physical Interval Note:  03/02/2023 12:11 PM  Alex Charles  has presented today for surgery, with the diagnosis of pad.  The various methods of treatment have been discussed with the patient and family. After consideration of risks, benefits and other options for treatment, the patient has consented to  Procedure(s): ABDOMINAL AORTOGRAM W/LOWER EXTREMITY (N/A) as a surgical intervention.  The patient's history has been reviewed, patient examined, no change in status, stable for surgery.  I have reviewed the patient's chart and labs.  Questions were answered to the patient's satisfaction.     Nanetta Batty

## 2023-03-02 NOTE — H&P (Signed)
03/02/2023 [name]   August 12, 1965  [id number]   Primary Physician Ivonne Andrew, NP Primary Cardiologist: [name] [name], [place]   HPI:  Dai Duhn is a 57 y.o.   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  He was referred by his primary cardiologist, Dr. Bjorn Pippin , for peripheral vascular evaluation and treatment.  I last saw him in the office 10/08/2022.  His cardiac risk factor profile is notable for ongoing tobacco abuse having smoked 40 pack years now smoking 8 cigarettes a day.  History of hypertension hyperlipidemia.  There is no family history for heart disease.  He has had a stent in his heart in [facility] April 2016.  Recent Myoview stress test was nonischemic.  He denies chest pain or shortness of breath.  He does have symmetric claudication of the last 5 to 7 years which is lifestyle limiting.  Recent Dopplers performed 05/21/2022 revealed ABIs in the 0.6 range with occluded SFAs bilaterally.    I began him on Pletal 50 mg a day and uptitrated to 100 mg twice daily.  He continues to have lifestyle-limiting claudication which is affecting him at work.  He wishes to proceed with an endovascular approach.  We did talk about lifestyle modification and he has agreed to stop smoking.       Active Medications      Current Meds  Medication Sig   albuterol (VENTOLIN HFA) 108 (90 Base) MCG/ACT inhaler INHALE 2 PUFFS BY MOUTH EVERY 4 HOURS AS NEEDED FOR WHEEZE OR FOR SHORTNESS OF BREATH   aspirin EC 81 MG tablet Take 1 tablet (81 mg total) by mouth daily.   busPIRone (BUSPAR) 30 MG tablet Take 1 tablet (30 mg total) by mouth 2 (two) times daily.   cetirizine (ZYRTEC) 10 MG tablet TAKE 1 TABLET BY MOUTH EVERY DAY   cilostazol (PLETAL) 100 MG tablet Take 1 tablet (100 mg total) by mouth 2 (two) times daily.   diclofenac (VOLTAREN) 75 MG EC tablet TAKE 1 TABLET BY MOUTH  2 TIMES DAILY AS NEEDED.   diclofenac Sodium (VOLTAREN) 1 % GEL Apply 4 g topically 4 (four) times daily. Apply to affected areas 4 times daily as needed for pain.   ezetimibe (ZETIA) 10 MG tablet Take 1 tablet (10 mg total) by mouth daily.   fenofibrate 160 MG tablet TAKE 1 TABLET BY MOUTH EVERY DAY   hydrOXYzine (ATARAX) 10 MG tablet Take 1 tablet (10 mg total) by mouth 3 (three) times daily as needed.   metoprolol succinate (TOPROL XL) 25 MG 24 hr tablet Take 1 tablet (25 mg total) by mouth daily.   nicotine polacrilex (NICORETTE) 2 MG gum Take 1 each (2 mg total) by mouth every 4 (four) hours while awake.   rosuvastatin (CRESTOR) 40 MG tablet Take 1 tablet (40 mg total) by mouth daily.   traZODone (DESYREL) 50 MG tablet TAKE 1 TABLET BY MOUTH EVERY DAY AT BEDTIME AS NEEDED FOR SLEEP        Allergies  No Known Allergies     Social History         Socioeconomic History   Marital status: Single      Spouse name: Not on file   Number of children: Not on file   Years of education: Not on file   Highest education level: Not  on file  Occupational History   Not on file  Tobacco Use   Smoking status: Every Day      Current packs/day: 0.50      Types: Cigarettes   Smokeless tobacco: Never  Vaping Use   Vaping status: Never Used  Substance and Sexual Activity   Alcohol use: No   Drug use: Never   Sexual activity: Not Currently  Other Topics Concern   Not on file  Social History Narrative   Not on file    Social Determinants of Health    Financial Resource Strain: Not on file  Food Insecurity: Not on file  Transportation Needs: Not on file  Physical Activity: Not on file  Stress: Not on file  Social Connections: Not on file  Intimate Partner Violence: Not on file      Review of Systems: General: negative for chills, fever, night sweats or weight changes.  Cardiovascular: negative for chest pain, dyspnea on exertion, edema, orthopnea, palpitations, paroxysmal nocturnal  dyspnea or shortness of breath Dermatological: negative for rash Respiratory: negative for cough or wheezing Urologic: negative for hematuria Abdominal: negative for nausea, vomiting, diarrhea, bright red blood per rectum, melena, or hematemesis Neurologic: negative for visual changes, syncope, or dizziness All other systems reviewed and are otherwise negative except as noted above.       Blood pressure 118/82, pulse 69, height 5' 8.5" (1.74 m), weight 209 lb (94.8 kg), SpO2 94%.  General appearance: alert and no distress Neck: no adenopathy, no carotid bruit, no JVD, supple, symmetrical, trachea midline, and thyroid not enlarged, symmetric, no tenderness/mass/nodules Lungs: clear to auscultation bilaterally Heart: regular rate and rhythm, S1, S2 normal, no murmur, click, rub or gallop Extremities: extremities normal, atraumatic, no cyanosis or edema Pulses: Diminished pedal pulses bilaterally Skin: Skin color, texture, turgor normal. No rashes or lesions   EKG not performed today       ASSESSMENT AND PLAN:    Peripheral arterial disease [facility]) Mr. Biddix returns today for follow-up of PAD.  I initially saw him approximately 7 months ago at the request of Dr. Bjorn Pippin for lifestyle-limiting claudication.Marland Kitchen  His Doppler showed occluded SFAs bilaterally.  I began him on Pletal which I uptitrated to max dose which has afforded him some but not complete relief.  He still has lifestyle-limiting symptoms that are affecting him at work and wishes to proceed with peripheral angiography and endovascular therapy.       Runell Gess, M.D., FACP, [facility], [facility] [address] [name], [place]  [address]  [phone] 03/02/2023 10:26 AM

## 2023-03-03 ENCOUNTER — Encounter (HOSPITAL_COMMUNITY): Payer: Self-pay | Admitting: Cardiovascular Disease

## 2023-03-03 ENCOUNTER — Other Ambulatory Visit: Payer: Self-pay

## 2023-03-03 MED ORDER — VARENICLINE TARTRATE 0.5 MG PO TABS: ORAL_TABLET | ORAL | 0 refills | Status: DC

## 2023-03-04 ENCOUNTER — Other Ambulatory Visit: Payer: Self-pay | Admitting: Cardiology

## 2023-03-04 ENCOUNTER — Telehealth: Payer: Self-pay

## 2023-03-04 ENCOUNTER — Ambulatory Visit: Payer: BC Managed Care – PPO

## 2023-03-04 DIAGNOSIS — Z0000 Encounter for general adult medical examination without abnormal findings: Secondary | ICD-10-CM

## 2023-03-04 NOTE — Telephone Encounter (Signed)
Patient returned call to reschedule health coaching appointment to 9/13 at 11:00am. Appointment has been reschedule and patient will be called at that time.    Renaee Munda, MS, ERHD, [facility]  Care Guide, Health & Wellness Coach [address], [address] [place] [address] Telephone: [phone] Email: [email or web address]

## 2023-03-04 NOTE — Telephone Encounter (Signed)
Called patient to hold health coaching session over the phone as scheduled. Patient did not answer. Was not able to leave message due to voicemail not being setup.   Renaee Munda, MS, ERHD, [facility]  Care Guide, Health & Wellness Coach [address] [place] [address] Telephone: [phone] Email: [email or web address]

## 2023-03-05 ENCOUNTER — Ambulatory Visit: Payer: Self-pay | Admitting: Clinical

## 2023-03-06 ENCOUNTER — Ambulatory Visit: Payer: BC Managed Care – PPO

## 2023-03-06 ENCOUNTER — Telehealth: Payer: Self-pay

## 2023-03-06 NOTE — Telephone Encounter (Signed)
Attempted to call pt. No answer and voicemail is not set up.

## 2023-03-09 NOTE — Telephone Encounter (Signed)
Attempted to call pt. No answer and no voicemail is set up.

## 2023-03-09 NOTE — Telephone Encounter (Signed)
Spoke with pt regarding sooner office visit per Dr. Allyson Sabal to reschedule PV procedure. Pt scheduled. Pt also mentions that he has paperwork for FMLA that he needs filled out. He will bring that to his appointment.

## 2023-03-11 ENCOUNTER — Encounter: Payer: Self-pay | Admitting: Cardiovascular Disease

## 2023-03-11 ENCOUNTER — Telehealth: Payer: Self-pay

## 2023-03-11 ENCOUNTER — Ambulatory Visit: Payer: BC Managed Care – PPO | Attending: Cardiovascular Disease | Admitting: Cardiovascular Disease

## 2023-03-11 VITALS — BP 102/70 | HR 59 | Ht 68.0 in | Wt 205.2 lb

## 2023-03-11 DIAGNOSIS — I739 Peripheral vascular disease, unspecified: Secondary | ICD-10-CM | POA: Diagnosis not present

## 2023-03-11 DIAGNOSIS — Z0000 Encounter for general adult medical examination without abnormal findings: Secondary | ICD-10-CM

## 2023-03-11 NOTE — Progress Notes (Signed)
03/11/2023 Alex Charles   01/24/1966  [id number]  Primary Physician Ivonne Andrew, NP Primary Cardiologist: [name] [name], [place]  HPI:  Alex Charles is a 57 y.o.    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  He was referred by his primary cardiologist, Dr. Bjorn Pippin , for peripheral vascular evaluation and treatment.  I last saw him in the office 03/02/2023.  His cardiac risk factor profile is notable for ongoing tobacco abuse having smoked 40 pack years now smoking 8 cigarettes a day.  History of hypertension hyperlipidemia.  There is no family history for heart disease.  He has had a stent in his heart in [facility] April 2016.  Recent Myoview stress test was nonischemic.  He denies chest pain or shortness of breath.  He does have symmetric claudication of the last 5 to 7 years which is lifestyle limiting.  Recent Dopplers performed 05/21/2022 revealed ABIs in the 0.6 range with occluded SFAs bilaterally.    I began him on Pletal 50 mg a day and uptitrated to 100 mg twice daily.  He continues to have lifestyle-limiting claudication which is affecting him at work.  He wishes to proceed with an endovascular approach.  We did talk about lifestyle modification and he has agreed to stop smoking.  I performed peripheral angiography on him 03/02/2023 revealing bilateral SFA CTO's.  The left was flush occluded making it not percutaneously addressable on the right most likely could be fixed with endovascular techniques which she wishes to pursue.  He has stopped smoking.   Current Meds  Medication Sig   albuterol (VENTOLIN HFA) 108 (90 Base) MCG/ACT inhaler INHALE 2 PUFFS BY MOUTH EVERY 4 HOURS AS NEEDED FOR WHEEZE OR FOR SHORTNESS OF BREATH   aspirin EC 81 MG tablet Take 1 tablet (81 mg total) by mouth daily.   busPIRone (BUSPAR) 30 MG tablet Take 1 tablet (30 mg total) by mouth 2 (two)  times daily.   cetirizine (ZYRTEC) 10 MG tablet TAKE 1 TABLET BY MOUTH EVERY DAY   diclofenac (VOLTAREN) 75 MG EC tablet TAKE 1 TABLET BY MOUTH 2 TIMES DAILY AS NEEDED.   diclofenac Sodium (VOLTAREN) 1 % GEL Apply 4 g topically 4 (four) times daily. Apply to affected areas 4 times daily as needed for pain.   ezetimibe (ZETIA) 10 MG tablet Take 1 tablet (10 mg total) by mouth daily.   fenofibrate 160 MG tablet TAKE 1 TABLET BY MOUTH EVERY DAY   hydrOXYzine (ATARAX) 10 MG tablet Take 1 tablet (10 mg total) by mouth 3 (three) times daily as needed.   metoprolol succinate (TOPROL-XL) 25 MG 24 hr tablet TAKE 1 TABLET (25 MG TOTAL) BY MOUTH DAILY.   nicotine (NICODERM CQ - DOSED IN MG/24 HR) 7 mg/24hr patch Place 1 patch (7 mg total) onto the skin daily.   nicotine polacrilex (NICORETTE) 2 MG gum Take 1 each (2 mg total) by mouth every 4 (four) hours while awake.   rosuvastatin (CRESTOR) 40 MG tablet TAKE 1 TABLET BY MOUTH EVERY DAY   traZODone (DESYREL) 50 MG tablet TAKE 1 TABLET BY MOUTH EVERY DAY AT BEDTIME AS NEEDED FOR SLEEP   varenicline (CHANTIX) 0.5 MG tablet Take 0.5 mg (1 tablet) daily for 3 days then take 0.5 mg ( 1 tablet ) twice a day for 4 days then take 1 mg ( 2 tablets )  twice a day for a total of 12 weeks.     No Known Allergies  Social History   Socioeconomic History   Marital status: Single    Spouse name: Not on file   Number of children: Not on file   Years of education: Not on file   Highest education level: Not on file  Occupational History   Not on file  Tobacco Use   Smoking status: Every Day    Current packs/day: 0.50    Types: Cigarettes   Smokeless tobacco: Never  Vaping Use   Vaping status: Never Used  Substance and Sexual Activity   Alcohol use: No   Drug use: Never   Sexual activity: Not Currently  Other Topics Concern   Not on file  Social History Narrative   Not on file   Social Determinants of Health   Financial Resource Strain: Not on file   Food Insecurity: Not on file  Transportation Needs: Not on file  Physical Activity: Not on file  Stress: Not on file  Social Connections: Not on file  Intimate Partner Violence: Not on file     Review of Systems: General: negative for chills, fever, night sweats or weight changes.  Cardiovascular: negative for chest pain, dyspnea on exertion, edema, orthopnea, palpitations, paroxysmal nocturnal dyspnea or shortness of breath Dermatological: negative for rash Respiratory: negative for cough or wheezing Urologic: negative for hematuria Abdominal: negative for nausea, vomiting, diarrhea, bright red blood per rectum, melena, or hematemesis Neurologic: negative for visual changes, syncope, or dizziness All other systems reviewed and are otherwise negative except as noted above.    Blood pressure 102/70, pulse (!) 59, height 5\' 8"  (1.727 m), weight 205 lb 3.2 oz (93.1 kg), SpO2 97%.  General appearance: alert and no distress Neck: no adenopathy, no carotid bruit, no JVD, supple, symmetrical, trachea midline, and thyroid not enlarged, symmetric, no tenderness/mass/nodules Lungs: clear to auscultation bilaterally Heart: regular rate and rhythm, S1, S2 normal, no murmur, click, rub or gallop Extremities: extremities normal, atraumatic, no cyanosis or edema Pulses: Diminished pedal pulses Skin: Skin color, texture, turgor normal. No rashes or lesions Neurologic: Grossly normal  EKG not performed today      ASSESSMENT AND PLAN:   Peripheral arterial disease [facility]) Alex Charles returns today for follow-up of his recent peripheral vascular angiogram which I performed 03/02/2023.  This revealed bilateral SFA CTO's.  His left SFA was flush occluded and therefore not percutaneously addressable.  His right SFA, which is his more affected leg, can probably be fit fixed with endovascular techniques which she wishes to pursue at the end of this month.     [name] FACP,FACC,FAHA,  [facility] 03/11/2023 2:14 PM

## 2023-03-11 NOTE — Telephone Encounter (Signed)
Called patient to reschedule health coaching appointment. Patient did not answer and was unable to leave a voicemail due to mailbox not being setup.   Renaee Munda, MS, ERHD, [facility]  Care Guide, Health & Wellness Coach [address] [place] [address] Telephone: [phone] Email: [email or web address]

## 2023-03-11 NOTE — Patient Instructions (Addendum)
Medication Instructions:  Your physician recommends that you continue on your current medications as directed. Please refer to the Current Medication list given to you today.  *If you need a refill on your cardiac medications before your next appointment, please call your pharmacy*   Lab Work: Your physician recommends that you return for lab work in: within 30 days of procedure (04/16/23)  If you have labs (blood work) drawn today and your tests are completely normal, you will receive your results only by: MyChart Message (if you have MyChart) OR A paper copy in the mail If you have any lab test that is abnormal or we need to change your treatment, we will call you to review the results.   Testing/Procedures: Your physician has requested that you have a lower extremity arterial duplex. During this test, ultrasound is used to evaluate arterial blood flow in the legs. Allow one hour for this exam. There are no restrictions or special instructions. This will take place at [address] [facility], [address]. To do 1-2 weeks after your procedure (10/24).   Your physician has requested that you have an ankle brachial index (ABI). During this test an ultrasound and blood pressure cuff are used to evaluate the arteries that supply the arms and legs with blood. Allow thirty minutes for this exam. There are no restrictions or special instructions. This will take place at [address] [facility], [address]. To do 1-2 weeks after your procedure (10/24).      Follow-Up: At [facility], you and your health needs are our priority.  As part of our continuing mission to provide you with exceptional heart care, we have created designated Provider Care Teams.  These Care Teams include your primary Cardiologist (physician) and Advanced Practice Providers (APPs -  Physician Assistants and Nurse Practitioners) who all work together to provide you with the care you need, when you need it.  We recommend signing  up for the patient portal called "MyChart".  Sign up information is provided on this After Visit Summary.  MyChart is used to connect with patients for Virtual Visits (Telemedicine).  Patients are able to view lab/test results, encounter notes, upcoming appointments, etc.  Non-urgent messages can be sent to your provider as well.   To learn more about what you can do with MyChart, go to [email or web address].    Your next appointment:   2-3 week(s) after your procedure (10/24)  Provider:   Nanetta Batty, MD   Other Instructions       Cardiac/Peripheral Catheterization   You are scheduled for a Peripheral Angiogram on Thursday, October 24 with Dr. Nanetta Batty.  1. Please arrive at the [facility] (Main Entrance A) at [facility] [address] [place], [place] [address] at 7:30 AM (This time is 2 hour(s) before your procedure to ensure your preparation). Free valet parking service is available. You will check in at ADMITTING. The support person will be asked to wait in the waiting room.  It is OK to have someone drop you off and come back when you are ready to be discharged.        Special note: Every effort is made to have your procedure done on time. Please understand that emergencies sometimes delay scheduled procedures.  2. Diet: Do not eat solid foods after midnight.  You may have clear liquids until 5 AM the day of the procedure.  3. Labs: You will need to have blood drawn no more than 30 days prior  to procedure (04/16/23).  4. Medication instructions in preparation for your procedure:   On the morning of your procedure, take Aspirin 81 mg and any morning medicines NOT listed above.  You may use sips of water.   5. Plan to go home the same day, you will only stay overnight if medically necessary. 6. You MUST have a responsible adult to drive you home. 7. An adult MUST be with you the first 24 hours after you arrive home. 8. Bring a current list of your  medications, and the last time and date medication taken. 9. Bring ID and current insurance cards. 10.Please wear clothes that are easy to get on and off and wear slip-on shoes.  Thank you for allowing [place] to care for you!   -- [facility]

## 2023-03-11 NOTE — Assessment & Plan Note (Signed)
Mr. Hermoso returns today for follow-up of his recent peripheral vascular angiogram which I performed 03/02/2023.  This revealed bilateral SFA CTO's.  His left SFA was flush occluded and therefore not percutaneously addressable.  His right SFA, which is his more affected leg, can probably be fit fixed with endovascular techniques which she wishes to pursue at the end of this month.

## 2023-03-12 ENCOUNTER — Telehealth: Payer: Self-pay

## 2023-03-12 DIAGNOSIS — Z0000 Encounter for general adult medical examination without abnormal findings: Secondary | ICD-10-CM

## 2023-03-12 NOTE — Telephone Encounter (Signed)
Called patient to reschedule health coaching appointment from 9/13. Patient requested that he be rescheduled for 9/25 at 10:00am. Patient's appointment was updated and he will be called at this time.    Renaee Munda, MS, ERHD, [facility]  Care Guide, Health & Wellness Coach [address] [place] [address] Telephone: [phone] Email: [email or web address]

## 2023-03-13 ENCOUNTER — Telehealth: Payer: Self-pay

## 2023-03-13 NOTE — Telephone Encounter (Signed)
Pt brought FMLA paperwork to his office visit on 9/18. Paperwork has been completed and faxed to [employer]. Confirmation of successful fax received.

## 2023-03-18 ENCOUNTER — Encounter (HOSPITAL_COMMUNITY): Payer: BC Managed Care – PPO

## 2023-03-18 ENCOUNTER — Ambulatory Visit: Payer: BC Managed Care – PPO | Attending: Cardiology

## 2023-03-18 DIAGNOSIS — Z0000 Encounter for general adult medical examination without abnormal findings: Secondary | ICD-10-CM

## 2023-03-18 NOTE — Progress Notes (Signed)
Appointment Outcome: Completed, Session #: 2                        Start time: 10:02am   End time: 10:33am   Total Mins: 31 minutes  AGREEMENTS SECTION   Overall Goal(s): Smoking cessation                                                Agreement/Action Steps:  Reduce smoking to 4-5 cigarettes per day Refrain from smoking after lunch and before bed Practice deep breathing Drink iced water when cravings occur Practice the 5-4-3-2-1 method when cravings occur Remove self from around others when they smoke Utilize support system Incorporate faith practices    Progress Notes:  Patient reported that he has been able to decrease his smoking to 2-3 cigarettes per day with the use of Chantix over the past 2 weeks. Patient mentioned that the triggers to smoke still occur. Patient shared that he has his morning cigarette with his coffee and if he tries to smoke another cigarette afterwards, he is not able to because he becomes nauseous. Patient stated that he tries to think about smoking as being gross to mentally psych himself out of smoking and asks himself if he really wants to smoke. Patient mentioned that he does smoke a cigarette after lunch and sometimes does not smoke another cigarette that day, or may have one later.   Patient stated that practicing being present with the 5-4-3-2-1 method and deep breathing were instrumental in reducing his smoking and helping him from refrain from smoking in moments when he feels anxious or overwhelmed. Patient shared that he is able to talk himself through moments when he is feeling this way to reduce his desire to smoke. Patient expressed that because he is off schedule since returning to work, he has gotten away from writing in his journal and pre-planning his day. Patient stated that these strategies were helpful in keeping him organized, reduced anxiety, and helped him process things. Patient shared that the has continued to build with Lego's because it  is a stress reliever for him.   Patient shared that he has the support of his mother with smoking cessation and she has been inspired to quit vaping in the process. Patient has not been in the company of others that smokes to have to remove himself to exercise this strategy so far. Patient mentioned that he has been drinking Gatorade more so than iced water recently, which has still helped with reducing the urge for smoking. Patient expressed that he does want to incorporate more plain iced water as he had previously.   Patient shared that he believes the journey he's on towards smoking cessation will have physical, mental, and emotional benefits in the long run. Patient mentioned that he has started to feel stronger and more capable of handling the process of smoking cessation, but understands that it's a road he is having to travel.    Indicators of Success and Accountability: Patient has been able to reduce his smoking to 2-3 cigarettes per day with the aid of Chantix and implementing his action steps consistently.   Readiness: Patient is in the action stage of smoking cessation.    Strengths and Supports: Patient is being supported by his mother. Patient is relying on being present and mindful of his  triggers to aid in refraining from smoking.   Challenges and Barriers: Patient finds it challenging to refrain from smoking a cigarette with his morning coffee due to the lapse of time from last cigarette.   Coaching Outcomes: Patient desires to implement journaling and pre-planning his day again as strategies to manage his anxiety and as activities that distract him from smoking.  Patient will be implementing the revised action plan towards smoking cessation and stress management as outlined below.    Overall Goal(s): Smoking cessation                                                Agreement/Action Steps:  Smoking Cessation Reduce smoking to 2-3 cigarettes per day Refrain from smoking  after lunch and before bed Practice deep breathing Drink iced water/Gatorade when cravings occur Remove self from around others when they smoke Utilize support system  Stress management Practice the 5-4-3-2-1 method when feeling anxious Pre-plan daily schedule/routine Write in journal (e.g., while drinking morning coffee)   Attempted: Partial - Patient was able to implement the action plan in its entirety except refraining from smoking after lunch and before bed on a consistent basis.

## 2023-03-20 ENCOUNTER — Other Ambulatory Visit: Payer: Self-pay

## 2023-03-20 DIAGNOSIS — I739 Peripheral vascular disease, unspecified: Secondary | ICD-10-CM

## 2023-03-25 ENCOUNTER — Ambulatory Visit: Payer: BC Managed Care – PPO | Attending: Cardiology

## 2023-03-25 DIAGNOSIS — Z0000 Encounter for general adult medical examination without abnormal findings: Secondary | ICD-10-CM

## 2023-03-25 NOTE — Progress Notes (Unsigned)
Appointment Outcome: Completed, Session #: 3                         Start time: 10:00am   End time: 10:30am   Total Mins: 30 minutes  AGREEMENTS SECTION   Overall Goal(s): Smoking cessation                                                 Agreement/Action Steps:  Smoking Cessation Reduce smoking to 2-3 cigarettes per day Refrain from smoking after lunch and before bed Practice deep breathing Drink iced water/Gatorade when cravings occur Remove self from around others when they smoke Utilize support system   Stress management Practice the 5-4-3-2-1 method when feeling anxious Pre-plan daily schedule/routine Write in journal (e.g., while drinking morning coffee)    Progress Notes:  Patient reported that he has been able to maintain smoking 2-3 cigarettes per day over the past week. Patient shared that he feels that he is making progress but is still challenged with giving up the morning cigarette. Patient feels that if he can get through the morning jitters, he would more likely be able to refrain from smoking the remainder of the day. Patient shared that he goes through a variety of action steps to help reduce his anxiety to keep from smoking. Patient stated that these steps have help stop his chain smoking and allowed him to decrease his smoking, along with the aid of Chantix.   Patient mentioned that he writes in his journal twice daily. Patient shared that during the morning, he writes a pep talk to himself for the day and at night, he reflects on his day. Patient stated that this may include any incidents that he may have encountered, and he objectively analyzes what was perceived as stressful during that time and how he can approach it differently if he were to experience it again.   Patient mentioned that he wants to approach the pre-planning of his day differently to help him reduce his anxiety for the next workday. Patient discussed the need to be flexible to allow for  adjustments and having a contingency plan throughout the day when confronted with stressful situations. Patient stated that it has helped him to address issues/tasks at work that are priority and have learned how to allow certain things that are not emergencies to carry over to the next day.   Patient stated that on 9/28, he was able to refrain from smoking all day, but resumed smoking mid-day on Sunday. Patient stated that on Saturday, he was not stressed and felt relaxed, until the next day when he began to think about his upcoming workday. Patient shared that he shared that after the morning cigarette, he can skip smoking during his work breaks, smokes one cigarette during lunch and sometimes one after work.   Patient stated that the grounding technique that he has been implementing has helped him reduce his anxiety and have a way to better manage moments when he recognizes the triggers and signs of panic. Patient stated that often he can control the moment that he is in by stopping to practice deep breathing and thinking critically.     Indicators of Success and Accountability: Patient was able to refrain from smoking an entire day by decreasing his stress and practicing his smoking cessation techniques.  Readiness: Patient is in the action stage of smoking cessation and stress management.   Strengths and Supports: Patient has the support of his family and is relying on his ability to be a person that processes data to make decisions.   Challenges and Barriers: Patient is still learning how to work through his anxious moments in the morning to deter smoking and not using smoking as a reward.    Coaching Outcomes: Patient has decided that he wants to move to bi-weekly sessions now that he has been able to manage his anxiety better, which has helped him reduce his smoking.   Discussed with patient what he believed would be helpful in disrupting the urge to smoke in the morning. Patient  explained that having a specific routine would be helpful that would also include deep breathing, drinking ice cold water, and writing in his journal. Patient is interested in including stretching as part of this routine in the morning and at other times when he feels anxious.     Attempted: Fulfilled - Patient completed the weekly agreement in full and was able to meet the challenge.

## 2023-04-01 ENCOUNTER — Ambulatory Visit: Payer: BC Managed Care – PPO | Admitting: Cardiovascular Disease

## 2023-04-05 ENCOUNTER — Other Ambulatory Visit: Payer: Self-pay | Admitting: Nurse Practitioner

## 2023-04-06 NOTE — Telephone Encounter (Signed)
Please advise KH 

## 2023-04-08 ENCOUNTER — Ambulatory Visit (HOSPITAL_BASED_OUTPATIENT_CLINIC_OR_DEPARTMENT_OTHER): Payer: BC Managed Care – PPO

## 2023-04-08 DIAGNOSIS — I739 Peripheral vascular disease, unspecified: Secondary | ICD-10-CM | POA: Diagnosis not present

## 2023-04-08 DIAGNOSIS — Z0000 Encounter for general adult medical examination without abnormal findings: Secondary | ICD-10-CM

## 2023-04-08 NOTE — Progress Notes (Unsigned)
Appointment Outcome: Completed, Session #: 4                        Start time: 10:00am   End time: 10:32am   Total Mins: 32 minutes  AGREEMENTS SECTION   Overall Goal(s): Smoking cessation                                                 Agreement/Action Steps:  Smoking Cessation Reduce smoking to 2-3 cigarettes per day Refrain from smoking after lunch and before bed Practice deep breathing Drink iced water/Gatorade when cravings occur Remove self from around others when they smoke Utilize support system   Stress management Practice the 5-4-3-2-1 method when feeling anxious Pre-plan daily schedule/routine Write in journal (e.g., while drinking morning coffee)    Progress Notes:  Patient reported that he feels that his progress is going well but has not conquered quitting yet. Patient mentioned that the mornings are his biggest challenge when he is scheduled to work. Patient stated that he is triggered by being anxious. Patient shared that on his days off he can avoid smoking in the morning. Patient shared that he has given up the physical cigarette for an e-cigarette for emergency use.   Patient shared that he would take 5-10 puffs during the morning from the e-cigarette when he is anxious for work compared to having a couple of cigarettes with his morning coffee. Patient shared that journaling has taken the place of those cigarettes. Patient stated that journaling helps distracts him from the desire to smoke and anxiety by writing in the morning and before bed. Patient stated that he has also tried eating real fruit popsicles, which he finds helps the most.  Patient stated that in the evening and during lunch when he gets the desire to smoke by can bypass it by keep working. Patient shared that after work he thinks of smoking as a reward for getting through the day and in the morning as desperation. Discussed with patient ways that he can work through the feeling of anxiety and  desperation in the morning to help refrain from smoking. Patient shared that he has be asking himself the question "what really makes me want to smoke?" Patient mentioned that if he can focus on that issue and find a solution or an alternative task that would be helpful.   Patient expressed that he is in the mindset of moving forward and not getting caught in the past/future thinking cycle that causes him to become anxious and want to smoke. Patient has been thinking of ways that he can change his morning routine. Patient stated that in the evening he has been focused on doing a project at home such as organizing the bookshelf to distract himself. Patient mentioned that having a task in the morning ma be helpful.     Indicators of Success and Accountability:  Patient has reduced his smoking to two occasions per day.   Readiness: Patient is in the action stage of smoking cessation and stress management.   Strengths and Supports: Patient is relying on being mindful and strategic towards his smoking because and managing stress.   Challenges and Barriers: Patient stated that the anxiety due to the anticipation of work challenges his ability to refrain from smoking in the morning on his workdays.  Coaching Outcomes: Discussed with patient the substitution of the physical cigarette with the e-cigarette. Patient stated that it was provided for him, and he does not plan on refilling it once he completes it.   Co-created with patient a plan for a morning routine when he is preparing for work that would help reduce his stress. Patient is interested in practicing deep breathing first for 2 minutes, then meditating for 5 minutes, followed by his normal journaling in the morning. Patient stated that this can take up 15 minutes or so to help him calm down, since he has found deep breathing to help pass the urge to smoke.   Patient stated that he will implement deep breathing as a transition after work before  driving home to reduce the urge to reward himself with smoking.   Patient will implement the following action plan as outlined below over the next two weeks.   Patient has not set a quit date currently.    Overall Goal(s): Smoking cessation                                                 Agreement/Action Steps:  Smoking Cessation Minimize use of e-cigarette to 10-20 puffs per day Do not buy a refill cartridge after e-cigarette is empty Practice deep breathing Eat real fruit popsicles Drink iced water/Gatorade when cravings occur Remove self from around others when they smoke Utilize support system   Stress management Practice the 5-4-3-2-1 method when feeling anxious Pre-plan daily schedule/routine Practice deep breathing for 2 mins Meditate for 5 mins Write in journal (e.g., while drinking morning coffee, before bed)    Attempted: Fulfilled - Patient completed the bi-weekly agreement in full and was able to meet the challenge.

## 2023-04-09 LAB — BASIC METABOLIC PANEL
BUN/Creatinine Ratio: 16 (ref 9–20)
BUN: 21 mg/dL (ref 6–24)
CO2: 23 mmol/L (ref 20–29)
Calcium: 9.8 mg/dL (ref 8.7–10.2)
Chloride: 103 mmol/L (ref 96–106)
Creatinine, Ser: 1.32 mg/dL — ABNORMAL HIGH (ref 0.76–1.27)
Glucose: 102 mg/dL — ABNORMAL HIGH (ref 70–99)
Potassium: 4.5 mmol/L (ref 3.5–5.2)
Sodium: 140 mmol/L (ref 134–144)
eGFR: 62 mL/min/{1.73_m2} (ref >59)

## 2023-04-09 LAB — CBC
Hematocrit: 46.4 % (ref 37.5–51.0)
Hemoglobin: 15.3 g/dL (ref 13.0–17.7)
MCH: 31.8 pg (ref 26.6–33.0)
MCHC: 33 g/dL (ref 31.5–35.7)
MCV: 97 fL (ref 79–97)
Platelets: 190 10*3/uL (ref 150–450)
RBC: 4.81 x10E6/uL (ref 4.14–5.80)
RDW: 13.1 % (ref 11.6–15.4)
WBC: 8.5 10*3/uL (ref 3.4–10.8)

## 2023-04-15 ENCOUNTER — Telehealth: Payer: Self-pay | Admitting: *Deleted

## 2023-04-15 NOTE — Telephone Encounter (Signed)
No answer, no voicemail set up.

## 2023-04-15 NOTE — Telephone Encounter (Signed)
Abdominal aortogram scheduled at [facility] for: Thursday April 16, 2023 9:30 AM Arrival time [facility] Main Entrance A at: 7:30 AM  Nothing to eat after midnight prior to procedure, clear liquids until 5 AM day of procedure.  Medication instructions: -Usual morning medications can be taken with sips of water including aspirin 81 mg.  Plan to go home the same day, you will only stay overnight if medically necessary.  You must have responsible adult to drive you home.  Someone must be with you the first 24 hours after you arrive home.  Call placed to patient to review procedure instructions, no answer, no voicemail.

## 2023-04-16 ENCOUNTER — Ambulatory Visit (HOSPITAL_COMMUNITY)
Admission: RE | Admit: 2023-04-16 | Discharge: 2023-04-17 | Disposition: A | Discharge status: Home / Self Care | Payer: BC Managed Care – PPO | Attending: Cardiovascular Disease | Admitting: Cardiovascular Disease

## 2023-04-16 ENCOUNTER — Encounter (HOSPITAL_COMMUNITY)
Admission: RE | Disposition: A | Discharge status: Home / Self Care | Payer: Self-pay | Source: Home / Self Care | Attending: Cardiovascular Disease

## 2023-04-16 ENCOUNTER — Other Ambulatory Visit: Payer: Self-pay

## 2023-04-16 DIAGNOSIS — I70211 Atherosclerosis of native arteries of extremities with intermittent claudication, right leg: Secondary | ICD-10-CM

## 2023-04-16 DIAGNOSIS — Z955 Presence of coronary angioplasty implant and graft: Secondary | ICD-10-CM | POA: Insufficient documentation

## 2023-04-16 DIAGNOSIS — Z79899 Other long term (current) drug therapy: Secondary | ICD-10-CM | POA: Insufficient documentation

## 2023-04-16 DIAGNOSIS — Z7902 Long term (current) use of antithrombotics/antiplatelets: Secondary | ICD-10-CM | POA: Diagnosis not present

## 2023-04-16 DIAGNOSIS — E785 Hyperlipidemia, unspecified: Secondary | ICD-10-CM | POA: Insufficient documentation

## 2023-04-16 DIAGNOSIS — F17210 Nicotine dependence, cigarettes, uncomplicated: Secondary | ICD-10-CM | POA: Diagnosis not present

## 2023-04-16 DIAGNOSIS — I10 Essential (primary) hypertension: Secondary | ICD-10-CM | POA: Diagnosis not present

## 2023-04-16 DIAGNOSIS — I739 Peripheral vascular disease, unspecified: Secondary | ICD-10-CM | POA: Diagnosis not present

## 2023-04-16 DIAGNOSIS — F17200 Nicotine dependence, unspecified, uncomplicated: Secondary | ICD-10-CM | POA: Diagnosis present

## 2023-04-16 HISTORY — PX: ABDOMINAL AORTOGRAM W/LOWER EXTREMITY: CATH118223

## 2023-04-16 HISTORY — PX: PERIPHERAL VASCULAR ATHERECTOMY: CATH118256

## 2023-04-16 LAB — POCT ACTIVATED CLOTTING TIME
Activated Clotting Time: 269 s
Activated Clotting Time: 250 s
Activated Clotting Time: 281 s
Activated Clotting Time: 269 s
Activated Clotting Time: 201 s
Activated Clotting Time: 158 s
Activated Clotting Time: 177 s

## 2023-04-16 SURGERY — ABDOMINAL AORTOGRAM W/LOWER EXTREMITY
Anesthesia: LOCAL

## 2023-04-16 MED ORDER — SODIUM CHLORIDE 0.9 % IV SOLN: INTRAVENOUS | Status: AC

## 2023-04-16 MED ORDER — IODIXANOL 320 MG/ML IV SOLN
INTRAVENOUS | Status: DC | PRN
  Administered 2023-04-16: 100 mL via INTRA_ARTERIAL

## 2023-04-16 MED ORDER — LIDOCAINE HCL (PF) 1 % IJ SOLN
INTRAMUSCULAR | Status: AC
  Filled 2023-04-16: qty 30

## 2023-04-16 MED ORDER — HEPARIN SODIUM (PORCINE) 1000 UNIT/ML IJ SOLN
INTRAMUSCULAR | Status: DC | PRN
  Administered 2023-04-16 (×2): 3000 [IU] via INTRAVENOUS
  Administered 2023-04-16: 10000 [IU] via INTRAVENOUS
  Administered 2023-04-16: 2000 [IU] via INTRAVENOUS

## 2023-04-16 MED ORDER — MIDAZOLAM HCL 2 MG/2ML IJ SOLN
INTRAMUSCULAR | Status: AC
Start: 2023-04-16 — End: ?
  Filled 2023-04-16: qty 2

## 2023-04-16 MED ORDER — HEPARIN SODIUM (PORCINE) 1000 UNIT/ML IJ SOLN
INTRAMUSCULAR | Status: AC
  Filled 2023-04-16: qty 10

## 2023-04-16 MED ORDER — LIDOCAINE HCL (PF) 1 % IJ SOLN
INTRAMUSCULAR | Status: DC | PRN
  Administered 2023-04-16: 10 mL

## 2023-04-16 MED ORDER — LABETALOL HCL 5 MG/ML IV SOLN: 10.0000 mg | INTRAVENOUS | Status: DC | PRN

## 2023-04-16 MED ORDER — CLOPIDOGREL BISULFATE 300 MG PO TABS
ORAL_TABLET | ORAL | Status: AC
Start: 2023-04-16 — End: ?
  Filled 2023-04-16: qty 1

## 2023-04-16 MED ORDER — SODIUM CHLORIDE 0.9% FLUSH
3.0000 mL | Freq: Two times a day (BID) | INTRAVENOUS | Status: DC
  Administered 2023-04-17: 3 mL via INTRAVENOUS

## 2023-04-16 MED ORDER — SODIUM CHLORIDE 0.9 % IV SOLN: 250.0000 mL | INTRAVENOUS | Status: DC | PRN

## 2023-04-16 MED ORDER — ASPIRIN 81 MG PO TBEC
81.0000 mg | DELAYED_RELEASE_TABLET | Freq: Every day | ORAL | Status: DC
  Administered 2023-04-17: 81 mg via ORAL
  Filled 2023-04-16: qty 1

## 2023-04-16 MED ORDER — BUSPIRONE HCL 5 MG PO TABS
30.0000 mg | ORAL_TABLET | Freq: Two times a day (BID) | ORAL | Status: DC
  Administered 2023-04-16 – 2023-04-17 (×2): 30 mg via ORAL
  Filled 2023-04-16 (×2): qty 6

## 2023-04-16 MED ORDER — HEPARIN (PORCINE) IN NACL 1000-0.9 UT/500ML-% IV SOLN
INTRAVENOUS | Status: DC | PRN
  Administered 2023-04-16 (×3): 500 mL

## 2023-04-16 MED ORDER — EZETIMIBE 10 MG PO TABS
10.0000 mg | ORAL_TABLET | Freq: Every day | ORAL | Status: DC
  Administered 2023-04-17: 10 mg via ORAL
  Filled 2023-04-16: qty 1

## 2023-04-16 MED ORDER — ASPIRIN 81 MG PO TBEC: 81.0000 mg | DELAYED_RELEASE_TABLET | Freq: Every day | ORAL | Status: DC

## 2023-04-16 MED ORDER — ALBUTEROL SULFATE (2.5 MG/3ML) 0.083% IN NEBU: 3.0000 mL | INHALATION_SOLUTION | Freq: Four times a day (QID) | RESPIRATORY_TRACT | Status: DC | PRN

## 2023-04-16 MED ORDER — MIDAZOLAM HCL 2 MG/2ML IJ SOLN
INTRAMUSCULAR | Status: DC | PRN
  Administered 2023-04-16: 1 mg via INTRAVENOUS

## 2023-04-16 MED ORDER — MORPHINE SULFATE (PF) 2 MG/ML IV SOLN
INTRAVENOUS | Status: AC
  Administered 2023-04-16: 2 mg via INTRAVENOUS
  Filled 2023-04-16: qty 1

## 2023-04-16 MED ORDER — FENTANYL CITRATE (PF) 100 MCG/2ML IJ SOLN
INTRAMUSCULAR | Status: DC | PRN
  Administered 2023-04-16: 25 ug via INTRAVENOUS

## 2023-04-16 MED ORDER — CLOPIDOGREL BISULFATE 75 MG PO TABS
75.0000 mg | ORAL_TABLET | Freq: Every day | ORAL | Status: DC
  Administered 2023-04-17: 75 mg via ORAL
  Filled 2023-04-16: qty 1

## 2023-04-16 MED ORDER — FENTANYL CITRATE (PF) 100 MCG/2ML IJ SOLN
INTRAMUSCULAR | Status: AC
  Filled 2023-04-16: qty 2

## 2023-04-16 MED ORDER — CLOPIDOGREL BISULFATE 300 MG PO TABS
ORAL_TABLET | ORAL | Status: DC | PRN
  Administered 2023-04-16: 300 mg via ORAL

## 2023-04-16 MED ORDER — ROSUVASTATIN CALCIUM 20 MG PO TABS
40.0000 mg | ORAL_TABLET | Freq: Every day | ORAL | Status: DC
  Administered 2023-04-17: 40 mg via ORAL
  Filled 2023-04-16: qty 2

## 2023-04-16 MED ORDER — METOPROLOL SUCCINATE ER 25 MG PO TB24
25.0000 mg | ORAL_TABLET | Freq: Every day | ORAL | Status: DC
  Administered 2023-04-17: 25 mg via ORAL
  Filled 2023-04-16: qty 1

## 2023-04-16 MED ORDER — MORPHINE SULFATE (PF) 2 MG/ML IV SOLN: 2.0000 mg | INTRAVENOUS | Status: DC | PRN

## 2023-04-16 MED ORDER — ACETAMINOPHEN 325 MG PO TABS: 650.0000 mg | ORAL_TABLET | ORAL | Status: DC | PRN

## 2023-04-16 MED ORDER — ASPIRIN 81 MG PO CHEW: 81.0000 mg | CHEWABLE_TABLET | ORAL | Status: DC

## 2023-04-16 MED ORDER — FENOFIBRATE 160 MG PO TABS
160.0000 mg | ORAL_TABLET | Freq: Every day | ORAL | Status: DC
  Administered 2023-04-17: 160 mg via ORAL
  Filled 2023-04-16: qty 1

## 2023-04-16 MED ORDER — ACETAMINOPHEN 325 MG PO TABS
ORAL_TABLET | ORAL | Status: AC
  Filled 2023-04-16: qty 2

## 2023-04-16 MED ORDER — HYDRALAZINE HCL 20 MG/ML IJ SOLN: 5.0000 mg | INTRAMUSCULAR | Status: DC | PRN

## 2023-04-16 MED ORDER — SODIUM CHLORIDE 0.9 % WEIGHT BASED INFUSION
3.0000 mL/kg/h | INTRAVENOUS | Status: DC
  Administered 2023-04-16: 3 mL/kg/h via INTRAVENOUS

## 2023-04-16 MED ORDER — SODIUM CHLORIDE 0.9% FLUSH: 3.0000 mL | INTRAVENOUS | Status: DC | PRN

## 2023-04-16 MED ORDER — SODIUM CHLORIDE 0.9 % WEIGHT BASED INFUSION: 1.0000 mL/kg/h | INTRAVENOUS | Status: DC

## 2023-04-16 MED ORDER — ONDANSETRON HCL 4 MG/2ML IJ SOLN: 4.0000 mg | Freq: Four times a day (QID) | INTRAMUSCULAR | Status: DC | PRN

## 2023-04-16 SURGICAL SUPPLY — 31 items
BALLN CHOCOLATE 5.0X80X120 (BALLOONS) ×2
BALLN IN.PACT DCB 5X120 (BALLOONS) ×2
BALLN IN.PACT DCB 5X150 (BALLOONS) ×2
BALLN STERLING OTW 2.5X100X150 (BALLOONS) ×2
BALLOON CHOCOLATE 5.0X80X120 (BALLOONS) IMPLANT
BALLOON STRLNG OTW 2.5X100X150 (BALLOONS) IMPLANT
CATH ANGIO 5F BER2 65CM (CATHETERS) IMPLANT
CATH ANGIO 5F PIGTAIL 65CM (CATHETERS) IMPLANT
CATH CROSS OVER TEMPO 5F (CATHETERS) IMPLANT
CATH HAWKONE LX EXTENDED TIP (CATHETERS) IMPLANT
CATH STRAIGHT 5FR 65CM (CATHETERS) IMPLANT
CATH VIANCE CROSS STAND 150CM (MICROCATHETER) ×2
CATH VIANCE CROSS STD 150CM (MICROCATHETER) IMPLANT
DCB IN.PACT 5X120 (BALLOONS) IMPLANT
DCB IN.PACT 5X150 (BALLOONS) IMPLANT
DEVICE SPIDERFX EMB PROT 6MM (WIRE) IMPLANT
KIT ENCORE 26 ADVANTAGE (KITS) IMPLANT
KIT PV (KITS) ×2 IMPLANT
SHEATH CATAPULT 7FR 45 (SHEATH) IMPLANT
SHEATH PINNACLE 5F 10CM (SHEATH) IMPLANT
SHEATH PINNACLE 7F 10CM (SHEATH) IMPLANT
SHEATH PROBE COVER 6X72 (BAG) IMPLANT
STOPCOCK MORSE 400PSI 3WAY (MISCELLANEOUS) IMPLANT
SYR MEDRAD MARK 7 150ML (SYRINGE) ×2 IMPLANT
TAPE SHOOT N SEE (TAPE) IMPLANT
TRANSDUCER W/STOPCOCK (MISCELLANEOUS) ×2 IMPLANT
TRAY PV CATH (CUSTOM PROCEDURE TRAY) ×2 IMPLANT
TUBING CIL FLEX 10 FLL-RA (TUBING) IMPLANT
WIRE HITORQ VERSACORE ST 145CM (WIRE) IMPLANT
WIRE ROSEN-J .035X180CM (WIRE) IMPLANT
WIRE SHEPHERD 6G .0144 (WIRE) IMPLANT

## 2023-04-16 NOTE — Progress Notes (Signed)
Site area: left femoral  Site Prior to Removal:  Level 0  Pressure Applied For: 65 minutes Manual:   yes Patient Status During Pull:  stable vitals, sore at site Post Pull Site:  Level 1. The hematoma developed during sheath pull and was expressed by the manual pressure. Post sheath pull the site was soft with no distinct ridges. Site marked with skin marker. Post Pull Instructions Given:  yes Post Pull Pulses Present: right DP doppler, and left PT with doppler Dressing Applied:  sterile gauze with tegaderm  Bedrest begins @ 16:45 Comments:

## 2023-04-16 NOTE — H&P (Signed)
04/16/2023 Alex Charles   [birth year]  [id number]   Primary Physician Ivonne Andrew, NP Primary Cardiologist: [name] [name], [place]   HPI:  Alex Charles is a 57 y.o.    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  He was referred by his primary cardiologist, Dr. Bjorn Pippin , for peripheral vascular evaluation and treatment.  I last saw him in the office 03/02/2023.  His cardiac risk factor profile is notable for ongoing tobacco abuse having smoked 40 pack years now smoking 8 cigarettes a day.  History of hypertension hyperlipidemia.  There is no family history for heart disease.  He has had a stent in his heart in [facility] April 2016.  Recent Myoview stress test was nonischemic.  He denies chest pain or shortness of breath.  He does have symmetric claudication of the last 5 to 7 years which is lifestyle limiting.  Recent Dopplers performed 05/21/2022 revealed ABIs in the 0.6 range with occluded SFAs bilaterally.    I began him on Pletal 50 mg a day and uptitrated to 100 mg twice daily.  He continues to have lifestyle-limiting claudication which is affecting him at work.  He wishes to proceed with an endovascular approach.  We did talk about lifestyle modification and he has agreed to stop smoking.   I performed peripheral angiography on him 03/02/2023 revealing bilateral SFA CTO's.  The left was flush occluded making it not percutaneously addressable on the right most likely could be fixed with endovascular techniques which she wishes to pursue.  He has stopped smoking.     Active Medications      Current Meds  Medication Sig   albuterol (VENTOLIN HFA) 108 (90 Base) MCG/ACT inhaler INHALE 2 PUFFS BY MOUTH EVERY 4 HOURS AS NEEDED FOR WHEEZE OR FOR SHORTNESS OF BREATH   aspirin EC 81 MG tablet Take 1 tablet (81 mg total) by mouth daily.   busPIRone (BUSPAR) 30 MG tablet Take 1  tablet (30 mg total) by mouth 2 (two) times daily.   cetirizine (ZYRTEC) 10 MG tablet TAKE 1 TABLET BY MOUTH EVERY DAY   diclofenac (VOLTAREN) 75 MG EC tablet TAKE 1 TABLET BY MOUTH 2 TIMES DAILY AS NEEDED.   diclofenac Sodium (VOLTAREN) 1 % GEL Apply 4 g topically 4 (four) times daily. Apply to affected areas 4 times daily as needed for pain.   ezetimibe (ZETIA) 10 MG tablet Take 1 tablet (10 mg total) by mouth daily.   fenofibrate 160 MG tablet TAKE 1 TABLET BY MOUTH EVERY DAY   hydrOXYzine (ATARAX) 10 MG tablet Take 1 tablet (10 mg total) by mouth 3 (three) times daily as needed.   metoprolol succinate (TOPROL-XL) 25 MG 24 hr tablet TAKE 1 TABLET (25 MG TOTAL) BY MOUTH DAILY.   nicotine (NICODERM CQ - DOSED IN MG/24 HR) 7 mg/24hr patch Place 1 patch (7 mg total) onto the skin daily.   nicotine polacrilex (NICORETTE) 2 MG gum Take 1 each (2 mg total) by mouth every 4 (four) hours while awake.   rosuvastatin (CRESTOR) 40 MG tablet TAKE 1 TABLET BY MOUTH EVERY DAY   traZODone (DESYREL) 50 MG tablet TAKE 1 TABLET BY MOUTH EVERY DAY AT BEDTIME AS NEEDED FOR SLEEP   varenicline (CHANTIX) 0.5 MG tablet Take 0.5 mg (1 tablet) daily for 3 days then take 0.5 mg (  1 tablet ) twice a day for 4 days then take 1 mg ( 2 tablets ) twice a day for a total of 12 weeks.        Allergies  No Known Allergies     Social History         Socioeconomic History   Marital status: Single      Spouse name: Not on file   Number of children: Not on file   Years of education: Not on file   Highest education level: Not on file  Occupational History   Not on file  Tobacco Use   Smoking status: Every Day      Current packs/day: 0.50      Types: Cigarettes   Smokeless tobacco: Never  Vaping Use   Vaping status: Never Used  Substance and Sexual Activity   Alcohol use: No   Drug use: Never   Sexual activity: Not Currently  Other Topics Concern   Not on file  Social History Narrative   Not on file     Social Determinants of Health    Financial Resource Strain: Not on file  Food Insecurity: Not on file  Transportation Needs: Not on file  Physical Activity: Not on file  Stress: Not on file  Social Connections: Not on file  Intimate Partner Violence: Not on file      Review of Systems: General: negative for chills, fever, night sweats or weight changes.  Cardiovascular: negative for chest pain, dyspnea on exertion, edema, orthopnea, palpitations, paroxysmal nocturnal dyspnea or shortness of breath Dermatological: negative for rash Respiratory: negative for cough or wheezing Urologic: negative for hematuria Abdominal: negative for nausea, vomiting, diarrhea, bright red blood per rectum, melena, or hematemesis Neurologic: negative for visual changes, syncope, or dizziness All other systems reviewed and are otherwise negative except as noted above.       Blood pressure 102/70, pulse (!) 59, height 5\' 8"  (1.727 m), weight 205 lb 3.2 oz (93.1 kg), SpO2 97%.  General appearance: alert and no distress Neck: no adenopathy, no carotid bruit, no JVD, supple, symmetrical, trachea midline, and thyroid not enlarged, symmetric, no tenderness/mass/nodules Lungs: clear to auscultation bilaterally Heart: regular rate and rhythm, S1, S2 normal, no murmur, click, rub or gallop Extremities: extremities normal, atraumatic, no cyanosis or edema Pulses: Diminished pedal pulses Skin: Skin color, texture, turgor normal. No rashes or lesions Neurologic: Grossly normal   EKG not performed today       ASSESSMENT AND PLAN:    Peripheral arterial disease [facility]) Mr. Shuey returns today for follow-up of his recent peripheral vascular angiogram which I performed 03/02/2023.  This revealed bilateral SFA CTO's.  His left SFA was flush occluded and therefore not percutaneously addressable.  His right SFA, which is his more affected leg, can probably be fit fixed with endovascular techniques which she wishes to  pursue at the end of this month.         Runell Gess, M.D., FACP, [facility], [facility] [address] [place], [place]  [address]  [phone] 04/16/2023 6:35 AM

## 2023-04-16 NOTE — Interval H&P Note (Signed)
History and Physical Interval Note:  04/16/2023 9:31 AM  Alex Charles  has presented today for surgery, with the diagnosis of pad.  The various methods of treatment have been discussed with the patient and family. After consideration of risks, benefits and other options for treatment, the patient has consented to  Procedure(s): ABDOMINAL AORTOGRAM W/LOWER EXTREMITY (N/A) as a surgical intervention.  The patient's history has been reviewed, patient examined, no change in status, stable for surgery.  I have reviewed the patient's chart and labs.  Questions were answered to the patient's satisfaction.     Nanetta Batty

## 2023-04-16 NOTE — Plan of Care (Signed)
CHL Tonsillectomy/Adenoidectomy, Postoperative PEDS care plan entered in error.

## 2023-04-17 ENCOUNTER — Encounter (HOSPITAL_COMMUNITY): Payer: Self-pay | Admitting: Cardiovascular Disease

## 2023-04-17 ENCOUNTER — Other Ambulatory Visit (HOSPITAL_COMMUNITY): Payer: Self-pay

## 2023-04-17 DIAGNOSIS — F17200 Nicotine dependence, unspecified, uncomplicated: Secondary | ICD-10-CM

## 2023-04-17 DIAGNOSIS — I739 Peripheral vascular disease, unspecified: Secondary | ICD-10-CM | POA: Diagnosis not present

## 2023-04-17 DIAGNOSIS — Z79899 Other long term (current) drug therapy: Secondary | ICD-10-CM | POA: Diagnosis not present

## 2023-04-17 DIAGNOSIS — E7800 Pure hypercholesterolemia, unspecified: Secondary | ICD-10-CM

## 2023-04-17 DIAGNOSIS — Z955 Presence of coronary angioplasty implant and graft: Secondary | ICD-10-CM | POA: Diagnosis not present

## 2023-04-17 DIAGNOSIS — I10 Essential (primary) hypertension: Secondary | ICD-10-CM | POA: Diagnosis not present

## 2023-04-17 DIAGNOSIS — Z7902 Long term (current) use of antithrombotics/antiplatelets: Secondary | ICD-10-CM | POA: Diagnosis not present

## 2023-04-17 DIAGNOSIS — E785 Hyperlipidemia, unspecified: Secondary | ICD-10-CM | POA: Diagnosis not present

## 2023-04-17 DIAGNOSIS — F17210 Nicotine dependence, cigarettes, uncomplicated: Secondary | ICD-10-CM | POA: Diagnosis not present

## 2023-04-17 LAB — BASIC METABOLIC PANEL
Anion gap: 9 (ref 5–15)
BUN: 13 mg/dL (ref 6–20)
CO2: 20 mmol/L — ABNORMAL LOW (ref 22–32)
Calcium: 8.7 mg/dL — ABNORMAL LOW (ref 8.9–10.3)
Chloride: 111 mmol/L (ref 98–111)
Creatinine, Ser: 1.02 mg/dL (ref 0.61–1.24)
GFR, Estimated: >60 mL/min (ref >60)
Glucose, Bld: 82 mg/dL (ref 70–99)
Potassium: 3.9 mmol/L (ref 3.5–5.1)
Sodium: 140 mmol/L (ref 135–145)

## 2023-04-17 LAB — LIPID PANEL
Cholesterol: 81 mg/dL (ref 0–200)
HDL: 30 mg/dL — ABNORMAL LOW (ref >40)
LDL Cholesterol: 26 mg/dL (ref 0–99)
Total CHOL/HDL Ratio: 2.7 {ratio}
Triglycerides: 125 mg/dL (ref <150)
VLDL: 25 mg/dL (ref 0–40)

## 2023-04-17 LAB — CBC
HCT: 40.3 % (ref 39.0–52.0)
Hemoglobin: 14.1 g/dL (ref 13.0–17.0)
MCH: 32 pg (ref 26.0–34.0)
MCHC: 35 g/dL (ref 30.0–36.0)
MCV: 91.4 fL (ref 80.0–100.0)
Platelets: 214 10*3/uL (ref 150–400)
RBC: 4.41 MIL/uL (ref 4.22–5.81)
RDW: 13.1 % (ref 11.5–15.5)
WBC: 7.7 10*3/uL (ref 4.0–10.5)
nRBC: 0 % (ref 0.0–0.2)

## 2023-04-17 MED ORDER — CLOPIDOGREL BISULFATE 75 MG PO TABS
75.0000 mg | ORAL_TABLET | Freq: Every day | ORAL | 3 refills | Status: DC
  Filled 2023-04-17: qty 34, 34d supply, fill #0

## 2023-04-17 NOTE — Progress Notes (Signed)
PHARMACIST LIPID MONITORING   Alex Charles is a 57 y.o. male admitted on 04/16/2023 with peripheral arterial disease.  Pharmacy has been consulted to optimize lipid-lowering therapy with the indication of secondary prevention for clinical ASCVD.  Recent Labs:  Lipid Panel (last 6 months):   Lab Results  Component Value Date   CHOL 81 04/17/2023   TRIG 125 04/17/2023   HDL 30 (L) 04/17/2023   CHOLHDL 2.7 04/17/2023   VLDL 25 04/17/2023   LDLCALC 26 04/17/2023    Hepatic function panel (last 6 months):   No results found for: "AST", "ALT", "ALKPHOS", "BILITOT", "BILIDIR", "IBILI"  SCr (since admission):   Serum creatinine: 1.02 mg/dL 16/10/96 0454 Estimated creatinine clearance: 87.7 mL/min  Current therapy and lipid therapy tolerance Current lipid-lowering therapy: rosuvastatin 40 mg, fenofibrate 160 mg, ezetimibe 10 mg  Previous lipid-lowering therapies (if applicable): rosuvastatin 20 mg, fenofibrate 145 mg, pravastatin 40 mg  Documented or reported allergies or intolerances to lipid-lowering therapies (if applicable): N/A  Assessment:   Patient currently receiving high intensity statin in addition to zetia with LDL well controlled.   Plan:    1.Statin intensity (high intensity recommended for all patients regardless of the LDL):  No statin changes. The patient is already on a high intensity statin.  2.Add ezetimibe (if any one of the following):   Not indicated at this time.  3.Refer to lipid clinic:   No  4.Follow-up with:  Cardiology provider - Little Ishikawa, MD  5.Follow-up labs after discharge:  No changes in lipid therapy, repeat a lipid panel in one year.     Thank you for allowing pharmacy to participate in this patient's care,  Sherron Monday, PharmD, BCCCP Clinical Pharmacist  Phone: [phone] 04/17/2023 9:24 AM  Please check AMION for all [facility] phone numbers After 10:00 PM, call Main Pharmacy [phone]

## 2023-04-17 NOTE — Plan of Care (Signed)

## 2023-04-17 NOTE — Discharge Summary (Signed)
Discharge Summary    Patient ID: Alex Charles MRN: [id number]; DOB: 1965-08-15  Admit date: 04/16/2023 Discharge date: 04/17/2023  PCP:  Ivonne Andrew, NP   [facility] Providers Cardiologist:  Little Ishikawa, MD  PV Cardiologist:  Nanetta Batty, MD  {  Discharge Diagnoses    Principal Problem:   Claudication in peripheral vascular disease [facility]) Active Problems:   Hyperlipidemia   Tobacco dependence    Diagnostic Studies/Procedures    Abdominal aortogram  Procedures Performed:             1.  Ultrasound-guided left common femoral access             2.  Contralateral access (second-order cath placement)             3.  PTA right SFA CTO             4.  Placement of a spider distal protection device in the right below the knee popliteal artery             5.  Hawk 1 directional atherectomy followed by [facility] mid right SFA CTO after initially performing PTA             6.  Chocolate balloon angioplasty followed by DCB proximal right SFA  IMPRESSION: Successful Hawk 1 directional atherectomy followed by [facility] of a right SFA CTO as well as significant obstructive segmental disease in the proximal right SFA.  The patient tolerated procedure well.  The sheath will be removed once ACT falls below 170 pressure held.  He will be hydrated overnight and discharged home in the morning on DAPT.  Will obtain lower extremity arterial Doppler studies in unrefined office next week and I will see him back 1 to 2 weeks thereafter.  He left the lab in stable condition.  _____________   History of Present Illness     Alex Charles is a 57 y.o. male with PAD with bilateral SFA CTOs, tobacco abuse, HTN an dHLD. His right SFA felt amenable to percutaneous intervention and was planned for repeat abdominal aortogram.   Hospital Course     Consultants: none  PAD with lifestyle limiting claudication Right SFA CTO treated with directional atherectomy and chocolate balloon  angioplasty with minx closure.  Pt tolerated the procedure well and ambulated in the hall. Discharged on ASA and plavix. Will hold pletal.   Hypertension Continue 25 mg toprol.   Hyperlipidemia with LDL goal < 70 04/17/2023: Cholesterol 81; HDL 30; LDL Cholesterol 26; Triglycerides 125; VLDL 25 Continue 40 mg crestor, zetia, and fenofibrate.    Tobacco abuse Cessation recommended   Pt seen and examined by myself and Dr. Jacinto Halim and felt stable for discharge. Follow up has been arranged.      Did the patient have an acute coronary syndrome (MI, NSTEMI, STEMI, etc) this admission?:  No                               Did the patient have a percutaneous coronary intervention (stent / angioplasty)?:  No.        The patient will be scheduled for a TOC follow up appointment in 7-14 days.  A message has been sent to the [facility] and Scheduling Pool at the office where the patient should be seen for follow up.  _____________  Discharge Vitals Blood pressure 130/87, pulse 61, temperature 98.6 F (37 C), temperature source  Oral, resp. rate 16, height 5\' 8"  (1.727 m), weight 96.2 kg, SpO2 100%.  Filed Weights   04/16/23 0807  Weight: 96.2 kg    Physical Exam Constitutional:      Appearance: Normal appearance.  Eyes:     Extraocular Movements: Extraocular movements intact.  Neck:     Vascular: No carotid bruit.  Cardiovascular:     Rate and Rhythm: Normal rate and regular rhythm.  Pulmonary:     Effort: Pulmonary effort is normal.     Breath sounds: Normal breath sounds.  Abdominal:     General: Abdomen is flat.     Palpations: Abdomen is soft.  Musculoskeletal:     Right lower leg: No edema.     Left lower leg: No edema.  Skin:    General: Skin is warm and dry.  Neurological:     Mental Status: He is alert and oriented to person, place, and time.  Psychiatric:        Mood and Affect: Mood normal.        Behavior: Behavior normal.   Left groin without hematoma   Labs  & Radiologic Studies    CBC Recent Labs    04/17/23 0605  WBC 7.7  HGB 14.1  HCT 40.3  MCV 91.4  PLT 214   Basic Metabolic Panel Recent Labs    16/10/96 0605  NA 140  K 3.9  CL 111  CO2 20*  GLUCOSE 82  BUN 13  CREATININE 1.02  CALCIUM 8.7*   Liver Function Tests No results for input(s): "AST", "ALT", "ALKPHOS", "BILITOT", "PROT", "ALBUMIN" in the last 72 hours. No results for input(s): "LIPASE", "AMYLASE" in the last 72 hours. High Sensitivity Troponin:   No results for input(s): "TROPONINIHS" in the last 720 hours.  BNP Invalid input(s): "POCBNP" D-Dimer No results for input(s): "DDIMER" in the last 72 hours. Hemoglobin A1C No results for input(s): "HGBA1C" in the last 72 hours. Fasting Lipid Panel Recent Labs    04/17/23 0605  CHOL 81  HDL 30*  LDLCALC 26  TRIG 045  CHOLHDL 2.7   Thyroid Function Tests No results for input(s): "TSH", "T4TOTAL", "T3FREE", "THYROIDAB" in the last 72 hours.  Invalid input(s): "FREET3" _____________  PERIPHERAL VASCULAR CATHETERIZATION  Result Date: 04/16/2023 Images from the original result were not included.  [id number] LOCATION:  FACILITY: [facility] PHYSICIAN: Nanetta Batty, M.D. [birth year] DATE OF PROCEDURE:  04/16/2023 DATE OF DISCHARGE: PV Angiogram/Intervention History obtained from chart review.Alex Charles is a 57 y.o.    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  He was referred by his primary cardiologist, Dr. Bjorn Pippin , for peripheral vascular evaluation and treatment.  I last saw him in the office 03/02/2023.  His cardiac risk factor profile is notable for ongoing tobacco abuse having smoked 40 pack years now smoking 8 cigarettes a day.  History of hypertension hyperlipidemia.  There is no family history for heart disease.  He has had a stent in his heart in [facility] April 2016.  Recent Myoview stress test was nonischemic.  He denies  chest pain or shortness of breath.  He does have symmetric claudication of the last 5 to 7 years which is lifestyle limiting.  Recent Dopplers performed 05/21/2022 revealed ABIs in the 0.6 range with occluded SFAs bilaterally.  I began him on Pletal 50 mg a day and uptitrated to 100 mg twice daily.  He continues  to have lifestyle-limiting claudication which is affecting him at work.  He wishes to proceed with an endovascular approach.  We did talk about lifestyle modification and he has agreed to stop smoking.  I performed peripheral angiography on him 03/02/2023 revealing bilateral SFA CTO's.  The left was flush occluded making it not percutaneously addressable on the right most likely could be fixed with endovascular techniques which she wishes to pursue.  He has stopped smoking. Pre Procedure Diagnosis: Peripheral arterial disease Post Procedure Diagnosis: Peripheral arterial disease Operators: Dr. Nanetta Batty Procedures Performed:  1.  Ultrasound-guided left common femoral access  2.  Contralateral access (second-order cath placement)  3.  PTA right SFA CTO  4.  Placement of a spider distal protection device in the right below the knee popliteal artery             5.  Hawk 1 directional atherectomy followed by [facility] mid right SFA CTO after initially performing PTA             6.  Chocolate balloon angioplasty followed by [facility] proximal right SFA PROCEDURE DESCRIPTION: The patient was brought to the second floor [facility] cath lab in the the postabsorptive state. He was premedicated with IV Versed and fentanyl. His left groin was prepped and shaved in usual sterile fashion. Xylocaine 1% was used for local anesthesia. A 5 French sheath was inserted into the left common femoral artery using standard Seldinger technique.  A 7 French 45 cm catapult sheath was then advanced over the iliac bifurcation into the right common femoral artery over a 035 Rosen wire (second-order cath placement).  The patient received  18,000 units of heparin with an ending ACT of 269.  Total contrast used during the case was 100 cc. Following this I crossed the CTO with a 0.14/6 g shepherd wire preloaded into a Viance CTO catheter.  I then performed PTA of the right SFA CTO with a 2.5 mm x 100 mm long Sterling balloon.  I then placed a 6 mm spider distal protection device in the below the knee right popliteal artery.  I performed directional atherectomy of the diseased segment in the mid right SFA with a Hawk 1 directional atherectomy device removing a significant amount of atherosclerotic plaque.  I then performed DCB with a 5 mm x 120 mm long Impact Admiral Balloon at nominal pressures for 3 minutes. I did not focus my attention on the proximal segmental disease in the right SFA.  I performed chocolate balloon angioplasty with a 5 mm x 80 mm long chocolate balloon followed by [facility] with a 5 mm x 120 mm long Impact Admiral Balloon at nominal pressures for 3 minutes.  Final result was reduction of the mid right SFA CTO to less than 20% residual without dissection.  There was reduction of a segmental proximal 80% stenosis in the right SFA to less than 20% residual.  I then retrieved the spider distal protection device which had captured some plaque and exchanged the catapult catheter over an 035 versa core wire for a short 7 French sheath which was secured in place.  The patient received 300 mg of clopidogrel at the end of the case.  The sheath was then secured clearly in place.    Successful Hawk 1 directional atherectomy followed by [employer] of a right SFA CTO as well as significant obstructive segmental disease in the proximal right SFA.  The patient tolerated procedure well.  The sheath will be removed once ACT  falls below 170 pressure held.  He will be hydrated overnight and discharged home in the morning on DAPT.  Will obtain lower extremity arterial Doppler studies in unrefined office next week and I will see him back 1 to 2 weeks thereafter.   He left the lab in stable condition. Nanetta Batty. MD, [facility] 04/16/2023 11:57 AM    Disposition   Pt is being discharged home today in good condition.  Follow-up Plans & Appointments        Discharge Medications   Allergies as of 04/17/2023   No Known Allergies      Medication List     STOP taking these medications    cilostazol 100 MG tablet Commonly known as: PLETAL   diclofenac 75 MG EC tablet Commonly known as: VOLTAREN       TAKE these medications    albuterol 108 (90 Base) MCG/ACT inhaler Commonly known as: VENTOLIN HFA INHALE 2 PUFFS BY MOUTH EVERY 4 HOURS AS NEEDED FOR WHEEZE OR FOR SHORTNESS OF BREATH   aspirin EC 81 MG tablet Take 1 tablet (81 mg total) by mouth daily.   busPIRone 30 MG tablet Commonly known as: BUSPAR Take 1 tablet (30 mg total) by mouth 2 (two) times daily.   carboxymethylcellulose 0.5 % Soln Commonly known as: REFRESH PLUS 1 drop 3 (three) times daily as needed (dry/irritated eyes.).   cetirizine 10 MG tablet Commonly known as: ZYRTEC TAKE 1 TABLET BY MOUTH EVERY DAY   clopidogrel 75 MG tablet Commonly known as: PLAVIX Take 1 tablet (75 mg total) by mouth daily with breakfast. Start taking on: April 18, 2023   diclofenac Sodium 1 % Gel Commonly known as: VOLTAREN Apply 4 g topically 4 (four) times daily. Apply to affected areas 4 times daily as needed for pain.   ezetimibe 10 MG tablet Commonly known as: ZETIA Take 1 tablet (10 mg total) by mouth daily.   fenofibrate 160 MG tablet TAKE 1 TABLET BY MOUTH EVERY DAY   hydrOXYzine 10 MG tablet Commonly known as: ATARAX Take 1 tablet (10 mg total) by mouth 3 (three) times daily as needed.   metoprolol succinate 25 MG 24 hr tablet Commonly known as: TOPROL-XL TAKE 1 TABLET (25 MG TOTAL) BY MOUTH DAILY.   rosuvastatin 40 MG tablet Commonly known as: CRESTOR TAKE 1 TABLET BY MOUTH EVERY DAY   varenicline 0.5 MG tablet Commonly known as: Chantix Take 0.5 mg (1  tablet) daily for 3 days then take 0.5 mg ( 1 tablet ) twice a day for 4 days then take 1 mg ( 2 tablets ) twice a day for a total of 12 weeks. What changed:  how much to take how to take this when to take this additional instructions   VITAMIN B-12 PO Take 1 tablet by mouth daily as needed (energy).           Outstanding Labs/Studies     Duration of Discharge Encounter   Greater than 30 minutes including physician time.  Signed, Roe Rutherford Jewelia Bocchino, PA 04/17/2023, 9:48 AM

## 2023-04-17 NOTE — Discharge Instructions (Signed)

## 2023-04-21 ENCOUNTER — Other Ambulatory Visit: Payer: Self-pay | Admitting: Cardiology

## 2023-04-22 ENCOUNTER — Ambulatory Visit: Payer: BC Managed Care – PPO

## 2023-04-22 DIAGNOSIS — Z0000 Encounter for general adult medical examination without abnormal findings: Secondary | ICD-10-CM

## 2023-04-22 NOTE — Progress Notes (Unsigned)
Appointment Outcome: Completed, Session #: 5                        Start time: 10:01am   End time: 10:33am   Total Mins: 32 minutes  AGREEMENTS SECTION   Overall Goal(s): Smoking cessation      Stress management                                            Agreement/Action Steps:  Smoking Cessation Minimize use of e-cigarette to 10-20 puffs per day Do not buy a refill cartridge after e-cigarette is empty Practice deep breathing Eat real fruit popsicles Drink iced water/Gatorade when cravings occur Remove self from around others when they smoke Utilize support system   Stress management Practice the 5-4-3-2-1 method when feeling anxious Pre-plan daily schedule/routine Practice deep breathing for 2 mins Meditate for 5 mins Write in journal (e.g., while drinking morning coffee, before bed)   Progress Notes:  Patient reported that he was able to go without using the e-cigarette starting 10/23 for one week. Patient explained that the thought of having to return to work among some additional stressors drove him into crisis mode and his automatic response without thinking was smoking. Patient shared that during the week when he did not vape, he was able to be around others that smoke and was not tempted. Patient shared that there were other times that he wanted to vape but was able to push through the urge. Patient mentioned that during those time he would enact his action steps and talk to himself.   Patient shared that he as only taken 2 to 3 puffs today and do not foresee himself vaping the rest of the day because he is focused on tasks. Patient stated that the most challenging time for him to refrain from smoking is in the morning. Patient stated that he needs to engage in a routine that will offset the anxiety that arise from the anticipating of going to work. Patient stated that deep breathing was helpful in getting him through pain and moments when he had an urge to smoke.   Discussed  with patient how practicing deep breathing and mediation as planned in last session was still a strategy he wanted to put into place as a morning routine to help him reduce stress and deter him from using the e-cigarette. Patient stated that he believes this strategy will be helpful.     Indicators of Success and Accountability:  Patient was able to refrain from using the e-cigarette for one week or return to smoking a physical cigarette over the past two weeks.   Readiness: Patient is in the action stage of smoking cessation and stress management.   Strengths and Supports: Patient is being supported by his family. Patient is relying on being strategic and task oriented.  Challenges and Barriers: Patient stated that the morning is the most challenging time to refrain from smoking especially when preparing for work due to anxiety.    Coaching Outcomes: Patient will continue to implement his action steps as outlined above over the next two weeks.   Patient will focus on practicing deep breathing, meditating, and journaling as a routine in the morning to cope with stress and to refrain from using the e-cigarette.     Attempted: Fulfilled - Patient completed the bi-weekly agreement in full  and was able to meet the challenge.

## 2023-04-24 ENCOUNTER — Other Ambulatory Visit: Payer: Self-pay | Admitting: Cardiology

## 2023-04-24 ENCOUNTER — Other Ambulatory Visit: Payer: Self-pay | Admitting: Nurse Practitioner

## 2023-04-29 ENCOUNTER — Ambulatory Visit (HOSPITAL_COMMUNITY)
Admission: RE | Admit: 2023-04-29 | Discharge: 2023-04-29 | Disposition: A | Discharge status: Home / Self Care | Payer: BC Managed Care – PPO | Source: Ambulatory Visit | Attending: Cardiovascular Disease | Admitting: Cardiovascular Disease

## 2023-04-29 DIAGNOSIS — I739 Peripheral vascular disease, unspecified: Secondary | ICD-10-CM | POA: Insufficient documentation

## 2023-04-29 LAB — VAS US ABI WITH/WO TBI
Left ABI: 0.74
Right ABI: 0.83

## 2023-04-30 ENCOUNTER — Other Ambulatory Visit (HOSPITAL_COMMUNITY): Payer: Self-pay | Admitting: *Deleted

## 2023-04-30 DIAGNOSIS — I739 Peripheral vascular disease, unspecified: Secondary | ICD-10-CM

## 2023-05-02 ENCOUNTER — Other Ambulatory Visit: Payer: Self-pay | Admitting: Nurse Practitioner

## 2023-05-04 ENCOUNTER — Other Ambulatory Visit: Payer: Self-pay | Admitting: Cardiology

## 2023-05-05 ENCOUNTER — Telehealth: Payer: Self-pay | Admitting: Licensed Clinical Social Worker

## 2023-05-05 MED ORDER — VARENICLINE TARTRATE 1 MG PO TABS: 1.0000 mg | ORAL_TABLET | Freq: Two times a day (BID) | ORAL | 1 refills | Status: AC

## 2023-05-05 NOTE — Telephone Encounter (Signed)
H&V Care Navigation CSW Progress Note  Clinical Social Worker Clinical Social Worker contacted patient by phone to update her that Health Coach is out of the office and will call her to reschedule upon her return. Was unable to leave voicemail at [phone] so I sent pt a mychart message to provide update. Requested patient access team assist with cancelling appt to avoid no show.   Patient is participating in a Managed Medicaid Plan:  No, BCBS commercial plan only  SDOH Screenings   Depression (PHQ2-9): Medium Risk (01/14/2023)  Tobacco Use: High Risk (03/26/2023)    Octavio Graves, MSW, LCSW Clinical Social Worker II [facility] Heart/Vascular Care Navigation  [phone]- work cell phone (preferred) [phone]- desk phone

## 2023-05-05 NOTE — Addendum Note (Signed)
Addended by: Neoma Laming on: 05/05/2023 11:05 AM   Modules accepted: Orders

## 2023-05-06 ENCOUNTER — Ambulatory Visit: Payer: BC Managed Care – PPO

## 2023-05-06 ENCOUNTER — Ambulatory Visit: Payer: BC Managed Care – PPO | Attending: Cardiovascular Disease | Admitting: Cardiovascular Disease

## 2023-05-06 ENCOUNTER — Encounter: Payer: Self-pay | Admitting: Cardiovascular Disease

## 2023-05-06 VITALS — BP 100/64 | HR 72 | Ht 68.0 in | Wt 213.0 lb

## 2023-05-06 DIAGNOSIS — F17200 Nicotine dependence, unspecified, uncomplicated: Secondary | ICD-10-CM | POA: Diagnosis not present

## 2023-05-06 DIAGNOSIS — I739 Peripheral vascular disease, unspecified: Secondary | ICD-10-CM

## 2023-05-06 MED ORDER — CILOSTAZOL 100 MG PO TABS: 100.0000 mg | ORAL_TABLET | Freq: Two times a day (BID) | ORAL | 3 refills | Status: DC

## 2023-05-06 NOTE — Progress Notes (Signed)
05/06/2023 Alex Charles   1965/12/23  [id number]  Primary Physician Alex Andrew, NP Primary Cardiologist: Alex Gess MD Alex Charles, [place]  HPI:  Alex Charles is a 57 y.o.   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  He was referred by his primary cardiologist, Alex Charles , for peripheral vascular evaluation and treatment.  I last saw him in the office 04/12/2023.  His cardiac risk factor profile is notable for ongoing tobacco abuse having smoked 40 pack years now smoking 8 cigarettes a day.  History of hypertension hyperlipidemia.  There is no family history for heart disease.  He has had a stent in his heart in [facility] April 2016.  Recent Myoview stress test was nonischemic.  He denies chest pain or shortness of breath.  He does have symmetric claudication of the last 5 to 7 years which is lifestyle limiting.  Recent Dopplers performed 05/21/2022 revealed ABIs in the 0.6 range with occluded SFAs bilaterally.    I began him on Pletal 50 mg a day and uptitrated to 100 mg twice daily.  He continues to have lifestyle-limiting claudication which is affecting him at work.  He wishes to proceed with an endovascular approach.  We did talk about lifestyle modification and he has agreed to stop smoking.   I performed peripheral angiography on him 03/02/2023 revealing bilateral SFA CTO's.  The left was flush occluded making it not percutaneously addressable on the right most likely could be fixed with endovascular techniques which she wishes to pursue.  He has stopped smoking.  I performed percutaneous intervention on his right SFA CTO 04/12/2023 using directional arthrectomy and drug-coated balloon angioplasty with excellent result.  His Dopplers have markedly improved and his claudication has resolved on both sides.  He is on cilostazol 50 mg p.o. twice daily.   Current Meds  Medication Sig    albuterol (VENTOLIN HFA) 108 (90 Base) MCG/ACT inhaler INHALE 2 PUFFS BY MOUTH EVERY 4 HOURS AS NEEDED FOR WHEEZE OR FOR SHORTNESS OF BREATH   aspirin EC 81 MG tablet Take 1 tablet (81 mg total) by mouth daily.   busPIRone (BUSPAR) 30 MG tablet TAKE 1 TABLET BY MOUTH 2 TIMES DAILY.   carboxymethylcellulose (REFRESH PLUS) 0.5 % SOLN 1 drop 3 (three) times daily as needed (dry/irritated eyes.).   cetirizine (ZYRTEC) 10 MG tablet TAKE 1 TABLET BY MOUTH EVERY DAY   clopidogrel (PLAVIX) 75 MG tablet Take 1 tablet (75 mg total) by mouth daily with breakfast.   Cyanocobalamin (VITAMIN B-12 PO) Take 1 tablet by mouth daily as needed (energy).   diclofenac Sodium (VOLTAREN) 1 % GEL Apply 4 g topically 4 (four) times daily. Apply to affected areas 4 times daily as needed for pain.   ezetimibe (ZETIA) 10 MG tablet TAKE 1 TABLET BY MOUTH EVERY DAY   fenofibrate 160 MG tablet TAKE 1 TABLET BY MOUTH EVERY DAY   hydrOXYzine (ATARAX) 10 MG tablet Take 1 tablet (10 mg total) by mouth 3 (three) times daily as needed.   metoprolol succinate (TOPROL-XL) 25 MG 24 hr tablet TAKE 1 TABLET (25 MG TOTAL) BY MOUTH DAILY.   rosuvastatin (CRESTOR) 40 MG tablet TAKE 1 TABLET BY MOUTH EVERY DAY   varenicline (CHANTIX CONTINUING MONTH PAK) 1 MG tablet Take 1 tablet (1 mg total) by mouth 2 (two) times daily.   varenicline (CHANTIX) 1 MG tablet Take  1 tablet (1 mg total) by mouth 2 (two) times daily.     No Known Allergies  Social History   Socioeconomic History   Marital status: Single    Spouse name: Not on file   Number of children: Not on file   Years of education: Not on file   Highest education level: Not on file  Occupational History   Not on file  Tobacco Use   Smoking status: Some Days    Current packs/day: 0.15    Average packs/day: 0.2 packs/day for 0.1 years    Types: Cigarettes    Start date: 03/25/2023   Smokeless tobacco: Never   Tobacco comments:    Patient is participating in health coaching  for smoking cessation.   Vaping Use   Vaping status: Never Used  Substance and Sexual Activity   Alcohol use: No   Drug use: Never   Sexual activity: Not Currently  Other Topics Concern   Not on file  Social History Narrative   Not on file   Social Determinants of Health   Financial Resource Strain: Not on file  Food Insecurity: Not on file  Transportation Needs: Not on file  Physical Activity: Not on file  Stress: Not on file  Social Connections: Not on file  Intimate Partner Violence: Not on file     Review of Systems: General: negative for chills, fever, night sweats or weight changes.  Cardiovascular: negative for chest pain, dyspnea on exertion, edema, orthopnea, palpitations, paroxysmal nocturnal dyspnea or shortness of breath Dermatological: negative for rash Respiratory: negative for cough or wheezing Urologic: negative for hematuria Abdominal: negative for nausea, vomiting, diarrhea, bright red blood per rectum, melena, or hematemesis Neurologic: negative for visual changes, syncope, or dizziness All other systems reviewed and are otherwise negative except as noted above.    Blood pressure 100/64, pulse 72, height 5\' 8"  (1.727 m), weight 213 lb (96.6 kg), SpO2 96%.  General appearance: alert and no distress Neck: no adenopathy, no carotid bruit, no JVD, supple, symmetrical, trachea midline, and thyroid not enlarged, symmetric, no tenderness/mass/nodules Lungs: clear to auscultation bilaterally Heart: regular rate and rhythm, S1, S2 normal, no murmur, click, rub or gallop Extremities: extremities normal, atraumatic, no cyanosis or edema Pulses: 2+ and symmetric Skin: Skin color, texture, turgor normal. No rashes or lesions Neurologic: Grossly normal  EKG not performed today      ASSESSMENT AND PLAN:   Tobacco dependence Stop smoking approximonth ago at my request  Peripheral arterial disease [facility]) Alex Charles returns today after having undergone  percutaneous intervention of his right SFA CTO 04/16/2023.  He did have a left SFA CTO that was not percutaneously revascularizable because of occlusion at the origin.  He is on Pletal 50 mg p.o. twice daily.  His claudication is completely resolved and his Dopplers have improved by duplex ultrasound 04/29/2023.  He still has a bruised on the medial aspect of his left thigh which will resolve.  We will repeat Doppler studies in 6 months and will see him back in 1 year for follow-up.     Alex Gess MD FACP,FACC,FAHA, [facility] 05/06/2023 9:55 AM

## 2023-05-06 NOTE — Assessment & Plan Note (Signed)
Mr. Arnette returns today after having undergone percutaneous intervention of his right SFA CTO 04/16/2023.  He did have a left SFA CTO that was not percutaneously revascularizable because of occlusion at the origin.  He is on Pletal 50 mg p.o. twice daily.  His claudication is completely resolved and his Dopplers have improved by duplex ultrasound 04/29/2023.  He still has a bruised on the medial aspect of his left thigh which will resolve.  We will repeat Doppler studies in 6 months and will see him back in 1 year for follow-up.

## 2023-05-06 NOTE — Assessment & Plan Note (Signed)
Stop smoking approximonth ago at my request

## 2023-05-06 NOTE — Patient Instructions (Addendum)
Testing/Procedures:   Your physician has requested that you have a lower extremity arterial duplex in 6 months. During this test, ultrasound is used to evaluate arterial blood flow in the legs. Allow one hour for this exam. There are no restrictions or special instructions. This will take place at [address], [address].  Please note: We ask at that you not bring children with you during ultrasound (echo/ vascular) testing. Due to room size and safety concerns, children are not allowed in the ultrasound rooms during exams. Our front office staff cannot provide observation of children in our lobby area while testing is being conducted. An adult accompanying a patient to their appointment will only be allowed in the ultrasound room at the discretion of the ultrasound technician under special circumstances. We apologize for any inconvenience.   Your physician has requested that you have an ankle brachial index (ABI) due in 6 months. During this test an ultrasound and blood pressure cuff are used to evaluate the arteries that supply the arms and legs with blood. Allow thirty minutes for this exam. There are no restrictions or special instructions. This will take place at [address] [facility] [facility], [address].    Please note: We ask at that you not bring children with you during ultrasound (echo/ vascular) testing. Due to room size and safety concerns, children are not allowed in the ultrasound rooms during exams. Our front office staff cannot provide observation of children in our lobby area while testing is being conducted. An adult accompanying a patient to their appointment will only be allowed in the ultrasound room at the discretion of the ultrasound technician under special circumstances. We apologize for any inconvenience.  Follow-Up: At [facility], you and your health needs are our priority.  As part of our continuing mission to provide you with exceptional heart care, we have created  designated Provider Care Teams.  These Care Teams include your primary Cardiologist (physician) and Advanced Practice Providers (APPs -  Physician Assistants and Nurse Practitioners) who all work together to provide you with the care you need, when you need it.  We recommend signing up for the patient portal called "MyChart".  Sign up information is provided on this After Visit Summary.  MyChart is used to connect with patients for Virtual Visits (Telemedicine).  Patients are able to view lab/test results, encounter notes, upcoming appointments, etc.  Non-urgent messages can be sent to your provider as well.   To learn more about what you can do with MyChart, go to [email or web address].    Your next appointment:   12 month(s)  Provider:   Nanetta Batty MD

## 2023-05-13 ENCOUNTER — Telehealth (HOSPITAL_BASED_OUTPATIENT_CLINIC_OR_DEPARTMENT_OTHER): Payer: Self-pay

## 2023-05-13 DIAGNOSIS — Z0000 Encounter for general adult medical examination without abnormal findings: Secondary | ICD-10-CM

## 2023-05-13 NOTE — Telephone Encounter (Signed)
Patient responded via email that he wanted to rescheduled his health coaching appointment for 11/21 at 10:00am. Patient has been rescheduled as requested for a telephone appointment.    Renaee Munda, MS, ERHD, [facility]  Care Guide, Health & Wellness Coach [address] [place] [address] Telephone: [phone] Email: [email or web address]

## 2023-05-14 ENCOUNTER — Ambulatory Visit: Payer: BC Managed Care – PPO

## 2023-05-14 DIAGNOSIS — Z0000 Encounter for general adult medical examination without abnormal findings: Secondary | ICD-10-CM

## 2023-05-14 NOTE — Progress Notes (Signed)
Appointment Outcome: Completed, Session #: 6                       Start time: 10:01am   End time: 10:29am   Total Mins: 28 minutes  AGREEMENTS SECTION  Overall Goal(s): Smoking cessation      Stress management                                            Agreement/Action Steps:  Smoking Cessation Minimize use of e-cigarette to 10-20 puffs per day Do not buy a refill cartridge after e-cigarette is empty Practice deep breathing Eat real fruit popsicles Drink iced water/Gatorade when cravings occur Remove self from around others when they smoke Utilize support system   Stress management Practice the 5-4-3-2-1 method when feeling anxious Pre-plan daily schedule/routine Practice deep breathing for 2 mins Meditate for 5 mins Write in journal (e.g., while drinking morning coffee, before bed)    Progress Notes:  Patient reported that he was able to stop using the e-cigarette on 05/04/2023. Patient stated that he does not have an urge to smoke a physical cigarette and has been able to refrain from vaping by using the strategies to manage stress/anxiety and to deter smoking. Patient shared that when the urge to smoke arises, he relies on drinking or eating something cold like water or a real fruit popsicle. Patient stated that it also helps with reduce anxiety, which was the main trigger for smoking. Patient stated that he has been engaging in his morning routine of practicing deep breathing, meditating, and journaling to help him manage stress. Patient stated that working through this process helps him to redirect his focus and manage the urges to smoke.   Patient reported that health coaching was the most helpful due to the accountability and the commitment he made because he wanted to get better. Patient shared that going through this process and using the techniques has been transferable to other aspects of his life and being able to look at situations. Patient reported being able to use his  stress management techniques to deescalate situations that normally would increase his anxiety.   Indicators of Success and Accountability:  Patient reported being smoke free as of 05/04/23.  Readiness: Patient is in the action stage of smoking cessation and stress management.   Strengths and Supports: Patient stated that his strengths are being strategic and patient.  Challenges and Barriers: Patient does not foresee any challenges to implementing action steps to maintain being smoke free and managing stress/anxiety.   Coaching Outcomes: Patient stated that he would like to maintain his action steps that he has incorporated to quit smoking and to manage stress/anxiety. Patient stated that he plans to elevate the use of his strategies by combining them to be more effective in managing stress/anxiety.    Overall Goal(s): Smoking cessation: Quit Date (05/04/2023)     Stress management                                            Agreement/Action Steps:  Smoking Cessation Maintain not smoking a cigarette or using an e-cigarette Practice deep breathing Eat real fruit popsicles Drink iced water/Gatorade when cravings occur Remove self from around others when they smoke Utilize support  system   Stress management Practice the 5-4-3-2-1 method when feeling anxious Pre-plan daily schedule/routine Practice deep breathing for 2 mins Meditate for 5 mins Write in journal (e.g., while drinking morning coffee, before bed)   Patient has completed his 6-biweekly sessions and has entered the follow-up phase for maintenance.   Attempted: Fulfilled - Patient completed the bi-weekly agreement in full and was able to meet the challenge.

## 2023-05-19 ENCOUNTER — Other Ambulatory Visit (HOSPITAL_COMMUNITY): Payer: Self-pay

## 2023-06-10 ENCOUNTER — Ambulatory Visit (HOSPITAL_BASED_OUTPATIENT_CLINIC_OR_DEPARTMENT_OTHER): Payer: BC Managed Care – PPO

## 2023-06-10 DIAGNOSIS — Z0000 Encounter for general adult medical examination without abnormal findings: Secondary | ICD-10-CM

## 2023-06-10 NOTE — Progress Notes (Signed)
Appointment Outcome: Completed, Session #: 5-month f/u                        Start time: 10:01am   End time: 10:38am  Total Mins: 37 minute  AGREEMENTS SECTION   Overall Goal(s): Smoking cessation: Quit Date (05/04/2023)     Stress management                                            Agreement/Action Steps:  Smoking Cessation Maintain not smoking a cigarette or using an e-cigarette Practice deep breathing Eat real fruit popsicles Drink iced water/Gatorade when cravings occur Remove self from around others when they smoke Utilize support system   Stress management Practice the 5-4-3-2-1 method when feeling anxious Pre-plan daily schedule/routine Practice deep breathing for 2 mins Meditate for 5 mins Write in journal (e.g., while drinking morning coffee, before bed)    Progress Notes:  Patient reported that he has continued to remain nicotine/smoke free since his last health coaching session. Patient shared that things are going well even though the thought of smoking creeps up sometimes. Patient stated that he is able to work through those moments by practicing deep breathing. Patient expressed how this psyches out his mind and he is able to calm himself when anxious and when the urge to smoke arises. Patient shared that he has to deliberately put himself in a relaxed mental space to manage stress and remaining nicotine/smoke free. Patient declared that he made it and achieved his goal.   Patient expressed that now he is focusing more on managing his stress and provided an example of a current stressor. Patient mentioned that in addition to his current stress management strategies, he is planning to incorporate more of his faith and spiritual beliefs to aid him in reducing stress. Patient also shared how he has work on communicating with others, and being assertive in certain incidences. Patient shared that he identifies he has the ability to be flexible, willing to learn, and go with  the flow of things.   Indicators of Success and Accountability:  Patient has remained nicotine/smoke free since 05/04/2023.  Readiness: Patient is in the action stage of smoking cessation and stress management.  Strengths and Supports: Patient is relying on being flexible, assertive, and intentional with implementing his action steps and when interacting with others.   Challenges and Barriers: N/A   Coaching Outcomes: Patient did not modify his action steps as outlined above during this health coaching appointment.  Patient will continue implementing over the next two months.     Attempted: Fulfilled - Patient completed the monthly agreement in full and was able to meet the challenge.

## 2023-06-25 ENCOUNTER — Other Ambulatory Visit: Payer: Self-pay | Admitting: Nurse Practitioner

## 2023-06-25 MED ORDER — ALBUTEROL SULFATE HFA 108 (90 BASE) MCG/ACT IN AERS: 2.0000 | INHALATION_SPRAY | Freq: Four times a day (QID) | RESPIRATORY_TRACT | 0 refills | Status: DC | PRN

## 2023-07-17 ENCOUNTER — Other Ambulatory Visit: Payer: Self-pay | Admitting: Nurse Practitioner

## 2023-07-17 NOTE — Telephone Encounter (Signed)
Please advise [facility]

## 2023-07-21 ENCOUNTER — Other Ambulatory Visit: Payer: Self-pay | Admitting: Nurse Practitioner

## 2023-07-22 ENCOUNTER — Encounter: Payer: Self-pay | Admitting: Nurse Practitioner

## 2023-07-22 ENCOUNTER — Ambulatory Visit (INDEPENDENT_AMBULATORY_CARE_PROVIDER_SITE_OTHER): Payer: BC Managed Care – PPO | Admitting: Nurse Practitioner

## 2023-07-22 VITALS — BP 114/81 | HR 66 | Temp 97.9°F | Wt 213.8 lb

## 2023-07-22 DIAGNOSIS — R7303 Prediabetes: Secondary | ICD-10-CM

## 2023-07-22 DIAGNOSIS — F32A Depression, unspecified: Secondary | ICD-10-CM

## 2023-07-22 DIAGNOSIS — Z1283 Encounter for screening for malignant neoplasm of skin: Secondary | ICD-10-CM

## 2023-07-22 NOTE — Patient Instructions (Addendum)
1. Prediabetes (Primary)  - CBC - Comprehensive metabolic panel  2. Depression, unspecified depression type  - CBC - Comprehensive metabolic panel - AMB Referral VBCI Care Management  3. Skin cancer screening  - Ambulatory referral to Dermatology  Follow up:  Follow up in 6 months

## 2023-07-22 NOTE — Progress Notes (Signed)
Subjective   Patient ID: Alex Charles, male    DOB: 11-13-1965, 58 y.o.   MRN: [id number]  Chief Complaint  Patient presents with   Medical Management of Chronic Issues    Follow up    Referring provider: Ivonne Andrew, NP  Alex Charles is a 58 y.o. male with Past Medical History: No date: Alcoholism in recovery [facility])     Comment:  happened 4 years ago/ went thru detox in the hospital No date: Allergy     Comment:  seasonal No date: Anxiety No date: Blockage of coronary artery of heart (HCC)     Comment:  1 stent put in/ in 2015 No date: CHF (congestive heart failure) (HCC) No date: Depression No date: Hyperlipidemia No date: Prediabetes   HPI  Prediabetes:   Hemoglobin A1c stable today at 6.0.  Patient is trying to watch diet.  Will order labs today:   - POCT glycosylated hemoglobin (Hb A1C) - CBC - Comprehensive metabolic panel       Anxiety and depression   Patient has been doing well with BuSpar and hydroxyzine.  He was following with Alex Charles for counseling here in the office.  He is also getting counseling through work. States that he is ding much better.    PAD:  Patient has been following with cardiology for PAD.  He has been started on Pletal. No longer smoking.      Denies f/c/s, n/v/d, hemoptysis, PND, leg swelling Denies chest pain or edema    No Known Allergies  Immunization History  Administered Date(s) Administered   Influenza,inj,Quad PF,6+ Mos 04/02/2015, 02/17/2018, 02/13/2019   Influenza-Unspecified 04/15/2023   Moderna Sars-Covid-2 Vaccination 10/17/2019, 11/15/2019, 07/02/2020   Tdap 04/08/2017   Zoster Recombinant(Shingrix) 01/20/2019, 07/09/2022    Tobacco History: Social History   Tobacco Use  Smoking Status Former   Current packs/day: 0.00   Average packs/day: 0.2 packs/day for 0.1 years   Types: Cigarettes   Start date: 03/25/2023   Quit date: 05/04/2023   Years since quitting: 0.2  Smokeless Tobacco Never   Tobacco Comments   Patient is participating in health coaching for smoking cessation.    Counseling given: Not Answered Tobacco comments: Patient is participating in health coaching for smoking cessation.    Outpatient Encounter Medications as of 07/22/2023  Medication Sig   albuterol (VENTOLIN HFA) 108 (90 Base) MCG/ACT inhaler TAKE 2 PUFFS BY MOUTH EVERY 6 HOURS AS NEEDED FOR WHEEZE OR SHORTNESS OF BREATH   aspirin EC 81 MG tablet Take 1 tablet (81 mg total) by mouth daily.   busPIRone (BUSPAR) 30 MG tablet TAKE 1 TABLET BY MOUTH 2 TIMES DAILY.   carboxymethylcellulose (REFRESH PLUS) 0.5 % SOLN 1 drop 3 (three) times daily as needed (dry/irritated eyes.).   cetirizine (ZYRTEC) 10 MG tablet TAKE 1 TABLET BY MOUTH EVERY DAY   cilostazol (PLETAL) 100 MG tablet Take 1 tablet (100 mg total) by mouth 2 (two) times daily.   clopidogrel (PLAVIX) 75 MG tablet Take 1 tablet (75 mg total) by mouth daily with breakfast.   Cyanocobalamin (VITAMIN B-12 PO) Take 1 tablet by mouth daily as needed (energy).   diclofenac (VOLTAREN) 75 MG EC tablet TAKE 1 TABLET BY MOUTH 2 TIMES DAILY AS NEEDED.   diclofenac Sodium (VOLTAREN) 1 % GEL Apply 4 g topically 4 (four) times daily. Apply to affected areas 4 times daily as needed for pain.   ezetimibe (ZETIA) 10 MG tablet TAKE 1 TABLET BY MOUTH EVERY DAY  fenofibrate 160 MG tablet TAKE 1 TABLET BY MOUTH EVERY DAY   hydrOXYzine (ATARAX) 10 MG tablet Take 1 tablet (10 mg total) by mouth 3 (three) times daily as needed.   metoprolol succinate (TOPROL-XL) 25 MG 24 hr tablet TAKE 1 TABLET (25 MG TOTAL) BY MOUTH DAILY.   rosuvastatin (CRESTOR) 40 MG tablet TAKE 1 TABLET BY MOUTH EVERY DAY   varenicline (CHANTIX CONTINUING MONTH PAK) 1 MG tablet Take 1 tablet (1 mg total) by mouth 2 (two) times daily.   varenicline (CHANTIX) 1 MG tablet Take 1 tablet (1 mg total) by mouth 2 (two) times daily.   No facility-administered encounter medications on file as of 07/22/2023.     Review of Systems  Review of Systems  Constitutional: Negative.   HENT: Negative.    Cardiovascular: Negative.   Gastrointestinal: Negative.   Allergic/Immunologic: Negative.   Neurological: Negative.   Psychiatric/Behavioral: Negative.       Objective:   BP 114/81   Pulse 66   Temp 97.9 F (36.6 C)   Wt 213 lb 12.8 oz (97 kg)   SpO2 96%   BMI 32.51 kg/m   Wt Readings from Last 5 Encounters:  07/22/23 213 lb 12.8 oz (97 kg)  05/06/23 213 lb (96.6 kg)  04/16/23 212 lb (96.2 kg)  03/11/23 205 lb 3.2 oz (93.1 kg)  03/02/23 206 lb (93.4 kg)     Physical Exam Vitals and nursing note reviewed.  Constitutional:      General: He is not in acute distress.    Appearance: He is well-developed.  Cardiovascular:     Rate and Rhythm: Normal rate and regular rhythm.  Pulmonary:     Effort: Pulmonary effort is normal.     Breath sounds: Normal breath sounds.  Skin:    General: Skin is warm and dry.  Neurological:     Mental Status: He is alert and oriented to person, place, and time.       Assessment & Plan:   Prediabetes -     CBC -     Comprehensive metabolic panel -     Hemoglobin A1c  Depression, unspecified depression type -     CBC -     Comprehensive metabolic panel -     AMB Referral VBCI Care Management  Skin cancer screening -     Ambulatory referral to Dermatology     Return in about 6 months (around 01/19/2024).     Ivonne Andrew, NP 07/22/2023

## 2023-07-23 ENCOUNTER — Telehealth: Payer: Self-pay | Admitting: *Deleted

## 2023-07-23 LAB — CBC
Hematocrit: 46.5 % (ref 37.5–51.0)
Hemoglobin: 15.5 g/dL (ref 13.0–17.7)
MCH: 31.6 pg (ref 26.6–33.0)
MCHC: 33.3 g/dL (ref 31.5–35.7)
MCV: 95 fL (ref 79–97)
Platelets: 233 10*3/uL (ref 150–450)
RBC: 4.9 x10E6/uL (ref 4.14–5.80)
RDW: 12.6 % (ref 11.6–15.4)
WBC: 6.8 10*3/uL (ref 3.4–10.8)

## 2023-07-23 LAB — COMPREHENSIVE METABOLIC PANEL
ALT: 17 [IU]/L (ref 0–44)
AST: 19 [IU]/L (ref 0–40)
Albumin: 4.8 g/dL (ref 3.8–4.9)
Alkaline Phosphatase: 54 [IU]/L (ref 44–121)
BUN/Creatinine Ratio: 15 (ref 9–20)
BUN: 18 mg/dL (ref 6–24)
Bilirubin Total: 0.4 mg/dL (ref 0.0–1.2)
CO2: 23 mmol/L (ref 20–29)
Calcium: 9.9 mg/dL (ref 8.7–10.2)
Chloride: 103 mmol/L (ref 96–106)
Creatinine, Ser: 1.2 mg/dL (ref 0.76–1.27)
Globulin, Total: 2.2 g/dL (ref 1.5–4.5)
Glucose: 90 mg/dL (ref 70–99)
Potassium: 4.8 mmol/L (ref 3.5–5.2)
Sodium: 140 mmol/L (ref 134–144)
Total Protein: 7 g/dL (ref 6.0–8.5)
eGFR: 70 mL/min/{1.73_m2} (ref >59)

## 2023-07-23 LAB — HEMOGLOBIN A1C
Est. average glucose Bld gHb Est-mCnc: 126 mg/dL
Hgb A1c MFr Bld: 6 % — ABNORMAL HIGH (ref 4.8–5.6)

## 2023-07-23 NOTE — Progress Notes (Signed)
Complex Care Management Note  Care Guide Note 07/23/2023 Name: Klinton Candelas MRN: [id number] DOB: 1966-06-01  Derrian Poli is a 58 y.o. year old male who sees Ivonne Andrew, NP for primary care. I reached out to [name] by phone today to offer complex care management services.  Mr. Christiansen was given information about Complex Care Management services today including:   The Complex Care Management services include support from the care team which includes your Nurse Coordinator, Clinical Social Worker, or Pharmacist.  The Complex Care Management team is here to help remove barriers to the health concerns and goals most important to you. Complex Care Management services are voluntary, and the patient may decline or stop services at any time by request to their care team member.   Complex Care Management Consent Status: Patient agreed to services and verbal consent obtained.   Follow up plan:  Telephone appointment with complex care management team member scheduled for:  07/29/2023  Encounter Outcome:  Patient Scheduled  Burman Nieves, CMA, Care Guide [facility]  [facility], [facility] Guide Direct Dial: [phone]  Fax: [phone] Website: [email or web address]

## 2023-07-29 ENCOUNTER — Ambulatory Visit: Payer: Self-pay | Admitting: Licensed Clinical Social Worker

## 2023-07-29 NOTE — Patient Instructions (Signed)
 Visit Information  Thank you for taking time to visit with me today. Please don't hesitate to contact me if I can be of assistance to you.   Following are the goals we discussed today:   Goals Addressed             This Visit's Progress    Care Coordination       Activities and task to complete in order to accomplish goals.   You have decided not to move forward with counseling and would like to work with me on brief coping skills to assist you with managing anxiety Start / continue relaxed breathing 3 times daily Keep all upcoming appointment discussed today Self Support options  (positive self affirmations)         Our next appointment is via phone on 08/12/2023 at 2pm  Please call the care guide team at [phone] if you need to cancel or reschedule your appointment.   If you are experiencing a Mental Health or Behavioral Health Crisis or need someone to talk to, please call 911   Patient verbalizes understanding of instructions and care plan provided today and agrees to view in MyChart. Active MyChart status and patient understanding of how to access instructions and care plan via MyChart confirmed with patient.     Telephone follow up appointment with care management team member scheduled for:08/12/23  Alfonso Rummer MSW, LCSW Licensed Clinical Social Worker  [facility], Population Health Direct Dial: [phone]  Fax: [phone]

## 2023-07-29 NOTE — Patient Outreach (Signed)
  Care Coordination   Initial Visit Note   07/29/2023 Name: Alex Charles MRN: [id number] DOB: September 07, 1965  Alex Charles is a 58 y.o. year old male who sees Alex Bascom RAMAN, NP for primary care. I spoke with  Alex Charles by phone today.  What matters to the patients health and wellness today?  Alex Charles seeks assistance managing anxiety    Goals Addressed             This Visit's Progress    Care Coordination       Activities and task to complete in order to accomplish goals.   You have decided not to move forward with counseling and would like to work with me on brief coping skills to assist you with managing anxiety Start / continue relaxed breathing 3 times daily Keep all upcoming appointment discussed today Self Support options  (positive self affirmations)         SDOH assessments and interventions completed:  Yes  SDOH Interventions Today    Flowsheet Row Most Recent Value  SDOH Interventions   Food Insecurity Interventions Intervention Not Indicated  Housing Interventions Intervention Not Indicated  Transportation Interventions Intervention Not Indicated  Utilities Interventions Intervention Not Indicated        Care Coordination Interventions:  Yes, provided  Interventions Today    Flowsheet Row Most Recent Value  Mental Health Interventions   Mental Health Discussed/Reviewed Depression, Anxiety, Mental Health Discussed, Coping Strategies       Follow up plan: Follow up call scheduled for 08/12/23    Encounter Outcome:  Patient Visit Completed   Alex Charles MSW, LCSW Licensed Clinical Social Worker  [facility], Population Health Direct Dial: [phone]  Fax: [phone]

## 2023-08-09 ENCOUNTER — Other Ambulatory Visit: Payer: Self-pay | Admitting: Nurse Practitioner

## 2023-08-10 ENCOUNTER — Other Ambulatory Visit: Payer: Self-pay | Admitting: Cardiology

## 2023-08-10 ENCOUNTER — Other Ambulatory Visit: Payer: Self-pay | Admitting: Nurse Practitioner

## 2023-08-10 DIAGNOSIS — F419 Anxiety disorder, unspecified: Secondary | ICD-10-CM

## 2023-08-10 NOTE — Telephone Encounter (Signed)
 Please advise [facility]

## 2023-08-12 ENCOUNTER — Ambulatory Visit: Payer: Self-pay | Admitting: Licensed Clinical Social Worker

## 2023-08-12 ENCOUNTER — Ambulatory Visit (HOSPITAL_BASED_OUTPATIENT_CLINIC_OR_DEPARTMENT_OTHER): Payer: BC Managed Care – PPO

## 2023-08-12 ENCOUNTER — Encounter (HOSPITAL_BASED_OUTPATIENT_CLINIC_OR_DEPARTMENT_OTHER): Payer: Self-pay

## 2023-08-12 DIAGNOSIS — Z0000 Encounter for general adult medical examination without abnormal findings: Secondary | ICD-10-CM

## 2023-08-12 NOTE — Progress Notes (Signed)
Appointment Outcome: Completed, Session #: Final                        Start time: 10:04am   End time: 10:34am   Total Mins: 30 minutes  AGREEMENTS SECTION   Overall Goal(s): Smoking cessation: Quit Date (05/04/2023)     Stress management                                            Agreement/Action Steps:  Smoking Cessation Maintain not smoking a cigarette or using an e-cigarette Practice deep breathing Eat real fruit popsicles Drink iced water/Gatorade when cravings occur Remove self from around others when they smoke Utilize support system  Stress management Practice the 5-4-3-2-1 method when feeling anxious Pre-plan daily schedule/routine Practice deep breathing for 2 mins Meditate for 5 mins Write in journal (e.g., while drinking morning coffee, before bed)    Progress Notes:  Patient reported that he has been able to maintain smoking cessation since 05/04/23. Patient stated that he has had an increase in his level of confidence since he has been able to accomplish this goal and to shift with dealing with anxiety. Patient stated that he has continued to incorporate all of the techniques at various times to help manage stress.   Patient shared that one important strategy he engages is managing stress in the moment, or determining the importance of a situation and prioritizing when to address it. Patient shared that he acknowledges his emotions, determine what he needs in the moment, grounds himself in reality, and affirm to himself that he is okay.  Patient stated that the stress level is not the same as it were before, but when it creeps up, he is intentional with prioritizing self-care. Patient shared that he has been going out more to events and going to the [employer]. Patient shared that not isolating himself helps with continuing to build his confidence and remind him of the wins he has with achieving his goals.   Indicators of Success and Accountability: Patient has  been able to maintain smoking cessation since 05/04/23 and   Readiness: Patient is in the maintenance stage of smoking cessation and stress management.   Strengths and Supports: Patient is relying on his confidence. Patient is being supported by friends and family.   Challenges and Barriers: N/A   Coaching Outcomes: Patient has successfully completed the health coaching program.   Attempted: Fulfilled - Patient completed the agreement in full and was able to meet the challenge.

## 2023-08-12 NOTE — Patient Instructions (Signed)
Visit Information  Thank you for taking time to visit with me today. Please don't hesitate to contact me if I can be of assistance to you.   Following are the goals we discussed today:   Goals Addressed             This Visit's Progress    Care Coordination       Activities and task to complete in order to accomplish goals.   You have decided not to move forward with counseling and would like to work with me on brief coping skills to assist you with managing anxiety Start / continue relaxed breathing 3 times daily Keep all upcoming appointment discussed today Self Support options  (positive self affirmations)  Reviewed goals with pt 08/12/2023        Our next appointment is by telephone on 08/26/2023 at 2pm  Please call the care guide team at [phone] if you need to cancel or reschedule your appointment.   If you are experiencing a Mental Health or Behavioral Health Crisis or need someone to talk to, please call 911   Patient verbalizes understanding of instructions and care plan provided today and agrees to view in MyChart. Active MyChart status and patient understanding of how to access instructions and care plan via MyChart confirmed with patient.     Telephone follow up appointment with care management team member scheduled for:08/26/2023  Gwyndolyn Saxon MSW, LCSW Licensed Clinical Social Worker  [facility], Population Health Direct Dial: [phone]  Fax: [phone]

## 2023-08-12 NOTE — Patient Outreach (Signed)
  Care Coordination   Follow Up Visit Note   08/12/2023 Name: Alex Charles MRN: [id number] DOB: 1965-12-18  Alex Charles is a 58 y.o. year old male who sees Alex Andrew, NP for primary care. I spoke with  Alex Charles by phone today.  What matters to the patients health and wellness today?  Alex Charles reports improvement in mood and professional relationships with implemented coping strategies.     Goals Addressed             This Visit's Progress    Care Coordination       Activities and task to complete in order to accomplish goals.   You have decided not to move forward with counseling and would like to work with me on brief coping skills to assist you with managing anxiety Start / continue relaxed breathing 3 times daily Keep all upcoming appointment discussed today Self Support options  (positive self affirmations)  Reviewed goals with pt 08/12/2023        SDOH assessments and interventions completed:  No   SDOH completed 07/29/2023  Care Coordination Interventions:  Yes, provided  Interventions Today    Flowsheet Row Most Recent Value  Mental Health Interventions   Mental Health Discussed/Reviewed Coping Strategies, Mental Health Reviewed  [Pt reports improvement in mood since implementing coping stratetgies]       Follow up plan: Follow up call scheduled for 08/26/2023    Encounter Outcome:  Patient Visit Completed  Gwyndolyn Saxon MSW, LCSW Licensed Clinical Social Worker  [facility], Population Health Direct Dial: [phone]  Fax: [phone]

## 2023-08-14 ENCOUNTER — Other Ambulatory Visit: Payer: Self-pay | Admitting: Nurse Practitioner

## 2023-08-14 DIAGNOSIS — G4700 Insomnia, unspecified: Secondary | ICD-10-CM

## 2023-08-14 NOTE — Telephone Encounter (Signed)
 Please advise [facility]

## 2023-08-19 ENCOUNTER — Encounter: Payer: Self-pay | Admitting: Dermatology

## 2023-08-19 ENCOUNTER — Ambulatory Visit: Payer: BC Managed Care – PPO | Admitting: Dermatology

## 2023-08-19 ENCOUNTER — Ambulatory Visit (INDEPENDENT_AMBULATORY_CARE_PROVIDER_SITE_OTHER): Payer: BC Managed Care – PPO | Admitting: Dermatology

## 2023-08-19 VITALS — BP 102/71

## 2023-08-19 DIAGNOSIS — L814 Other melanin hyperpigmentation: Secondary | ICD-10-CM

## 2023-08-19 DIAGNOSIS — D1801 Hemangioma of skin and subcutaneous tissue: Secondary | ICD-10-CM | POA: Diagnosis not present

## 2023-08-19 DIAGNOSIS — D225 Melanocytic nevi of trunk: Secondary | ICD-10-CM | POA: Diagnosis not present

## 2023-08-19 DIAGNOSIS — D485 Neoplasm of uncertain behavior of skin: Secondary | ICD-10-CM

## 2023-08-19 DIAGNOSIS — D229 Melanocytic nevi, unspecified: Secondary | ICD-10-CM

## 2023-08-19 DIAGNOSIS — Z1283 Encounter for screening for malignant neoplasm of skin: Secondary | ICD-10-CM | POA: Diagnosis not present

## 2023-08-19 DIAGNOSIS — D492 Neoplasm of unspecified behavior of bone, soft tissue, and skin: Secondary | ICD-10-CM | POA: Diagnosis not present

## 2023-08-19 DIAGNOSIS — L821 Other seborrheic keratosis: Secondary | ICD-10-CM

## 2023-08-19 DIAGNOSIS — W908XXA Exposure to other nonionizing radiation, initial encounter: Secondary | ICD-10-CM

## 2023-08-19 DIAGNOSIS — L578 Other skin changes due to chronic exposure to nonionizing radiation: Secondary | ICD-10-CM

## 2023-08-19 NOTE — Patient Instructions (Addendum)

## 2023-08-19 NOTE — Progress Notes (Signed)
   New Patient Visit   Subjective  Alex Charles is a 58 y.o. male who presents for the following: Skin Cancer Screening and Full Body Skin Exam- No history of skin cancer. No family history of Melanoma.  The patient presents for Total-Body Skin Exam (TBSE) for skin cancer screening and mole check. The patient has spots, moles and lesions to be evaluated, some may be new or changing.  The following portions of the chart were reviewed this encounter and updated as appropriate: medications, allergies, medical history  Review of Systems:  No other skin or systemic complaints except as noted in HPI or Assessment and Plan.  Objective  Well appearing patient in no apparent distress; mood and affect are within normal limits.  A full examination was performed including scalp, head, eyes, ears, nose, lips, neck, chest, axillae, abdomen, back, buttocks, bilateral upper extremities, bilateral lower extremities, hands, feet, fingers, toes, fingernails, and toenails. All findings within normal limits unless otherwise noted below.   Relevant physical exam findings are noted in the Assessment and Plan.  Left Upper Back 0.5 cm irregularly shaped pink brown papule   Assessment & Plan   SKIN CANCER SCREENING PERFORMED TODAY.  ACTINIC DAMAGE - Chronic condition, secondary to cumulative UV/sun exposure - diffuse scaly erythematous macules with underlying dyspigmentation - Recommend daily broad spectrum sunscreen SPF 30+ to sun-exposed areas, reapply every 2 hours as needed.  - Staying in the shade or wearing long sleeves, sun glasses (UVA+UVB protection) and wide brim hats (4-inch brim around the entire circumference of the hat) are also recommended for sun protection.  - Call for new or changing lesions.  MELANOCYTIC NEVI - Tan-brown and/or pink-flesh-colored symmetric macules and papules - Benign appearing on exam today - Observation - Call clinic for new or changing moles - Recommend daily use  of broad spectrum spf 30+ sunscreen to sun-exposed areas.   LENTIGINES Exam: scattered tan macules Due to sun exposure Treatment Plan: Benign-appearing, observe. Recommend daily broad spectrum sunscreen SPF 30+ to sun-exposed areas, reapply every 2 hours as needed.  Call for any changes   HEMANGIOMA Exam: red papule(s) Discussed benign nature. Recommend observation. Call for changes.   SEBORRHEIC KERATOSIS - Stuck-on, waxy, tan-brown papules and/or plaques  - Benign-appearing - Discussed benign etiology and prognosis. - Observe - Call for any changes  NEOPLASM OF UNCERTAIN BEHAVIOR OF SKIN Left Upper Back Skin / nail biopsy Type of biopsy: tangential   Informed consent: discussed and consent obtained   Timeout: patient name, date of birth, surgical site, and procedure verified   Procedure prep:  Patient was prepped and draped in usual sterile fashion Prep type:  Isopropyl alcohol Anesthesia: the lesion was anesthetized in a standard fashion   Anesthetic:  1% lidocaine w/ epinephrine 1-100,000 buffered w/ 8.4% NaHCO3 Instrument used: flexible razor blade   Hemostasis achieved with: pressure, aluminum chloride and electrodesiccation   Outcome: patient tolerated procedure well   Post-procedure details: sterile dressing applied and wound care instructions given   Dressing type: bandage and petrolatum   Specimen 1 - Surgical pathology Differential Diagnosis: R/O DN vs MM vs other   Check Margins: No  Return if symptoms worsen or fail to improve.  I, Joanie Coddington, CMA, am acting as scribe for Gwenith Daily, MD .   Documentation: I have reviewed the above documentation for accuracy and completeness, and I agree with the above.  Gwenith Daily, MD

## 2023-08-20 LAB — SURGICAL PATHOLOGY

## 2023-08-23 ENCOUNTER — Other Ambulatory Visit: Payer: Self-pay | Admitting: Nurse Practitioner

## 2023-08-26 ENCOUNTER — Telehealth: Payer: Self-pay | Admitting: Dermatology

## 2023-08-26 ENCOUNTER — Ambulatory Visit: Payer: Self-pay | Admitting: Licensed Clinical Social Worker

## 2023-08-26 NOTE — Telephone Encounter (Signed)
-----   Message from [facility] PACI sent at 08/26/2023  2:12 PM EST ----- Please call patient to discuss results showing DN moderate to severe on left upper back and get him scheduled for excision with me.

## 2023-08-26 NOTE — Telephone Encounter (Signed)
 Called with no answer and not able to lvm

## 2023-08-27 NOTE — Patient Outreach (Signed)
 Care Coordination   08/27/2023 Name: Alex Charles MRN: [id number] DOB: Jun 14, 1966   Care Coordination Outreach Attempts:  An unsuccessful telephone outreach was attempted today to offer the patient information about available complex care management services.  Follow Up Plan:  Additional outreach attempts will be made to offer the patient complex care management information and services.   Encounter Outcome:  No Answer   Care Coordination Interventions:  No, not indicated     Gwyndolyn Saxon MSW, LCSW Licensed Clinical Social Worker  [facility], Population Health Direct Dial: [phone]  Fax: [phone]

## 2023-09-01 ENCOUNTER — Other Ambulatory Visit: Payer: Self-pay | Admitting: Nurse Practitioner

## 2023-09-01 ENCOUNTER — Telehealth: Payer: Self-pay | Admitting: Dermatology

## 2023-09-01 NOTE — Telephone Encounter (Signed)
-----   Message from [facility] PACI sent at 08/26/2023  2:12 PM EST ----- Please call patient to discuss results showing DN moderate to severe on left upper back and get him scheduled for excision with me.

## 2023-09-01 NOTE — Telephone Encounter (Signed)
 I spoke to pt to go over pathology results and what to expect the day of the appointment. Pt stated understanding and ok to send to scheduling.

## 2023-09-01 NOTE — Telephone Encounter (Signed)
 Please advise [facility]

## 2023-09-01 NOTE — Telephone Encounter (Signed)
 Unable to leave vm.

## 2023-09-10 ENCOUNTER — Telehealth: Payer: Self-pay | Admitting: *Deleted

## 2023-09-10 NOTE — Progress Notes (Signed)
 Complex Care Management Care Guide Note  09/10/2023 Name: Alex Charles MRN: [id number] DOB: [birth year]  [name] is a 58 y.o. year old male who is a primary care patient of Ivonne Andrew, NP and is actively engaged with the care management team. I reached out to [name] by phone today to assist with re-scheduling  with the Licensed Clinical Child psychotherapist.  Follow up plan: Unsuccessful telephone outreach attempt made. A HIPAA compliant phone message was left for the patient providing contact information and requesting a return call.  Burman Nieves, CMA, Care Guide [facility]  [facility], [facility] Guide Direct Dial: [phone]  Fax: [phone] Website: [email or web address]

## 2023-09-18 NOTE — Progress Notes (Signed)
 Complex Care Management Care Guide Note  09/18/2023 Name: Alex Charles MRN: [id number] DOB: [birth year]  Alex Charles is a 58 y.o. year old male who is a primary care patient of Ivonne Andrew, NP and is actively engaged with the care management team. I reached out to [name] by phone today to assist with re-scheduling  with the Licensed Clinical Child psychotherapist.  Follow up plan: Unsuccessful telephone outreach attempt made. A HIPAA compliant phone message was left for the patient providing contact information and requesting a return call. No further outreaches will be made due to inability to maintain patient contact  Burman Nieves, CMA [facility]  [facility], [facility] Guide Direct Dial: [phone]  Fax: [phone] Website: [email or web address]

## 2023-09-23 ENCOUNTER — Encounter: Payer: Self-pay | Admitting: Dermatology

## 2023-09-30 ENCOUNTER — Encounter: Payer: Self-pay | Admitting: Dermatology

## 2023-09-30 ENCOUNTER — Ambulatory Visit: Admitting: Dermatology

## 2023-09-30 VITALS — BP 115/73 | HR 60

## 2023-09-30 DIAGNOSIS — D239 Other benign neoplasm of skin, unspecified: Secondary | ICD-10-CM

## 2023-09-30 DIAGNOSIS — D235 Other benign neoplasm of skin of trunk: Secondary | ICD-10-CM

## 2023-09-30 NOTE — Patient Instructions (Signed)

## 2023-09-30 NOTE — Progress Notes (Signed)
 Follow-Up Visit   Subjective  Alex Charles is a 58 y.o. male who presents for the following: Excision of a DN moderate to severe on the left upper back, biopsied by Dr. Caralyn Guile.  The following portions of the chart were reviewed this encounter and updated as appropriate: medications, allergies, medical history  Review of Systems:  No other skin or systemic complaints except as noted in HPI or Assessment and Plan.  Objective  Well appearing patient in no apparent distress; mood and affect are within normal limits.  A focused examination was performed of the following areas: Left upper back Relevant physical exam findings are noted in the Assessment and Plan.     Assessment & Plan   DYSPLASTIC NEVUS Left Upper Back Skin excision - Left Upper Back  Excision method:  elliptical Lesion length (cm):  1.1 Lesion width (cm):  0.6 Margin per side (cm):  0.5 Total excision diameter (cm):  2.1 Informed consent: discussed and consent obtained   Timeout: patient name, date of birth, surgical site, and procedure verified   Procedure prep:  Patient was prepped and draped in usual sterile fashion Prep type:  Chlorhexidine Anesthesia: the lesion was anesthetized in a standard fashion   Anesthetic:  1% lidocaine w/ epinephrine 1-100,000 buffered w/ 8.4% NaHCO3 Instrument used: #15 blade   Hemostasis achieved with: suture, pressure and electrodesiccation   Outcome: patient tolerated procedure well with no complications   Post-procedure details: sterile dressing applied and wound care instructions given   Dressing type: bandage and pressure dressing    Skin repair - Left Upper Back Complexity:  Complex Final length (cm):  4.8 Informed consent: discussed and consent obtained   Timeout: patient name, date of birth, surgical site, and procedure verified   Procedure prep:  Patient was prepped and draped in usual sterile fashion Prep type:  Chlorhexidine Anesthesia: the lesion was  anesthetized in a standard fashion   Anesthetic:  1% lidocaine w/ epinephrine 1-100,000 buffered w/ 8.4% NaHCO3 Reason for type of repair: allow closure of the large defect, avoid adjacent structures, allow side-to-side closure without requiring a flap or graft and compensate for the inelasticity of skin in this area   Undermining: area extensively undermined   Subcutaneous layers (deep stitches):  Suture size:  3-0 Suture type: PDS (polydioxanone)   Stitches:  Buried vertical mattress Fine/surface layer approximation (top stitches):  Suture type: cyanoacrylate tissue glue   Suture removal (days):  0 Hemostasis achieved with: suture, pressure and electrodesiccation Outcome: patient tolerated procedure well with no complications   Post-procedure details: sterile dressing applied and wound care instructions given   Dressing type: bandage and pressure dressing   Specimen 1 - Surgical pathology Differential Diagnosis: DN [id number] Check Margins: No  The surgical wound was then cleaned, prepped, and re-anesthetized as above. Wound edges were undermined extensively along at least one entire edge and at a distance equal to or greater than the width of the defect (see wound defect size above) in order to achieve closure and decrease wound tension and anatomic distortion. Redundant tissue repair including standing cone removal was performed. Hemostasis was achieved with electrocautery. Subcutaneous and epidermal tissues were approximated with the above sutures. The surgical site was then lightly scrubbed with sterile, saline-soaked gauze. Steri-strips were applied, and the area was then bandaged using Vaseline ointment, non-adherent gauze, gauze pads, and tape to provide an adequate pressure dressing. The patient tolerated the procedure well, was given detailed written and verbal wound care instructions, and was discharged  in good condition.   The patient will follow-up: PRN.  Return if symptoms  worsen or fail to improve.  I, Manual Meier, Surg Tech III, am acting as scribe for Gwenith Daily, MD.   Documentation: I have reviewed the above documentation for accuracy and completeness, and I agree with the above.  Gwenith Daily, MD

## 2023-10-02 LAB — SURGICAL PATHOLOGY

## 2023-10-14 ENCOUNTER — Ambulatory Visit
Admission: RE | Admit: 2023-10-14 | Discharge: 2023-10-14 | Disposition: A | Discharge status: Home / Self Care | Source: Ambulatory Visit | Attending: Family Medicine | Admitting: Family Medicine

## 2023-10-14 ENCOUNTER — Other Ambulatory Visit: Payer: Self-pay

## 2023-10-14 VITALS — BP 112/77 | HR 68 | Temp 97.5°F | Resp 16

## 2023-10-14 DIAGNOSIS — J040 Acute laryngitis: Secondary | ICD-10-CM | POA: Diagnosis not present

## 2023-10-14 DIAGNOSIS — J039 Acute tonsillitis, unspecified: Secondary | ICD-10-CM

## 2023-10-14 LAB — POCT RAPID STREP A (OFFICE): Rapid Strep A Screen: NEGATIVE

## 2023-10-14 LAB — POCT MONO SCREEN (KUC): Mono, POC: NEGATIVE

## 2023-10-14 MED ORDER — PREDNISONE 20 MG PO TABS: 40.0000 mg | ORAL_TABLET | Freq: Every day | ORAL | 0 refills | Status: AC

## 2023-10-14 MED ORDER — AMOXICILLIN 500 MG PO CAPS: 500.0000 mg | ORAL_CAPSULE | Freq: Two times a day (BID) | ORAL | 0 refills | Status: AC

## 2023-10-14 NOTE — ED Triage Notes (Signed)
 Pt c/o sore throatx1wk. Pt has dysphagia started today. Pt states he's not having pain, but voice is strained.

## 2023-10-14 NOTE — Discharge Instructions (Addendum)
 Start amoxicillin  twice daily for 10 days.  Prednisone  daily for 5 days.  Continue your allergy medicine.  Salt water gargles and warm liquids such as consistently.  Follow-up with your PCP if your symptoms do not improve.  Please go to the ER for any worsening symptoms.  Hope you feel better soon!

## 2023-10-14 NOTE — ED Provider Notes (Signed)
 [facility]    CSN: [id number] Arrival date & time: 10/14/23  1635      History   Chief Complaint Chief Complaint  Patient presents with   Sore Throat    Entered by patient    HPI Alex Charles is a 58 y.o. male  presents for evaluation of URI symptoms for 7 days. Patient reports associated symptoms of sore throat/laryngitis/globus sensation.  Endorses some slight congestion but not really a cough.  Denies N/V/D, fevers ear pain, body aches, shortness of breath. Reports no sick contacts.  Pt has taken allergy medicine OTC for symptoms. Pt has no other concerns at this time.    Sore Throat    Past Medical History:  Diagnosis Date   Alcoholism in recovery [facility])    happened 4 years ago/ went thru detox in the hospital   Allergy    seasonal   Anxiety    Blockage of coronary artery of heart (HCC)    1 stent put in/ in 2015   CHF (congestive heart failure) (HCC)    Depression    Hyperlipidemia    Prediabetes     Patient Active Problem List   Diagnosis Date Noted   Claudication in peripheral vascular disease (HCC) 04/16/2023   Mediastinal mass 04/09/2022   Anxiety and depression 03/06/2022   Peripheral arterial disease (HCC) 01/07/2020   Neuropathy 04/09/2017   Depression 11/09/2015   Generalized anxiety disorder 11/09/2015   Tobacco dependence 11/09/2015   Chronic pain of right hip 07/04/2015   Acute right hip pain 05/30/2015   Sciatica neuralgia 05/30/2015   Hyperlipidemia 04/02/2015   Fatty liver 04/02/2015   Alcoholism in remission (HCC) 04/02/2015   Prediabetes 04/02/2015   Chronic systolic heart failure (HCC) 04/02/2015   Hip pain, chronic 04/02/2015   Elevated lipase 04/02/2015    Past Surgical History:  Procedure Laterality Date   ABDOMINAL AORTOGRAM W/LOWER EXTREMITY N/A 03/02/2023   Procedure: ABDOMINAL AORTOGRAM W/LOWER EXTREMITY;  Surgeon: Avanell Leigh, MD;  Location: [facility];  Service: Cardiovascular;  Laterality: N/A;    ABDOMINAL AORTOGRAM W/LOWER EXTREMITY N/A 04/16/2023   Procedure: ABDOMINAL AORTOGRAM W/LOWER EXTREMITY;  Surgeon: Avanell Leigh, MD;  Location: [facility];  Service: Cardiovascular;  Laterality: N/A;   CORONARY ANGIOPLASTY WITH STENT PLACEMENT  2015   one stent   OTHER SURGICAL HISTORY     PERIPHERAL VASCULAR ATHERECTOMY  04/16/2023   Procedure: PERIPHERAL VASCULAR ATHERECTOMY;  Surgeon: Avanell Leigh, MD;  Location: [facility];  Service: Cardiovascular;;   tooth extracted         Home Medications    Prior to Admission medications   Medication Sig Start Date End Date Taking? Authorizing Provider  amoxicillin  (AMOXIL ) 500 MG capsule Take 1 capsule (500 mg total) by mouth 2 (two) times daily for 10 days. 10/14/23 10/24/23 Yes Sheray Grist, Jodi R, NP  predniSONE  (DELTASONE ) 20 MG tablet Take 2 tablets (40 mg total) by mouth daily with breakfast for 5 days. 10/14/23 10/19/23 Yes Darren Nodal, Jodi R, NP  albuterol  (VENTOLIN  HFA) 108 (90 Base) MCG/ACT inhaler TAKE 2 PUFFS BY MOUTH EVERY 6 HOURS AS NEEDED FOR WHEEZE OR SHORTNESS OF BREATH 09/01/23   Jerrlyn Morel, NP  aspirin  EC 81 MG tablet Take 1 tablet (81 mg total) by mouth daily. 09/04/16   Sigurd Driver, FNP  busPIRone  (BUSPAR ) 30 MG tablet TAKE 1 TABLET BY MOUTH 2 TIMES DAILY. 05/04/23   Nichols, Tonya S, NP  carboxymethylcellulose (  REFRESH PLUS) 0.5 % SOLN 1 drop 3 (three) times daily as needed (dry/irritated eyes.).    [provider]  cetirizine  (ZYRTEC ) 10 MG tablet TAKE 1 TABLET BY MOUTH EVERY DAY 01/21/23   Nichols, Tonya S, NP  cilostazol  (PLETAL ) 100 MG tablet Take 1 tablet (100 mg total) by mouth 2 (two) times daily. 05/06/23   Avanell Leigh, MD  clopidogrel  (PLAVIX ) 75 MG tablet Take 1 tablet (75 mg total) by mouth daily with breakfast. 04/18/23   Duke, Warren Haber, PA  Cyanocobalamin (VITAMIN B-12 PO) Take 1 tablet by mouth daily as needed (energy).    [provider]  diclofenac  (VOLTAREN )  75 MG EC tablet TAKE 1 TABLET BY MOUTH 2 TIMES DAILY AS NEEDED. 08/11/23   Nichols, Tonya S, NP  diclofenac  Sodium (VOLTAREN ) 1 % GEL Apply 4 g topically 4 (four) times daily. Apply to affected areas 4 times daily as needed for pain. 12/02/21   Eloise Hake Scales, PA-C  ezetimibe  (ZETIA ) 10 MG tablet TAKE 1 TABLET BY MOUTH EVERY DAY 04/24/23   Wendie Hamburg, MD  fenofibrate  160 MG tablet TAKE 1 TABLET BY MOUTH EVERY DAY 08/24/23   Nichols, Tonya S, NP  hydrOXYzine  (ATARAX ) 10 MG tablet TAKE 1 TABLET BY MOUTH THREE TIMES A DAY AS NEEDED 08/11/23   Nichols, Tonya S, NP  metoprolol  succinate (TOPROL -XL) 25 MG 24 hr tablet TAKE 1 TABLET (25 MG TOTAL) BY MOUTH DAILY. 08/11/23   Avanell Leigh, MD  rosuvastatin  (CRESTOR ) 40 MG tablet TAKE 1 TABLET BY MOUTH EVERY DAY 02/11/23   Wendie Hamburg, MD  traZODone  (DESYREL ) 50 MG tablet TAKE 1 TABLET BY MOUTH AT BEDTIME AS NEEDED FOR SLEEP 08/16/23   Jerrlyn Morel, NP  varenicline  (CHANTIX  CONTINUING MONTH PAK) 1 MG tablet Take 1 tablet (1 mg total) by mouth 2 (two) times daily. 05/05/23   Wendie Hamburg, MD  varenicline  (CHANTIX ) 1 MG tablet Take 1 tablet (1 mg total) by mouth 2 (two) times daily. 05/05/23   Wendie Hamburg, MD    Family History Family History  Problem Relation Age of Onset   Bladder Cancer Mother    Crohn's disease Mother    Hypertension Father    Irritable bowel syndrome Sister     Social History Social History   Tobacco Use   Smoking status: Former    Current packs/day: 0.00    Average packs/day: 0.2 packs/day for 0.1 years    Types: Cigarettes    Start date: 03/25/2023    Quit date: 05/04/2023    Years since quitting: 0.4   Smokeless tobacco: Never   Tobacco comments:    Patient completed health coaching for smoking cessation.  Vaping Use   Vaping status: Never Used  Substance Use Topics   Alcohol use: No   Drug use: Never     Allergies   Patient has no known  allergies.   Review of Systems Review of Systems  HENT:  Positive for congestion, postnasal drip, sore throat and voice change.      Physical Exam Triage Vital Signs ED Triage Vitals  Encounter Vitals Group     BP 10/14/23 1647 112/77     Systolic BP Percentile --      Diastolic BP Percentile --      Pulse Rate 10/14/23 1647 68     Resp 10/14/23 1647 16     Temp 10/14/23 1647 (!) 97.5 F (36.4 C)     Temp Source  10/14/23 1647 Oral     SpO2 10/14/23 1647 95 %     Weight --      Height --      Head Circumference --      Peak Flow --      Pain Score 10/14/23 1644 0     Pain Loc --      Pain Education --      Exclude from Growth Chart --    No data found.  Updated Vital Signs BP 112/77   Pulse 68   Temp (!) 97.5 F (36.4 C) (Oral)   Resp 16   SpO2 95%   Visual Acuity Right Eye Distance:   Left Eye Distance:   Bilateral Distance:    Right Eye Near:   Left Eye Near:    Bilateral Near:     Physical Exam Vitals and nursing note reviewed.  Constitutional:      General: He is not in acute distress.    Appearance: Normal appearance. He is not ill-appearing or toxic-appearing.  HENT:     Head: Normocephalic and atraumatic.     Right Ear: Tympanic membrane and ear canal normal.     Left Ear: Tympanic membrane and ear canal normal.     Nose: Congestion present.     Mouth/Throat:     Mouth: Mucous membranes are moist.     Pharynx: Posterior oropharyngeal erythema present.  Eyes:     Pupils: Pupils are equal, round, and reactive to light.  Cardiovascular:     Rate and Rhythm: Normal rate and regular rhythm.  Pulmonary:     Effort: Pulmonary effort is normal.  Musculoskeletal:     Cervical back: Normal range of motion and neck supple.  Lymphadenopathy:     Cervical: No cervical adenopathy.  Skin:    General: Skin is warm and dry.  Neurological:     General: No focal deficit present.     Mental Status: He is alert and oriented to person, place, and time.   Psychiatric:        Mood and Affect: Mood normal.        Behavior: Behavior normal.      UC Treatments / Results  Labs (all labs ordered are listed, but only abnormal results are displayed) Labs Reviewed  POCT RAPID STREP A (OFFICE) - Normal  POCT MONO SCREEN ([facility]) - Normal    EKG   Radiology No results found.  Procedures Procedures (including critical care time)  Medications Ordered in UC Medications - No data to display  Initial Impression / Assessment and Plan / UC Course  I have reviewed the triage vital signs and the nursing notes.  Pertinent labs & imaging results that were available during my care of the patient were reviewed by me and considered in my medical decision making (see chart for details).     Reviewed exam and symptoms with patient.  No red flags.  Negative rapid strep and mono.  Discussed tonsillitis/laryngitis.  Start amoxicillin  and prednisone .  Encouraged warm liquids and salt water gargles.  PCP follow-up if symptoms do not improve.  ER precautions reviewed and patient verbalized understanding. Final Clinical Impressions(s) / UC Diagnoses   Final diagnoses:  Acute tonsillitis, unspecified etiology  Laryngitis     Discharge Instructions      Start amoxicillin  twice daily for 10 days.  Prednisone  daily for 5 days.  Continue your allergy medicine.  Salt water gargles and warm liquids such as consistently.  Follow-up with  your PCP if your symptoms do not improve.  Please go to the ER for any worsening symptoms.  Hope you feel better soon!     ED Prescriptions     Medication Sig Dispense Auth. Provider   amoxicillin  (AMOXIL ) 500 MG capsule Take 1 capsule (500 mg total) by mouth 2 (two) times daily for 10 days. 20 capsule Sharian Delia, Jodi R, NP   predniSONE  (DELTASONE ) 20 MG tablet Take 2 tablets (40 mg total) by mouth daily with breakfast for 5 days. 10 tablet Jenniefer Salak, Jodi R, NP      PDMP not reviewed this encounter.   Alleen Arbour,  NP 10/14/23 [phone]

## 2023-11-04 ENCOUNTER — Ambulatory Visit (HOSPITAL_COMMUNITY)
Admission: RE | Admit: 2023-11-04 | Discharge: 2023-11-04 | Disposition: A | Discharge status: Home / Self Care | Payer: BC Managed Care – PPO | Source: Ambulatory Visit | Attending: Cardiovascular Disease | Admitting: Cardiovascular Disease

## 2023-11-04 ENCOUNTER — Ambulatory Visit: Payer: Self-pay | Admitting: Cardiovascular Disease

## 2023-11-04 ENCOUNTER — Ambulatory Visit (HOSPITAL_BASED_OUTPATIENT_CLINIC_OR_DEPARTMENT_OTHER)
Admission: RE | Admit: 2023-11-04 | Discharge: 2023-11-04 | Disposition: A | Discharge status: Home / Self Care | Source: Ambulatory Visit | Attending: Cardiovascular Disease | Admitting: Cardiovascular Disease

## 2023-11-04 DIAGNOSIS — I739 Peripheral vascular disease, unspecified: Secondary | ICD-10-CM

## 2023-11-05 LAB — VAS US ABI WITH/WO TBI
Left ABI: 0.69
Right ABI: 0.92

## 2023-11-09 ENCOUNTER — Other Ambulatory Visit: Payer: Self-pay | Admitting: *Deleted

## 2023-11-09 DIAGNOSIS — I739 Peripheral vascular disease, unspecified: Secondary | ICD-10-CM

## 2023-12-08 ENCOUNTER — Other Ambulatory Visit: Payer: Self-pay | Admitting: Cardiology

## 2023-12-28 ENCOUNTER — Other Ambulatory Visit: Payer: Self-pay | Admitting: Cardiology

## 2023-12-31 NOTE — Telephone Encounter (Signed)
 Pt of Dr. Court. Does Dr. Court want to refill this RX? Please advise.

## 2024-01-07 DIAGNOSIS — H53021 Refractive amblyopia, right eye: Secondary | ICD-10-CM | POA: Diagnosis not present

## 2024-01-07 DIAGNOSIS — H524 Presbyopia: Secondary | ICD-10-CM | POA: Diagnosis not present

## 2024-01-20 ENCOUNTER — Ambulatory Visit (HOSPITAL_COMMUNITY)
Admission: RE | Admit: 2024-01-20 | Discharge: 2024-01-20 | Disposition: A | Discharge status: Home / Self Care | Source: Ambulatory Visit | Attending: Nurse Practitioner | Admitting: Nurse Practitioner

## 2024-01-20 ENCOUNTER — Telehealth: Payer: Self-pay

## 2024-01-20 ENCOUNTER — Encounter: Payer: Self-pay | Admitting: Nurse Practitioner

## 2024-01-20 ENCOUNTER — Ambulatory Visit (INDEPENDENT_AMBULATORY_CARE_PROVIDER_SITE_OTHER): Payer: Self-pay | Admitting: Nurse Practitioner

## 2024-01-20 VITALS — BP 116/69 | HR 73 | Temp 98.0°F | Wt 212.4 lb

## 2024-01-20 DIAGNOSIS — Z13220 Encounter for screening for lipoid disorders: Secondary | ICD-10-CM

## 2024-01-20 DIAGNOSIS — M25551 Pain in right hip: Secondary | ICD-10-CM | POA: Diagnosis not present

## 2024-01-20 DIAGNOSIS — F3289 Other specified depressive episodes: Secondary | ICD-10-CM

## 2024-01-20 DIAGNOSIS — F419 Anxiety disorder, unspecified: Secondary | ICD-10-CM | POA: Diagnosis not present

## 2024-01-20 DIAGNOSIS — R7303 Prediabetes: Secondary | ICD-10-CM

## 2024-01-20 LAB — POCT GLYCOSYLATED HEMOGLOBIN (HGB A1C): Hemoglobin A1C: 6.1 % — AB (ref 4.0–5.6)

## 2024-01-20 NOTE — Progress Notes (Signed)
 Subjective   Patient ID: Alex Charles, male    DOB: 03-16-1966, 58 y.o.   MRN: [id number]  Chief Complaint  Patient presents with   Medical Management of Chronic Issues    Follow up     Referring provider: Oley Bascom RAMAN, NP  Alex Charles is a 58 y.o. male with Past Medical History: No date: Alcoholism in recovery [facility])     Comment:  happened 4 years ago/ went thru detox in the hospital No date: Allergy     Comment:  seasonal No date: Anxiety No date: Blockage of coronary artery of heart (HCC)     Comment:  1 stent put in/ in 2015 No date: CHF (congestive heart failure) (HCC) No date: Depression No date: Hyperlipidemia No date: Prediabetes   HPI  Prediabetes:   Hemoglobin A1c stable today at 6.1.  Patient is trying to watch diet.  Will order labs today:   - POCT glycosylated hemoglobin (Hb A1C) - CBC - Comprehensive metabolic panel       Anxiety and depression   Patient has been doing well with BuSpar  and hydroxyzine .  He was following counseling.  He is also getting counseling through work. States that he is having some paranoid thoughts.  He is concerned that maybe he is having medication interactions causing this.  I have sent a message to the pharmacist to look over his medications.  We will also get him in with psychiatry for medication management.     PAD:   Patient has been following with cardiology for PAD.  He has been started on Pletal . No longer smoking.      Denies f/c/s, n/v/d, hemoptysis, PND, leg swelling Denies chest pain or edema    No Known Allergies  Immunization History  Administered Date(s) Administered   Influenza,inj,Quad PF,6+ Mos 04/02/2015, 02/17/2018, 02/13/2019   Influenza-Unspecified 04/15/2023   Moderna Sars-Covid-2 Vaccination 10/17/2019, 11/15/2019, 07/02/2020   Tdap 04/08/2017   Zoster Recombinant(Shingrix ) 01/20/2019, 07/09/2022    Tobacco History: Social History   Tobacco Use  Smoking Status Former    Current packs/day: 0.00   Average packs/day: 0.2 packs/day for 0.1 years   Types: Cigarettes   Start date: 03/25/2023   Quit date: 05/04/2023   Years since quitting: 0.7  Smokeless Tobacco Never  Tobacco Comments   Patient completed health coaching for smoking cessation.   Counseling given: Not Answered Tobacco comments: Patient completed health coaching for smoking cessation.   Outpatient Encounter Medications as of 01/20/2024  Medication Sig   albuterol  (VENTOLIN  HFA) 108 (90 Base) MCG/ACT inhaler TAKE 2 PUFFS BY MOUTH EVERY 6 HOURS AS NEEDED FOR WHEEZE OR SHORTNESS OF BREATH   aspirin  EC 81 MG tablet Take 1 tablet (81 mg total) by mouth daily.   busPIRone  (BUSPAR ) 30 MG tablet TAKE 1 TABLET BY MOUTH 2 TIMES DAILY.   carboxymethylcellulose (REFRESH PLUS) 0.5 % SOLN 1 drop 3 (three) times daily as needed (dry/irritated eyes.).   cetirizine  (ZYRTEC ) 10 MG tablet TAKE 1 TABLET BY MOUTH EVERY DAY   cilostazol  (PLETAL ) 100 MG tablet Take 1 tablet (100 mg total) by mouth 2 (two) times daily.   clopidogrel  (PLAVIX ) 75 MG tablet Take 1 tablet (75 mg total) by mouth daily with breakfast.   Cyanocobalamin (VITAMIN B-12 PO) Take 1 tablet by mouth daily as needed (energy).   diclofenac  (VOLTAREN ) 75 MG EC tablet TAKE 1 TABLET BY MOUTH 2 TIMES DAILY AS NEEDED.   diclofenac  Sodium (VOLTAREN ) 1 % GEL Apply 4 g topically  4 (four) times daily. Apply to affected areas 4 times daily as needed for pain.   ezetimibe  (ZETIA ) 10 MG tablet TAKE 1 TABLET BY MOUTH EVERY DAY   fenofibrate  160 MG tablet TAKE 1 TABLET BY MOUTH EVERY DAY   hydrOXYzine  (ATARAX ) 10 MG tablet TAKE 1 TABLET BY MOUTH THREE TIMES A DAY AS NEEDED   metoprolol  succinate (TOPROL -XL) 25 MG 24 hr tablet TAKE 1 TABLET (25 MG TOTAL) BY MOUTH DAILY.   rosuvastatin  (CRESTOR ) 40 MG tablet TAKE 1 TABLET BY MOUTH EVERY DAY   traZODone  (DESYREL ) 50 MG tablet TAKE 1 TABLET BY MOUTH AT BEDTIME AS NEEDED FOR SLEEP   varenicline  (CHANTIX  CONTINUING  MONTH PAK) 1 MG tablet Take 1 tablet (1 mg total) by mouth 2 (two) times daily.   varenicline  (CHANTIX ) 1 MG tablet Take 1 tablet (1 mg total) by mouth 2 (two) times daily.   No facility-administered encounter medications on file as of 01/20/2024.    Review of Systems  Review of Systems  Constitutional: Negative.   HENT: Negative.    Cardiovascular: Negative.   Gastrointestinal: Negative.   Allergic/Immunologic: Negative.   Neurological: Negative.   Psychiatric/Behavioral: Negative.       Objective:   BP 116/69   Pulse 73   Temp 98 F (36.7 C) (Oral)   Wt 212 lb 6.4 oz (96.3 kg)   SpO2 96%   BMI 32.30 kg/m   Wt Readings from Last 5 Encounters:  01/20/24 212 lb 6.4 oz (96.3 kg)  07/22/23 213 lb 12.8 oz (97 kg)  05/06/23 213 lb (96.6 kg)  04/16/23 212 lb (96.2 kg)  03/11/23 205 lb 3.2 oz (93.1 kg)     Physical Exam Vitals and nursing note reviewed.  Constitutional:      General: He is not in acute distress.    Appearance: He is well-developed.  Cardiovascular:     Rate and Rhythm: Normal rate and regular rhythm.  Pulmonary:     Effort: Pulmonary effort is normal.     Breath sounds: Normal breath sounds.  Skin:    General: Skin is warm and dry.  Neurological:     Mental Status: He is alert and oriented to person, place, and time.       Assessment & Plan:   Prediabetes -     POCT glycosylated hemoglobin (Hb A1C) -     CBC -     Comprehensive metabolic panel with GFR  Anxiety -     Ambulatory referral to Psychiatry  Other depression -     Ambulatory referral to Psychiatry  Right hip pain -     DG HIP UNILAT W OR W/O PELVIS 2-3 VIEWS RIGHT -     Ambulatory referral to Physical Therapy  Lipid screening -     Lipid panel     No follow-ups on file.   Bascom GORMAN Borer, NP 01/20/2024

## 2024-01-20 NOTE — Telephone Encounter (Signed)
 Was consulted by patient's PCP to review medication list for possible drug interactions or medications that could be contributing to paranoid thoughts.   Patient is not on any medications with a high risk of paranoia, however there have been case reports of paranoia with varenicline . In the EAGLES trial, varenicline  was not shown to increase the risk of psychosis or paranoia compared to placebo, but rare cases have been reported especially in patients with underlying psychiatric comorbidities. Discussed with PCP that patient has been on varenicline  1 mg BID for almost 1 year and has been able to remain quit from smoking, therefore it would be appropriate to discontinue therapy (or reduce dose to 0.5 mg BID, if he is concerned about cravings) and see if this improves paranoid thoughts.   Will follow-up with patient and provider as needed.   Lorain Baseman, PharmD [facility] [phone]

## 2024-01-21 ENCOUNTER — Ambulatory Visit: Payer: Self-pay | Admitting: Nurse Practitioner

## 2024-01-21 LAB — CBC
Hematocrit: 48.7 % (ref 37.5–51.0)
Hemoglobin: 15.8 g/dL (ref 13.0–17.7)
MCH: 31 pg (ref 26.6–33.0)
MCHC: 32.4 g/dL (ref 31.5–35.7)
MCV: 96 fL (ref 79–97)
Platelets: 205 x10E3/uL (ref 150–450)
RBC: 5.1 x10E6/uL (ref 4.14–5.80)
RDW: 13.5 % (ref 11.6–15.4)
WBC: 7.1 x10E3/uL (ref 3.4–10.8)

## 2024-01-21 LAB — COMPREHENSIVE METABOLIC PANEL WITH GFR
ALT: 33 IU/L (ref 0–44)
AST: 33 IU/L (ref 0–40)
Albumin: 4.7 g/dL (ref 3.8–4.9)
Alkaline Phosphatase: 55 IU/L (ref 44–121)
BUN/Creatinine Ratio: 13 (ref 10–24)
BUN: 17 mg/dL (ref 8–27)
Bilirubin Total: 0.4 mg/dL (ref 0.0–1.2)
CO2: 21 mmol/L (ref 20–29)
Calcium: 9.8 mg/dL (ref 8.6–10.2)
Chloride: 103 mmol/L (ref 96–106)
Creatinine, Ser: 1.27 mg/dL (ref 0.76–1.27)
Globulin, Total: 2.3 g/dL (ref 1.5–4.5)
Glucose: 88 mg/dL (ref 70–99)
Potassium: 4.5 mmol/L (ref 3.5–5.2)
Sodium: 140 mmol/L (ref 134–144)
Total Protein: 7 g/dL (ref 6.0–8.5)
eGFR: 65 mL/min/1.73 (ref >59)

## 2024-01-21 LAB — LIPID PANEL
Chol/HDL Ratio: 3 ratio (ref 0.0–5.0)
Cholesterol, Total: 113 mg/dL (ref 100–199)
HDL: 38 mg/dL — ABNORMAL LOW (ref >39)
LDL Chol Calc (NIH): 57 mg/dL (ref 0–99)
Triglycerides: 93 mg/dL (ref 0–149)
VLDL Cholesterol Cal: 18 mg/dL (ref 5–40)

## 2024-02-01 ENCOUNTER — Other Ambulatory Visit: Payer: Self-pay | Admitting: Nurse Practitioner

## 2024-02-10 ENCOUNTER — Ambulatory Visit

## 2024-02-12 ENCOUNTER — Ambulatory Visit: Attending: Nurse Practitioner | Admitting: Physical Therapy

## 2024-02-12 DIAGNOSIS — R2689 Other abnormalities of gait and mobility: Secondary | ICD-10-CM | POA: Insufficient documentation

## 2024-02-12 DIAGNOSIS — M25551 Pain in right hip: Secondary | ICD-10-CM | POA: Diagnosis not present

## 2024-02-12 DIAGNOSIS — M6281 Muscle weakness (generalized): Secondary | ICD-10-CM | POA: Diagnosis not present

## 2024-02-12 NOTE — Therapy (Signed)
 OUTPATIENT PHYSICAL THERAPY LOWER EXTREMITY EVALUATION   Patient Name: Alex Charles MRN: [id number] DOB:October 12, 1965, 58 y.o., male Today's Date: 02/12/2024  END OF SESSION:  PT End of Session - 02/12/24 0929     Visit Number 1    Number of Visits 7   with eval   Date for PT Re-Evaluation 05/06/24   to allow for scheduling delays   Authorization Type BCBS    PT Start Time [phone]    PT Stop Time 1015    PT Time Calculation (min) 47 min    Activity Tolerance Patient tolerated treatment well    Behavior During Therapy [facility] for tasks assessed/performed          Past Medical History:  Diagnosis Date   Alcoholism in recovery (HCC)    happened 4 years ago/ went thru detox in the hospital   Allergy    seasonal   Anxiety    Blockage of coronary artery of heart (HCC)    1 stent put in/ in 2015   CHF (congestive heart failure) (HCC)    Depression    Hyperlipidemia    Prediabetes    Past Surgical History:  Procedure Laterality Date   ABDOMINAL AORTOGRAM W/LOWER EXTREMITY N/A 03/02/2023   Procedure: ABDOMINAL AORTOGRAM W/LOWER EXTREMITY;  Surgeon: Court Dorn PARAS, MD;  Location: [facility];  Service: Cardiovascular;  Laterality: N/A;   ABDOMINAL AORTOGRAM W/LOWER EXTREMITY N/A 04/16/2023   Procedure: ABDOMINAL AORTOGRAM W/LOWER EXTREMITY;  Surgeon: Court Dorn PARAS, MD;  Location: [facility];  Service: Cardiovascular;  Laterality: N/A;   CORONARY ANGIOPLASTY WITH STENT PLACEMENT  2015   one stent   OTHER SURGICAL HISTORY     PERIPHERAL VASCULAR ATHERECTOMY  04/16/2023   Procedure: PERIPHERAL VASCULAR ATHERECTOMY;  Surgeon: Court Dorn PARAS, MD;  Location: [facility];  Service: Cardiovascular;;   tooth extracted     Patient Active Problem List   Diagnosis Date Noted   Claudication in peripheral vascular disease (HCC) 04/16/2023   Mediastinal mass 04/09/2022   Anxiety and depression 03/06/2022   Peripheral arterial disease (HCC) 01/07/2020    Neuropathy 04/09/2017   Depression 11/09/2015   Generalized anxiety disorder 11/09/2015   Tobacco dependence 11/09/2015   Chronic pain of right hip 07/04/2015   Acute right hip pain 05/30/2015   Sciatica neuralgia 05/30/2015   Hyperlipidemia 04/02/2015   Fatty liver 04/02/2015   Alcoholism in remission (HCC) 04/02/2015   Prediabetes 04/02/2015   Chronic systolic heart failure (HCC) 04/02/2015   Hip pain, chronic 04/02/2015   Elevated lipase 04/02/2015    PCP: Oley Bascom RAMAN, NP  REFERRING PROVIDER: Oley Bascom RAMAN, NP  REFERRING DIAG: [phone] (ICD-10-CM) - Right hip pain  THERAPY DIAG:  Muscle weakness (generalized)  Other abnormalities of gait and mobility  Pain in right hip  Rationale for Evaluation and Treatment: Rehabilitation  ONSET DATE: 01/20/2024  SUBJECTIVE:   SUBJECTIVE STATEMENT:  Pt reports that his R hip joint feels out of joint or out of whack. He denies any specific injury that he can recall that led to this pain, has been going on for about 2 months. He spoke with his PCP about it last month, had an xray performed which showed no fractures. He reports that walking is a struggle but he also has arthritis. He enjoys walking with a group on Sundays and this has been limited by his pain. He can make it through the walks but it is tough. Additionally, if he lifts or  turns his leg a certain way he will feel a sharp pain or a pinch, has trouble lifting his leg to roll over in bed and has difficulty lifting his leg (marching it). He is on his feet all day at work and has a general dull ache. During evaluation while leaning to his L side seated in a chair to get phone out of his R pocket he has onset of pain.   PERTINENT HISTORY: PMH: prediabetes, GAD, depression, PAD, PVD, neuropathy, HLD  PAIN:  Are you having pain? Yes: NPRS scale: 3/10 at rest (annoying), 7/10 at the worst Pain location: band at base of back comes around side of R hip Pain  description: sharp Aggravating factors: trying to turn leg to the side or tries to lift leg up (march) Relieving factors: changing out of painful position  PRECAUTIONS: None  RED FLAGS: None   WEIGHT BEARING RESTRICTIONS: No  FALLS:  Has patient fallen in last 6 months? 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  DIAGNOSTIC FINDINGS:   R Hip Xray 01/20/2024 FINDINGS: There is no evidence of hip fracture or dislocation of the right hip. No acute displaced fracture or dislocation of left hip on frontal view no acute displaced fracture or diastasis of the bones of the pelvis. There is no evidence of arthropathy or other focal bone abnormality. Vascular calcifications.   IMPRESSION: Negative for acute traumatic injury.  PATIENT SURVEYS:  None administered  COGNITION: Overall cognitive status: Within functional limits for tasks assessed     SENSATION: No N/T in legs Slightly decreased sensation in R lateral knee  EDEMA:  No swelling  MUSCLE LENGTH: Hamstrings: tight bilaterally  POSTURE: rounded shoulders and forward head  PALPATION: Trigger points along R piriformis and glutes, no real TTP however  LUMBAR ROM:   Active  A/PROM  eval  Flexion Limited by tight HS  Extension WFL  Right lateral flexion WFL  Left lateral flexion WFL  Right rotation WFL, slight pain on R side  Left rotation WFL   (Blank rows = not tested)   LOWER EXTREMITY ROM:  Active ROM Right eval Left eval  Hip flexion    Hip  extension    Hip abduction    Hip adduction    Hip internal rotation    Hip external rotation    Knee flexion    Knee extension Tight HS Tight HS  Ankle dorsiflexion    Ankle plantarflexion    Ankle inversion    Ankle eversion     (Blank rows = not tested)  LOWER EXTREMITY MMT:  MMT Right eval Left eval  Hip flexion 3- 5  Hip extension    Hip abduction 2- 4+  Hip adduction    Hip internal rotation    Hip external rotation    Knee flexion 5 5  Knee extension 5 5  Ankle dorsiflexion 5 5  Ankle plantarflexion    Ankle inversion    Ankle eversion     (Blank rows = not tested)  LOWER EXTREMITY SPECIAL TESTS:  Hip  special tests: Belvie (FABER) test: positive , Trendelenburg test: positive , Piriformis test: positive , and FAdIR (+)  FUNCTIONAL TESTS:  None assessed at time of eval  GAIT: Distance walked: various clinic distances Assistive device utilized: None Level of assistance: Modified independence Comments: antalgic                                                                                                                                TREATMENT DATE: PT Evaluation   Assessment of R hip pain: Flexion overpressure, ROM WFL and no pain FAbER, pain in R piriformis FAdIR, pain in R piriformis Piriformis test, pain in R piriformis Hip joint compression: no pain Hip joint distraction: relief of symptoms  TherEx Attempt seated R piriformis stretch, unable to get LE into position due to pain Modified stretch with use of strap 3 x 30 sec Supine R piriformis cross body stretch 3 x 30 sec  Added to initial HEP, see bolded below    PATIENT EDUCATION:  Education details: Eval findings, PT POC, initial HEP Person educated: Patient Education method: Explanation, Demonstration, Tactile cues, Verbal cues, and Handouts Education comprehension: verbalized understanding, returned demonstration, and needs further education  HOME EXERCISE PROGRAM: Access Code:  [email or web address] URL: [email or web address] Date: 02/12/2024 Prepared by: Waddell Southgate  Exercises - Supine Piriformis Stretch with Leg Straight  - 1 x daily - 7 x weekly - 1 sets - 3-5 reps - 30 sec hold - Seated Figure 4 Piriformis Stretch  - 1 x daily - 7 x weekly - 1 sets - 3-5 reps - 30 sec hold  ASSESSMENT:  CLINICAL IMPRESSION: Patient is a 58 year old male referred to Neuro OPPT for R hip pain x 2 months.   Pt's PMH is significant for: prediabetes, GAD, depression, PAD, PVD, neuropathy, HLD. The following deficits were present during the exam: decreased R hip flexor and abductor strength, antalgic gait, trigger points in R piriformis, and increased pain with muscle activation and positioning. Pt would benefit from skilled PT to address these impairments and functional limitations to maximize functional mobility independence.   OBJECTIVE IMPAIRMENTS: Abnormal gait, decreased activity tolerance, decreased knowledge of condition, difficulty walking, decreased ROM, decreased strength, impaired perceived functional ability, postural dysfunction, and pain.   ACTIVITY LIMITATIONS: transfers and bed mobility  PARTICIPATION LIMITATIONS: community activity  PERSONAL FACTORS: 3+ comorbidities:  prediabetes, GAD, depression, PAD, PVD, neuropathy, HLD are also affecting patient's functional outcome.   REHAB POTENTIAL: Excellent  CLINICAL DECISION MAKING: Stable/uncomplicated  EVALUATION COMPLEXITY: Low   GOALS: Goals reviewed with patient? Yes  SHORT TERM GOALS: Target date: 03/18/2024   Pt will be independent with initial HEP for improved strength, ROM, and management of pain symptoms. Baseline: Goal status: INITIAL   LONG TERM GOALS: Target date: 04/22/2024   Pt will be independent with final HEP for improved strength, ROM, and management of pain symptoms. Baseline:  Goal status: INITIAL  2.  Pt will increase  his muscle strength by 1 grade for improved  function Baseline: see eval Goal status: INITIAL  3.  Pt will report </= 5/10 pain at the most and 1/10 pain at rest for improved function Baseline: 7/10 at the most, 3/10 currently (8/22) Goal status: INITIAL  4.  Pt will report being able to walk x 30 min with pain </= 3/10 Baseline: 7/10 (8/22) Goal status: INITIAL     PLAN:  PT FREQUENCY: 1x/week  PT DURATION: 6 sessions (POC extended due to delayed start of scheduling 1st treatment)  PLANNED INTERVENTIONS: 97164- PT Re-evaluation, 97750- Physical Performance Testing, 97110-Therapeutic exercises, 97530- Therapeutic activity, [id number]- Neuromuscular re-education, 97535- Self Care, 02859- Manual therapy, [id number]- Gait training, [phone]- Orthotic Initial, [id number]- Orthotic/Prosthetic subsequent, [id number]- Aquatic Therapy, [phone]- Electrical stimulation (manual), [phone] (1-2 muscles), 20561 (3+ muscles)- Dry Needling, Patient/Family education, Balance training, Stair training, Taping, Joint mobilization, Spinal mobilization, DME instructions, Cryotherapy, and Moist heat  PLAN FOR NEXT SESSION: how is initial HEP? How are walks going? foam rolling, glute strength (clams, SL hip abd, butterfly, sidesteps, monster walks), HS stretches   Waddell Southgate, PT Waddell Southgate, PT, DPT, CSRS  02/12/2024, 10:22 AM

## 2024-02-19 ENCOUNTER — Other Ambulatory Visit: Payer: Self-pay | Admitting: Cardiology

## 2024-02-28 ENCOUNTER — Other Ambulatory Visit: Payer: Self-pay | Admitting: Nurse Practitioner

## 2024-03-09 ENCOUNTER — Ambulatory Visit: Attending: Nurse Practitioner | Admitting: Physical Therapy

## 2024-03-09 DIAGNOSIS — M25551 Pain in right hip: Secondary | ICD-10-CM | POA: Diagnosis not present

## 2024-03-09 DIAGNOSIS — M6281 Muscle weakness (generalized): Secondary | ICD-10-CM | POA: Insufficient documentation

## 2024-03-09 DIAGNOSIS — R2689 Other abnormalities of gait and mobility: Secondary | ICD-10-CM | POA: Diagnosis not present

## 2024-03-09 NOTE — Therapy (Signed)
 OUTPATIENT PHYSICAL THERAPY LOWER EXTREMITY TREATMENT   Patient Name: Alex Charles MRN: [id number] DOB:March 20, 1966, 58 y.o., male Today's Date: 03/09/2024  END OF SESSION:  PT End of Session - 03/09/24 0847     Visit Number 2    Number of Visits 7   with eval   Date for PT Re-Evaluation 05/06/24   to allow for scheduling delays   Authorization Type BCBS    PT Start Time 0845    PT Stop Time 0926    PT Time Calculation (min) 41 min    Activity Tolerance Patient tolerated treatment well    Behavior During Therapy [facility] for tasks assessed/performed           Past Medical History:  Diagnosis Date   Alcoholism in recovery (HCC)    happened 4 years ago/ went thru detox in the hospital   Allergy    seasonal   Anxiety    Blockage of coronary artery of heart (HCC)    1 stent put in/ in 2015   CHF (congestive heart failure) (HCC)    Depression    Hyperlipidemia    Prediabetes    Past Surgical History:  Procedure Laterality Date   ABDOMINAL AORTOGRAM W/LOWER EXTREMITY N/A 03/02/2023   Procedure: ABDOMINAL AORTOGRAM W/LOWER EXTREMITY;  Surgeon: Court Dorn PARAS, MD;  Location: [facility];  Service: Cardiovascular;  Laterality: N/A;   ABDOMINAL AORTOGRAM W/LOWER EXTREMITY N/A 04/16/2023   Procedure: ABDOMINAL AORTOGRAM W/LOWER EXTREMITY;  Surgeon: Court Dorn PARAS, MD;  Location: [facility];  Service: Cardiovascular;  Laterality: N/A;   CORONARY ANGIOPLASTY WITH STENT PLACEMENT  2015   one stent   OTHER SURGICAL HISTORY     PERIPHERAL VASCULAR ATHERECTOMY  04/16/2023   Procedure: PERIPHERAL VASCULAR ATHERECTOMY;  Surgeon: Court Dorn PARAS, MD;  Location: [facility];  Service: Cardiovascular;;   tooth extracted     Patient Active Problem List   Diagnosis Date Noted   Claudication in peripheral vascular disease (HCC) 04/16/2023   Mediastinal mass 04/09/2022   Anxiety and depression 03/06/2022   Peripheral arterial disease (HCC) 01/07/2020    Neuropathy 04/09/2017   Depression 11/09/2015   Generalized anxiety disorder 11/09/2015   Tobacco dependence 11/09/2015   Chronic pain of right hip 07/04/2015   Acute right hip pain 05/30/2015   Sciatica neuralgia 05/30/2015   Hyperlipidemia 04/02/2015   Fatty liver 04/02/2015   Alcoholism in remission (HCC) 04/02/2015   Prediabetes 04/02/2015   Chronic systolic heart failure (HCC) 04/02/2015   Hip pain, chronic 04/02/2015   Elevated lipase 04/02/2015    PCP: Oley Bascom RAMAN, NP  REFERRING PROVIDER: Oley Bascom RAMAN, NP  REFERRING DIAG: [phone] (ICD-10-CM) - Right hip pain  THERAPY DIAG:  Muscle weakness (generalized)  Other abnormalities of gait and mobility  Pain in right hip  Rationale for Evaluation and Treatment: Rehabilitation  ONSET DATE: 01/20/2024  SUBJECTIVE:   SUBJECTIVE STATEMENT:  Pt has been feeling more twinging in his R hip in the past few days, especially feels it with transition from sitting to standing. Pt does not recall any specific movements that may have caused the increase in twinges.  Pt feels like initial HEP has been helpful, exercises helped with twinges.   From initial eval: Pt reports that his R hip joint feels out of joint or out of whack. He denies any specific injury that he can recall that led to this pain, has been going on for about 2 months. He spoke with  his PCP about it last month, had an xray performed which showed no fractures. He reports that walking is a struggle but he also has arthritis. He enjoys walking with a group on Sundays and this has been limited by his pain. He can make it through the walks but it is tough. Additionally, if he lifts or turns his leg a certain way he will feel a sharp pain or a pinch, has trouble lifting his leg to roll over in bed and has difficulty lifting his leg (marching it). He is on his feet all day at work and has a general dull ache. During evaluation while leaning to his L side seated in a  chair to get phone out of his R pocket he has onset of pain.   PERTINENT HISTORY: PMH: prediabetes, GAD, depression, PAD, PVD, neuropathy, HLD  PAIN:  Are you having pain? Yes: NPRS scale: 3/10 at rest (annoying), 7/10 at the worst Pain location: band at base of back comes around side of R hip Pain description: sharp Aggravating factors: trying to turn leg to the side or tries to lift leg up (march) Relieving factors: changing out of painful position  PRECAUTIONS: None  RED FLAGS: None   WEIGHT BEARING RESTRICTIONS: No  FALLS:  Has patient fallen in last 6 months? 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  DIAGNOSTIC FINDINGS:   R Hip Xray 01/20/2024 FINDINGS: There is no evidence of hip fracture or dislocation of the right hip. No acute displaced fracture or dislocation of left hip on frontal view no acute displaced fracture or diastasis of the bones of the pelvis. There is no evidence of arthropathy or other focal bone abnormality. Vascular calcifications.   IMPRESSION: Negative for acute traumatic injury.  PATIENT SURVEYS:  None administered  COGNITION: Overall cognitive status: Within functional limits for tasks assessed     SENSATION: No N/T in legs Slightly decreased sensation in R lateral knee  EDEMA:  No swelling  MUSCLE LENGTH: Hamstrings: tight bilaterally  POSTURE: rounded shoulders and forward head  PALPATION: Trigger points along R piriformis and glutes, no real TTP however  LUMBAR  ROM:   Active  A/PROM  eval  Flexion Limited by tight HS  Extension WFL  Right lateral flexion WFL  Left lateral flexion WFL  Right rotation WFL, slight pain on R side  Left rotation WFL   (Blank rows = not tested)   LOWER EXTREMITY ROM:  Active ROM Right eval Left eval  Hip flexion    Hip extension    Hip abduction    Hip adduction    Hip internal rotation    Hip external rotation    Knee flexion    Knee extension Tight HS Tight HS  Ankle dorsiflexion    Ankle plantarflexion    Ankle inversion    Ankle eversion     (Blank rows = not tested)  LOWER EXTREMITY MMT:  MMT Right eval Left eval  Hip flexion 3- 5  Hip extension    Hip abduction 2- 4+  Hip adduction  Hip internal rotation    Hip external rotation    Knee flexion 5 5  Knee extension 5 5  Ankle dorsiflexion 5 5  Ankle plantarflexion    Ankle inversion    Ankle eversion     (Blank rows = not tested)  LOWER EXTREMITY SPECIAL TESTS:  Hip special tests: Belvie (FABER) test: positive , Trendelenburg test: positive , Piriformis test: positive , and FAdIR (+)   Assessment of R hip pain: Flexion overpressure, ROM WFL and no pain FAbER, pain in R piriformis FAdIR, pain in R piriformis Piriformis test, pain in R piriformis Hip joint compression: no pain Hip joint distraction: relief of symptoms  FUNCTIONAL TESTS:  None assessed at time of eval  GAIT: Distance walked: various clinic distances Assistive device utilized: None Level of assistance: Modified independence Comments: antalgic                                                                                                                                TREATMENT DATE:   TherAct Palpation of R hip/glute region Pt with pain/tenderness with palpation in R ischial tuberosity region and along his glutes and obturator internus and piriformis as well as tenderness at greater trochanter Pain with PROM of RLE into hip  extension   TherEx To address LE muscle pain and tightness: Long sitting HS stretch 3 x 30 sec Supine R hip butterfly stretch 3 x 30 sec TPR with tennis ball in supine position   To address R glut weakness: Sidelying clams 2 x 10 reps Initial onset of pain but decreases with progression of reps Supine bridges 2 x 10 reps with 5 sec hold  Added to HEP, see bolded below  PATIENT EDUCATION:  Education details: continue HEP and added to HEP Person educated: Patient Education method: Programmer, multimedia, Demonstration, Actor cues, Verbal cues, and Handouts Education comprehension: verbalized understanding, returned demonstration, and needs further education  HOME EXERCISE PROGRAM: Access Code: [email or web address] URL: [email or web address] Date: 02/12/2024 Prepared by: Waddell Southgate  Exercises - Supine Piriformis Stretch with Leg Straight  - 1 x daily - 7 x weekly - 1 sets - 3-5 reps - 30 sec hold - Seated Figure 4 Piriformis Stretch  - 1 x daily - 7 x weekly - 1 sets - 3-5 reps - 30 sec hold - Long Sitting Hamstring Stretch  - 1 x daily - 7 x weekly - 1 sets - 3-5 reps - 30 sec hold - Supine Butterfly Groin Stretch  - 1 x daily - 7 x weekly - 1 sets - 3-5 reps - 30 sec hold - Clamshell  - 1 x daily - 7 x weekly - 3 sets - 10 reps - Supine Bridge  - 1 x daily - 7 x weekly - 3 sets - 10 reps - 5 sec hold  Verbally added trigger point release with tennis ball  ASSESSMENT:  CLINICAL IMPRESSION: Emphasis of skilled PT session on palpation of  painful region in R hip/glute region with noted tenderness from R IT to greater trochanter. Session focus on stretching of tight/painful muscles and on glute strengthening for improved joint stability. Pt with good tolerance to exercises this date. He continues to benefit from skilled PT services to work towards increased independence with management of symptoms. Continue POC.    OBJECTIVE IMPAIRMENTS: Abnormal gait, decreased activity  tolerance, decreased knowledge of condition, difficulty walking, decreased ROM, decreased strength, impaired perceived functional ability, postural dysfunction, and pain.   ACTIVITY LIMITATIONS: transfers and bed mobility  PARTICIPATION LIMITATIONS: community activity  PERSONAL FACTORS: 3+ comorbidities:  prediabetes, GAD, depression, PAD, PVD, neuropathy, HLD are also affecting patient's functional outcome.   REHAB POTENTIAL: Excellent  CLINICAL DECISION MAKING: Stable/uncomplicated  EVALUATION COMPLEXITY: Low   GOALS: Goals reviewed with patient? Yes  SHORT TERM GOALS: Target date: 03/18/2024   Pt will be independent with initial HEP for improved strength, ROM, and management of pain symptoms. Baseline: Goal status: INITIAL   LONG TERM GOALS: Target date: 04/22/2024   Pt will be independent with final HEP for improved strength, ROM, and management of pain symptoms. Baseline:  Goal status: INITIAL  2.  Pt will increase his muscle strength by 1 grade for improved function Baseline: see eval Goal status: INITIAL  3.  Pt will report </= 5/10 pain at the most and 1/10 pain at rest for improved function Baseline: 7/10 at the most, 3/10 currently (8/22) Goal status: INITIAL  4.  Pt will report being able to walk x 30 min with pain </= 3/10 Baseline: 7/10 (8/22) Goal status: INITIAL     PLAN:  PT FREQUENCY: 1x/week  PT DURATION: 6 sessions (POC extended due to delayed start of scheduling 1st treatment)  PLANNED INTERVENTIONS: 97164- PT Re-evaluation, 97750- Physical Performance Testing, 97110-Therapeutic exercises, 97530- Therapeutic activity, [id number]- Neuromuscular re-education, 97535- Self Care, 02859- Manual therapy, [id number]- Gait training, [phone]- Orthotic Initial, [id number]- Orthotic/Prosthetic subsequent, [id number]- Aquatic Therapy, [phone]- Electrical stimulation (manual), [phone] (1-2 muscles), 20561 (3+ muscles)- Dry Needling, Patient/Family education, Balance training, Stair  training, Taping, Joint mobilization, Spinal mobilization, DME instructions, Cryotherapy, and Moist heat  PLAN FOR NEXT SESSION: how is HEP? How are walks going? Twinges in R hip? foam rolling, glute strength (clams, SL hip abd, butterfly, sidesteps, monster walks), HS stretches, hip hike on step, SLS, progression to higher level glute strengthening   Waddell Southgate, PT Waddell Southgate, PT, DPT, CSRS  03/09/2024, 9:27 AM

## 2024-03-14 ENCOUNTER — Other Ambulatory Visit: Payer: Self-pay | Admitting: Cardiology

## 2024-03-16 ENCOUNTER — Ambulatory Visit: Admitting: Physical Therapy

## 2024-03-16 DIAGNOSIS — R2689 Other abnormalities of gait and mobility: Secondary | ICD-10-CM | POA: Diagnosis not present

## 2024-03-16 DIAGNOSIS — M25551 Pain in right hip: Secondary | ICD-10-CM | POA: Diagnosis not present

## 2024-03-16 DIAGNOSIS — M6281 Muscle weakness (generalized): Secondary | ICD-10-CM | POA: Diagnosis not present

## 2024-03-16 NOTE — Therapy (Signed)
 OUTPATIENT PHYSICAL THERAPY LOWER EXTREMITY TREATMENT   Patient Name: Alex Charles MRN: [id number] DOB:09/14/1965, 58 y.o., male Today's Date: 03/16/2024  END OF SESSION:  PT End of Session - 03/16/24 0847     Visit Number 3    Number of Visits 7   with eval   Date for Recertification  05/06/24   to allow for scheduling delays   Authorization Type [employer]    Authorization Time Period 7 visits 8/22-11/5    Authorization - Visit Number 3    Authorization - Number of Visits 7    PT Start Time 0845    PT Stop Time 0927    PT Time Calculation (min) 42 min    Equipment Utilized During Treatment Gait belt    Activity Tolerance Patient tolerated treatment well    Behavior During Therapy WFL for tasks assessed/performed            Past Medical History:  Diagnosis Date   Alcoholism in recovery (HCC)    happened 4 years ago/ went thru detox in the hospital   Allergy    seasonal   Anxiety    Blockage of coronary artery of heart (HCC)    1 stent put in/ in 2015   CHF (congestive heart failure) (HCC)    Depression    Hyperlipidemia    Prediabetes    Past Surgical History:  Procedure Laterality Date   ABDOMINAL AORTOGRAM W/LOWER EXTREMITY N/A 03/02/2023   Procedure: ABDOMINAL AORTOGRAM W/LOWER EXTREMITY;  Surgeon: Court Dorn PARAS, MD;  Location: [facility];  Service: Cardiovascular;  Laterality: N/A;   ABDOMINAL AORTOGRAM W/LOWER EXTREMITY N/A 04/16/2023   Procedure: ABDOMINAL AORTOGRAM W/LOWER EXTREMITY;  Surgeon: Court Dorn PARAS, MD;  Location: [facility];  Service: Cardiovascular;  Laterality: N/A;   CORONARY ANGIOPLASTY WITH STENT PLACEMENT  2015   one stent   OTHER SURGICAL HISTORY     PERIPHERAL VASCULAR ATHERECTOMY  04/16/2023   Procedure: PERIPHERAL VASCULAR ATHERECTOMY;  Surgeon: Court Dorn PARAS, MD;  Location: [facility];  Service: Cardiovascular;;   tooth extracted     Patient Active Problem List   Diagnosis Date Noted   Claudication  in peripheral vascular disease 04/16/2023   Mediastinal mass 04/09/2022   Anxiety and depression 03/06/2022   Peripheral arterial disease 01/07/2020   Neuropathy 04/09/2017   Depression 11/09/2015   Generalized anxiety disorder 11/09/2015   Tobacco dependence 11/09/2015   Chronic pain of right hip 07/04/2015   Acute right hip pain 05/30/2015   Sciatica neuralgia 05/30/2015   Hyperlipidemia 04/02/2015   Fatty liver 04/02/2015   Alcoholism in remission (HCC) 04/02/2015   Prediabetes 04/02/2015   Chronic systolic heart failure (HCC) 04/02/2015   Hip pain, chronic 04/02/2015   Elevated lipase 04/02/2015    PCP: Oley Bascom RAMAN, NP  REFERRING PROVIDER: Oley Bascom RAMAN, NP  REFERRING DIAG: [phone] (ICD-10-CM) - Right hip pain  THERAPY DIAG:  Muscle weakness (generalized)  Other abnormalities of gait and mobility  Pain in right hip  Rationale for Evaluation and Treatment: Rehabilitation  ONSET DATE: 01/20/2024  SUBJECTIVE:   SUBJECTIVE STATEMENT:  Pt has been feeling good since last visit, pain is no longer sharp and acute but more sore/ache like muscles are being stretched and worked. He does still have twingeing in his R hip, describes it as a soft burn in muscle that wraps around hip; feels it more when he is moving. He denies any falls since last visit.  He feels  like the exercises so far have been very good, tennis ball has been very helpful. He has his walking group this Sunday, asking for tips on how to prepare for the walk.   From initial eval: Pt reports that his R hip joint feels out of joint or out of whack. He denies any specific injury that he can recall that led to this pain, has been going on for about 2 months. He spoke with his PCP about it last month, had an xray performed which showed no fractures. He reports that walking is a struggle but he also has arthritis. He enjoys walking with a group on Sundays and this has been limited by his pain. He can  make it through the walks but it is tough. Additionally, if he lifts or turns his leg a certain way he will feel a sharp pain or a pinch, has trouble lifting his leg to roll over in bed and has difficulty lifting his leg (marching it). He is on his feet all day at work and has a general dull ache. During evaluation while leaning to his L side seated in a chair to get phone out of his R pocket he has onset of pain.   PERTINENT HISTORY: PMH: prediabetes, GAD, depression, PAD, PVD, neuropathy, HLD  PAIN:  Are you having pain? Yes: NPRS scale: 3/10 at rest (annoying), 7/10 at the worst Pain location: band at base of back comes around side of R hip Pain description: sharp Aggravating factors: trying to turn leg to the side or tries to lift leg up (march) Relieving factors: changing out of painful position  PRECAUTIONS: None  RED FLAGS: None   WEIGHT BEARING RESTRICTIONS: No  FALLS:  Has patient fallen in last 6 months? 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  DIAGNOSTIC FINDINGS:   R Hip Xray 01/20/2024 FINDINGS: There is no evidence of hip fracture or dislocation of the right hip. No acute displaced fracture or dislocation of left hip on frontal view no acute displaced fracture or diastasis of the bones of the pelvis. There is no evidence of arthropathy or other focal bone abnormality. Vascular calcifications.   IMPRESSION: Negative for acute traumatic injury.  PATIENT SURVEYS:  None  administered  COGNITION: Overall cognitive status: Within functional limits for tasks assessed     SENSATION: No N/T in legs Slightly decreased sensation in R lateral knee  EDEMA:  No swelling  MUSCLE LENGTH: Hamstrings: tight bilaterally  POSTURE: rounded shoulders and forward head  PALPATION: Trigger points along R piriformis and glutes, no real TTP however  LUMBAR ROM:   Active  A/PROM  eval  Flexion Limited by tight HS  Extension WFL  Right lateral flexion WFL  Left lateral flexion WFL  Right rotation WFL, slight pain on R side  Left rotation WFL   (Blank rows = not tested)   LOWER EXTREMITY ROM:  Active ROM Right eval Left eval  Hip flexion    Hip extension    Hip abduction    Hip adduction    Hip  internal rotation    Hip external rotation    Knee flexion    Knee extension Tight HS Tight HS  Ankle dorsiflexion    Ankle plantarflexion    Ankle inversion    Ankle eversion     (Blank rows = not tested)  LOWER EXTREMITY MMT:  MMT Right eval Left eval  Hip flexion 3- 5  Hip extension    Hip abduction 2- 4+  Hip adduction    Hip internal rotation    Hip external rotation    Knee flexion 5 5  Knee extension 5 5  Ankle dorsiflexion 5 5  Ankle plantarflexion    Ankle inversion    Ankle eversion     (Blank rows = not tested)  LOWER EXTREMITY SPECIAL TESTS:  Hip special tests: Belvie (FABER) test: positive , Trendelenburg test: positive , Piriformis test: positive , and FAdIR (+)   Assessment of R hip pain: Flexion overpressure, ROM WFL and no pain FAbER, pain in R piriformis FAdIR, pain in R piriformis Piriformis test, pain in R piriformis Hip joint compression: no pain Hip joint distraction: relief of symptoms  FUNCTIONAL TESTS:  None assessed at time of eval  GAIT: Distance walked: various clinic distances Assistive device utilized: None Level of assistance: Modified independence Comments: antalgic                                                                                                                                 TREATMENT DATE:    TherAct To address R glut weakness: Standing alt L/R gumdrop taps on airex 3 x 10 reps B Improved stability as task progresses Lateral sidestepping resisted with red TB 3 x 10 ft L/R Resisted gumdrop taps (5 gumdrops in semi-circle) with red TB 3 x 10 reps B 6 hip hikes/lateral eccentric step downs 3 x 10 reps B Iinitially has a twinge in R hip that improves as sets progress  Added appropriate exercises to HEP, see bolded below  PATIENT EDUCATION:  Education details: continue HEP and added to HEP, how to prepare for upcoming walk (stretching before and after, staying hydrated - water and electrolytes, walking poles if needed) Person educated: Patient Education method: Explanation, Demonstration, Tactile cues, Verbal cues, and Handouts Education comprehension: verbalized understanding, returned demonstration, and needs further education  HOME EXERCISE PROGRAM: Access Code: [email or web address] URL: [email or web address] Date: 02/12/2024 Prepared by: Waddell Southgate  Exercises - Supine Piriformis Stretch with Leg Straight  - 1 x daily - 7 x weekly - 1 sets - 3-5 reps - 30 sec hold - Seated Figure 4 Piriformis Stretch  - 1 x daily - 7 x weekly - 1 sets - 3-5 reps - 30 sec hold - Long Sitting Hamstring Stretch  - 1 x daily - 7 x weekly - 1 sets - 3-5 reps - 30 sec hold - Supine Butterfly Groin Stretch  - 1 x daily - 7 x weekly - 1 sets -  3-5 reps - 30 sec hold - Clamshell  - 1 x daily - 7 x weekly - 3 sets - 10 reps - Supine Bridge  - 1 x daily - 7 x weekly - 3 sets - 10 reps - 5 sec hold  Verbally added trigger point release with tennis ball  - Side Stepping with Resistance at Feet  - 1 x daily - 7 x weekly - 3 sets - 10 reps - Standing Lateral Step-Down Heel Tap  - 1 x daily - 7 x weekly - 3 sets - 10 reps - Sideways Step Touch  - 1 x daily - 7 x weekly - 3  sets - 10 reps  ASSESSMENT:  CLINICAL IMPRESSION: Emphasis of skilled PT session on progressing to standing R hip strengthening exercises with focus on resistance training and SLS. Pt with good tolerance for all exercises performed this date, initially with some twingeing of his R hip that improves as exercises progress.  He continues to benefit from skilled PT services to work towards increased independence with management of symptoms and ability to return to community level fitness without increase in pain. Pt has met 1/1 STG due to being independent with his initial HEP. Continue POC.    OBJECTIVE IMPAIRMENTS: Abnormal gait, decreased activity tolerance, decreased knowledge of condition, difficulty walking, decreased ROM, decreased strength, impaired perceived functional ability, postural dysfunction, and pain.   ACTIVITY LIMITATIONS: transfers and bed mobility  PARTICIPATION LIMITATIONS: community activity  PERSONAL FACTORS: 3+ comorbidities:  prediabetes, GAD, depression, PAD, PVD, neuropathy, HLD are also affecting patient's functional outcome.   REHAB POTENTIAL: Excellent  CLINICAL DECISION MAKING: Stable/uncomplicated  EVALUATION COMPLEXITY: Low   GOALS: Goals reviewed with patient? Yes  SHORT TERM GOALS: Target date: 03/18/2024   Pt will be independent with initial HEP for improved strength, ROM, and management of pain symptoms. Baseline: Goal status: MET   LONG TERM GOALS: Target date: 04/22/2024   Pt will be independent with final HEP for improved strength, ROM, and management of pain symptoms. Baseline:  Goal status: INITIAL  2.  Pt will increase his muscle strength by 1 grade for improved function Baseline: see eval Goal status: INITIAL  3.  Pt will report </= 5/10 pain at the most and 1/10 pain at rest for improved function Baseline: 7/10 at the most, 3/10 currently (8/22) Goal status: INITIAL  4.  Pt will report being able to walk x 30 min with pain </=  3/10 Baseline: 7/10 (8/22) Goal status: INITIAL     PLAN:  PT FREQUENCY: 1x/week  PT DURATION: 6 sessions (POC extended due to delayed start of scheduling 1st treatment)  PLANNED INTERVENTIONS: [id number]- PT Re-evaluation, 97750- Physical Performance Testing, 97110-Therapeutic exercises, 97530- Therapeutic activity, [id number]- Neuromuscular re-education, 97535- Self Care, [id number]- Manual therapy, [id number]- Gait training, [phone]- Orthotic Initial, [id number]- Orthotic/Prosthetic subsequent, [id number]- Aquatic Therapy, [phone]- Electrical stimulation (manual), [phone] (1-2 muscles), [id number] (3+ muscles)- Dry Needling, Patient/Family education, Balance training, Stair training, Taping, Joint mobilization, Spinal mobilization, DME instructions, Cryotherapy, and Moist heat  PLAN FOR NEXT SESSION: how is HEP? How are walks going? Twinges in R hip? foam rolling (figure 4), glute strength (SL bridge, monster walks, squats), HS stretches, seated cross over glute stretch, SLS, standing ball kicks and stops   Gurbani Figge, PT Waddell Southgate, PT, DPT, CSRS  03/16/2024, 9:40 AM

## 2024-03-20 ENCOUNTER — Other Ambulatory Visit: Payer: Self-pay | Admitting: Nurse Practitioner

## 2024-03-20 DIAGNOSIS — J302 Other seasonal allergic rhinitis: Secondary | ICD-10-CM

## 2024-03-23 ENCOUNTER — Ambulatory Visit: Attending: Nurse Practitioner | Admitting: Physical Therapy

## 2024-03-23 DIAGNOSIS — M6281 Muscle weakness (generalized): Secondary | ICD-10-CM | POA: Insufficient documentation

## 2024-03-23 DIAGNOSIS — M25551 Pain in right hip: Secondary | ICD-10-CM | POA: Insufficient documentation

## 2024-03-23 DIAGNOSIS — R2689 Other abnormalities of gait and mobility: Secondary | ICD-10-CM | POA: Diagnosis not present

## 2024-03-23 NOTE — Therapy (Signed)
 OUTPATIENT PHYSICAL THERAPY LOWER EXTREMITY TREATMENT   Patient Name: Alex Charles MRN: [id number] DOB:1965/07/14, 58 y.o., male Today's Date: 03/23/2024  END OF SESSION:  PT End of Session - 03/23/24 1107     Visit Number 4    Number of Visits 7   with eval   Date for Recertification  05/06/24   to allow for scheduling delays   Authorization Type [employer]    Authorization Time Period 7 visits 8/22-11/5    Authorization - Number of Visits 7    PT Start Time 1105    PT Stop Time 1150    PT Time Calculation (min) 45 min    Equipment Utilized During Treatment Gait belt    Activity Tolerance Patient tolerated treatment well    Behavior During Therapy WFL for tasks assessed/performed             Past Medical History:  Diagnosis Date   Alcoholism in recovery (HCC)    happened 4 years ago/ went thru detox in the hospital   Allergy    seasonal   Anxiety    Blockage of coronary artery of heart (HCC)    1 stent put in/ in 2015   CHF (congestive heart failure) (HCC)    Depression    Hyperlipidemia    Prediabetes    Past Surgical History:  Procedure Laterality Date   ABDOMINAL AORTOGRAM W/LOWER EXTREMITY N/A 03/02/2023   Procedure: ABDOMINAL AORTOGRAM W/LOWER EXTREMITY;  Surgeon: Court Dorn PARAS, MD;  Location: [facility];  Service: Cardiovascular;  Laterality: N/A;   ABDOMINAL AORTOGRAM W/LOWER EXTREMITY N/A 04/16/2023   Procedure: ABDOMINAL AORTOGRAM W/LOWER EXTREMITY;  Surgeon: Court Dorn PARAS, MD;  Location: [facility];  Service: Cardiovascular;  Laterality: N/A;   CORONARY ANGIOPLASTY WITH STENT PLACEMENT  2015   one stent   OTHER SURGICAL HISTORY     PERIPHERAL VASCULAR ATHERECTOMY  04/16/2023   Procedure: PERIPHERAL VASCULAR ATHERECTOMY;  Surgeon: Court Dorn PARAS, MD;  Location: [facility];  Service: Cardiovascular;;   tooth extracted     Patient Active Problem List   Diagnosis Date Noted   Claudication in peripheral vascular disease  04/16/2023   Mediastinal mass 04/09/2022   Anxiety and depression 03/06/2022   Peripheral arterial disease 01/07/2020   Neuropathy 04/09/2017   Depression 11/09/2015   Generalized anxiety disorder 11/09/2015   Tobacco dependence 11/09/2015   Chronic pain of right hip 07/04/2015   Acute right hip pain 05/30/2015   Sciatica neuralgia 05/30/2015   Hyperlipidemia 04/02/2015   Fatty liver 04/02/2015   Alcoholism in remission (HCC) 04/02/2015   Prediabetes 04/02/2015   Chronic systolic heart failure (HCC) 04/02/2015   Hip pain, chronic 04/02/2015   Elevated lipase 04/02/2015    PCP: Oley Bascom RAMAN, NP  REFERRING PROVIDER: Oley Bascom RAMAN, NP  REFERRING DIAG: [phone] (ICD-10-CM) - Right hip pain  THERAPY DIAG:  Muscle weakness (generalized)  Other abnormalities of gait and mobility  Pain in right hip  Rationale for Evaluation and Treatment: Rehabilitation  ONSET DATE: 01/20/2024  SUBJECTIVE:   SUBJECTIVE STATEMENT:  Pt did not attend his walking group this weekend but he did have the weekend off and it was nice and recuperative. He reports that the prescribed exercises have improved his activity tolerance. He does continue to have pain his R hip at times. For example, when trying to turn over in bed he feels the pain in his R hip and it wakes him up. He has done some  walking outside of his walking group, some days are good and some days are bad in terms of pain.    From initial eval: Pt reports that his R hip joint feels out of joint or out of whack. He denies any specific injury that he can recall that led to this pain, has been going on for about 2 months. He spoke with his PCP about it last month, had an xray performed which showed no fractures. He reports that walking is a struggle but he also has arthritis. He enjoys walking with a group on Sundays and this has been limited by his pain. He can make it through the walks but it is tough. Additionally, if he lifts or  turns his leg a certain way he will feel a sharp pain or a pinch, has trouble lifting his leg to roll over in bed and has difficulty lifting his leg (marching it). He is on his feet all day at work and has a general dull ache. During evaluation while leaning to his L side seated in a chair to get phone out of his R pocket he has onset of pain.   PERTINENT HISTORY: PMH: prediabetes, GAD, depression, PAD, PVD, neuropathy, HLD  PAIN:  Are you having pain? Yes: NPRS scale: 3/10 at rest (annoying), 7/10 at the worst Pain location: band at base of back comes around side of R hip Pain description: sharp Aggravating factors: trying to turn leg to the side or tries to lift leg up (march) Relieving factors: changing out of painful position  PRECAUTIONS: None  RED FLAGS: None   WEIGHT BEARING RESTRICTIONS: No  FALLS:  Has patient fallen in last 6 months? 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  DIAGNOSTIC FINDINGS:   R Hip Xray 01/20/2024 FINDINGS: There is no evidence of hip fracture or dislocation of the right hip. No acute displaced fracture or dislocation of left hip on frontal view no acute displaced fracture or diastasis of the bones of the pelvis. There is no evidence of arthropathy or other focal bone abnormality. Vascular calcifications.   IMPRESSION: Negative for acute traumatic injury.  PATIENT SURVEYS:  None administered  COGNITION: Overall cognitive status: Within functional limits for tasks  assessed     SENSATION: No N/T in legs Slightly decreased sensation in R lateral knee  EDEMA:  No swelling  MUSCLE LENGTH: Hamstrings: tight bilaterally  POSTURE: rounded shoulders and forward head  PALPATION: Trigger points along R piriformis and glutes, no real TTP however  LUMBAR ROM:   Active  A/PROM  eval  Flexion Limited by tight HS  Extension WFL  Right lateral flexion WFL  Left lateral flexion WFL  Right rotation WFL, slight pain on R side  Left rotation WFL   (Blank rows = not tested)   LOWER EXTREMITY ROM:  Active ROM Right eval Left eval  Hip flexion    Hip extension    Hip abduction    Hip adduction    Hip internal rotation    Hip external rotation    Knee  flexion    Knee extension Tight HS Tight HS  Ankle dorsiflexion    Ankle plantarflexion    Ankle inversion    Ankle eversion     (Blank rows = not tested)  LOWER EXTREMITY MMT:  MMT Right eval Left eval  Hip flexion 3- 5  Hip extension    Hip abduction 2- 4+  Hip adduction    Hip internal rotation    Hip external rotation    Knee flexion 5 5  Knee extension 5 5  Ankle dorsiflexion 5 5  Ankle plantarflexion    Ankle inversion    Ankle eversion     (Blank rows = not tested)  LOWER EXTREMITY SPECIAL TESTS:  Hip special tests: Belvie (FABER) test: positive , Trendelenburg test: positive , Piriformis test: positive , and FAdIR (+)   Assessment of R hip pain: Flexion overpressure, ROM WFL and no pain FAbER, pain in R piriformis FAdIR, pain in R piriformis Piriformis test, pain in R piriformis Hip joint compression: no pain Hip joint distraction: relief of symptoms  FUNCTIONAL TESTS:  None assessed at time of eval  GAIT: Distance walked: various clinic distances Assistive device utilized: None Level of assistance: Modified independence Comments: antalgic                                                                                                                                 TREATMENT DATE:    TherAct To address R glut weakness and work on functional LE strengthening and balance strategies as well as SLS: Standing alt L/R ball soccer ball stop and return kicks 2 x 10 reps B Improved stability as task progresses with progression from BUE support to no UE support Resisted monster walks with red TB 6 x 10 ft, no UE support Pt feels this in his glutes and his quads Added to HEP, see bolded below Lateral sidestepping resisted with red TB along blue foam beam 3 x 10 ft L/R Pt feels activation of his tib ant Lateral sidestepping along blue foam beam performing alt L/R gumdrop taps  3 x 10 ft L/R   SciFit multi-peaks level 5 for 5 minutes using BUE/BLEs for neural priming for reciprocal movement, dynamic cardiovascular warmup and increased amplitude of stepping.   PATIENT EDUCATION:  Education details: continue HEP and added to HEP Person educated: Patient Education method: Explanation, Demonstration, Actor cues, Verbal cues, and Handouts Education comprehension: verbalized understanding, returned demonstration, and needs further education  HOME EXERCISE PROGRAM: Access Code: [email or web address] URL: [email or web address] Date: 02/12/2024 Prepared by: Waddell Southgate  Exercises - Supine Piriformis Stretch with Leg Straight  - 1 x daily - 7 x weekly - 1 sets - 3-5 reps - 30 sec hold - Seated Figure 4 Piriformis Stretch  - 1 x daily - 7 x weekly - 1 sets - 3-5 reps - 30 sec hold - Long Sitting Hamstring Stretch  - 1 x daily - 7 x  weekly - 1 sets - 3-5 reps - 30 sec hold - Supine Butterfly Groin Stretch  - 1 x daily - 7 x weekly - 1 sets - 3-5 reps - 30 sec hold - Clamshell  - 1 x daily - 7 x weekly - 3 sets - 10 reps - Supine Bridge  - 1 x daily - 7 x weekly - 3 sets - 10 reps - 5 sec hold  Verbally added trigger point release with tennis ball  - Side Stepping with Resistance at Feet  - 1 x daily - 7 x weekly - 3 sets - 10 reps - Standing  Lateral Step-Down Heel Tap  - 1 x daily - 7 x weekly - 3 sets - 10 reps - Sideways Step Touch  - 1 x daily - 7 x weekly - 3 sets - 10 reps - Forward Monster Walks  - 1 x daily - 7 x weekly - 3 sets - 10 reps  ASSESSMENT:  CLINICAL IMPRESSION: Emphasis of skilled PT session on continuing to work on R hip strengthening, SLS stability, dynamic balance and use of ankle strategy for balance recovery, and functional LE strengthening. He is challenged by exercises this visit with minor R hip twinges. He continues to benefit from skilled PT services to work towards increased independence with management of symptoms and ability to return to community level fitness without increase in pain. Continue POC.    OBJECTIVE IMPAIRMENTS: Abnormal gait, decreased activity tolerance, decreased knowledge of condition, difficulty walking, decreased ROM, decreased strength, impaired perceived functional ability, postural dysfunction, and pain.   ACTIVITY LIMITATIONS: transfers and bed mobility  PARTICIPATION LIMITATIONS: community activity  PERSONAL FACTORS: 3+ comorbidities:  prediabetes, GAD, depression, PAD, PVD, neuropathy, HLD are also affecting patient's functional outcome.   REHAB POTENTIAL: Excellent  CLINICAL DECISION MAKING: Stable/uncomplicated  EVALUATION COMPLEXITY: Low   GOALS: Goals reviewed with patient? Yes  SHORT TERM GOALS: Target date: 03/18/2024   Pt will be independent with initial HEP for improved strength, ROM, and management of pain symptoms. Baseline: Goal status: MET   LONG TERM GOALS: Target date: 04/22/2024   Pt will be independent with final HEP for improved strength, ROM, and management of pain symptoms. Baseline:  Goal status: INITIAL  2.  Pt will increase his muscle strength by 1 grade for improved function Baseline: see eval Goal status: INITIAL  3.  Pt will report </= 5/10 pain at the most and 1/10 pain at rest for improved function Baseline: 7/10 at the  most, 3/10 currently (8/22) Goal status: INITIAL  4.  Pt will report being able to walk x 30 min with pain </= 3/10 Baseline: 7/10 (8/22) Goal status: INITIAL     PLAN:  PT FREQUENCY: 1x/week  PT DURATION: 6 sessions (POC extended due to delayed start of scheduling 1st treatment)  PLANNED INTERVENTIONS: 97164- PT Re-evaluation, 97750- Physical Performance Testing, 97110-Therapeutic exercises, 97530- Therapeutic activity, [id number]- Neuromuscular re-education, 97535- Self Care, [id number]- Manual therapy, [id number]- Gait training, [phone]- Orthotic Initial, [id number]- Orthotic/Prosthetic subsequent, [id number]- Aquatic Therapy, [phone]- Electrical stimulation (manual), [phone] (1-2 muscles), 20561 (3+ muscles)- Dry Needling, Patient/Family education, Balance training, Stair training, Taping, Joint mobilization, Spinal mobilization, DME instructions, Cryotherapy, and Moist heat  PLAN FOR NEXT SESSION: how is HEP? How are walks going? Twinges in R hip? foam rolling (figure 4), glute strength (SL bridge, squats), HS stretches, seated cross over glute stretch, SLS on compliant surface, standing ball kicks and stops (from airex?), quadruped fire hydrants? SL deadlift  Waddell Southgate, PT Waddell Southgate, PT, DPT, CSRS  03/23/2024, 12:03 PM

## 2024-03-28 NOTE — Progress Notes (Addendum)
 Psychiatric Initial Adult Assessment   Patient Identification: Alex Charles MRN:  [id number] Date of Evaluation:  04/06/2024 Referral Source: PCP Chief Complaint:   Chief Complaint  Patient presents with   Establish Care   Depression   Anxiety   Visit Diagnosis:    ICD-10-CM   1. Anxiety and depression  F41.9 sertraline (ZOLOFT) 25 MG tablet   F32.A        Assessment:  Alex Charles is a 58 y.o. male with a history of MDD, GAD who presents in person to [facility] at [facility] for initial evaluation on 04/06/2024.    At initial evaluation patient reports feeling depressed and anxious due to ongoing severe anxiety about losing his job.  His depression is well-controlled on buspirone  and hydroxyzine  however there are days when he has low energy, decreased interest in daily activities and passive suicidal ideations without a plan.  He is not actively suicidal or homicidal and has never attempted suicide in the past.  He was very circumstantial during the assessment.  He does have good coping skills, has remained sober for 9 years and quit smoking in the past 1 year. There is a likely psychosocial component to current symptoms as he transitions to ego integrity versus despair.  Today we were able to do some reality versus thought process check.  He has done well in the past with therapy, encouraged him to reach out to his therapist also provided him with a list of therapists that he could reach out to. For persistent anxiety and symptoms of depression, will start him on Zoloft 25 mg and continue rest of the same medications for anxiety. Discussed sleep hygiene.   Will also discussed that he could take trazodone  25 mg on days he does not want to take 50 mg due to drowsiness.  We will have him back in clinic in 4 weeks.  A number of assessments were performed during the evaluation today including  PHQ-9 which they scored a 14 on, GAD-7 which they scored a 15 on, and  [place] suicide severity screening which showed low risk.    Risk Assessment: A suicide and violence risk assessment was performed as part of this evaluation. There patient is deemed to be at chronic elevated risk for self-harm/suicide given the following factors: N/A. These risk factors are mitigated by the following factors: lack of active SI/HI, no known access to weapons or firearms, no history of previous suicide attempts, and no history of violence. The patient is deemed to be at chronic elevated risk for violence given the following factors: N/A. These risk factors are mitigated by the following factors: no known history of violence towards others, no known violence towards others in the last 6 months, no known history of threats of harm towards others, no known homicidal ideation in the last 6 months, no command hallucinations to harm others in the last 6 months, no active symptoms of psychosis, and no active symptoms of mania. There is no acute risk for suicide or violence at this time. The patient was educated about relevant modifiable risk factors including following recommendations for treatment of psychiatric illness and abstaining from substance abuse.  While future psychiatric events cannot be accurately predicted, the patient does not currently require  acute inpatient psychiatric care and does not currently meet [place]  involuntary commitment criteria.  Patient was given contact information for crisis resources, behavioral health clinic and was instructed to call 911 for emergencies.    Plan: # MDD without  psychotic features vs. Adjustment disorder with depressed mood Past medication trials: Buspirone  and hydroxyzine  and trazodone  Status of problem: New to the writer Interventions: -- Start Zoloft 25 mg daily -- Continue trazodone  50 mg nightly as needed for sleep, can take 25 mg as needed if causing excessive drowsiness  # GAD Past medication trials: Buspirone  and  hydroxyzine  Status of problem: New to the writer Interventions: -- Continue buspirone   History of Present Illness:   Alex Charles is a 58 year old male with a history of MDD, GAD that presented to the clinic today to establish care. He reported that the reason for visit is that his nurse practitioner recommended him to come for psychiatric services as I was having a bad reaction on my medications that includes overwhelming paranoia and anxiety .  Reported that he still has a lot of paranoia about losing my job .  Reported that he worked at [employer] for 9 years  I have to do something really bad to be fired.  He denied any active or passive SI/HI/AVH.  He also stated that there are times I pray I did not get up, relating that to worsening paranoia.   I get very lonely, reported that he has joined a group of elderly every Thursday which has been helping him with his mood.  He endorsed feeling depressed with low energy at times, stating that I am going to be happy , reported that it is stemming from the paranoia.  Also reported feeling anxious due to similar concerns but reported that buspirone  and hydroxyzine  combination have helped.  He stated that on his days of I could watch TV for hours .  He reported that he is 9 years sober from alcohol, was previously in [facility] and after moving here he has not used alcohol.  Also reported that he used to smoke and quit smoking about 10 years ago and is currently on Chantix .  He drinks about 2 cups of coffee every day.  Reported that he enjoys walking, reading, and listening to music however he has not done that recently.  Reported good sleep on trazodone , fragmented otherwise an adequate appetite with no recent changes.  He reported that he would like to be on medications, discussed about starting Zoloft 25 mg daily for mood, patient amenable to the plan.  Discussed the risk benefits and side effects of the medications.  He reported that he does  not take trazodone  daily as it causes him drowsiness, encouraged him to take 25 mg to stabilize his sleep schedule, patient amenable.  Also encouraged therapy, patient was previously in therapy, encouraged to reach out to his therapist.  Also provided him with resources today.  He has been prescribed other medications by his primary care provider, reported enough refills.  We will have him back in clinic in 4 weeks.  Associated Signs/Symptoms: Depression Symptoms:  depressed mood, insomnia, feelings of worthlessness/guilt, (Hypo) Manic Symptoms:  None Anxiety Symptoms:  Excessive Worry, Psychotic Symptoms:  None PTSD Symptoms: Negative  Past Psychiatric History:  Past psychiatric diagnoses: MDD, GAD Psychiatric hospitalizations:Denies Past suicide attempts: Denies Hx of self harm: Denies Hx of violence towards others: Denies Prior psychiatric providers: Denies Prior therapy: Denies Access to firearms: Denies  Prior medication trials: Buspirone  30 mg, Trazodone  50 mg  Substance use: Denies  Past Medical History:  Past Medical History:  Diagnosis Date   Alcoholism in recovery (HCC)    happened 4 years ago/ went thru detox in the hospital   Allergy  seasonal   Anxiety    Blockage of coronary artery of heart (HCC)    1 stent put in/ in 2015   CHF (congestive heart failure) (HCC)    Depression    Hyperlipidemia    Prediabetes     Past Surgical History:  Procedure Laterality Date   ABDOMINAL AORTOGRAM W/LOWER EXTREMITY N/A 03/02/2023   Procedure: ABDOMINAL AORTOGRAM W/LOWER EXTREMITY;  Surgeon: Court Dorn PARAS, MD;  Location: [facility] INVASIVE [facility];  Service: Cardiovascular;  Laterality: N/A;   ABDOMINAL AORTOGRAM W/LOWER EXTREMITY N/A 04/16/2023   Procedure: ABDOMINAL AORTOGRAM W/LOWER EXTREMITY;  Surgeon: Court Dorn PARAS, MD;  Location: [facility];  Service: Cardiovascular;  Laterality: N/A;   CORONARY ANGIOPLASTY WITH STENT PLACEMENT  2015   one stent   OTHER  SURGICAL HISTORY     PERIPHERAL VASCULAR ATHERECTOMY  04/16/2023   Procedure: PERIPHERAL VASCULAR ATHERECTOMY;  Surgeon: Court Dorn PARAS, MD;  Location: [facility];  Service: Cardiovascular;;   tooth extracted      Family Psychiatric History: NS  Family History:  Family History  Problem Relation Age of Onset   Bladder Cancer Mother    Crohn's disease Mother    Skin cancer Mother    Hypertension Father    Irritable bowel syndrome Sister     Social History:   Social History   Socioeconomic History   Marital status: Single    Spouse name: Not on file   Number of children: Not on file   Years of education: Not on file   Highest education level: Not on file  Occupational History   Not on file  Tobacco Use   Smoking status: Former    Current packs/day: 0.00    Average packs/day: 0.2 packs/day for 0.1 years    Types: Cigarettes    Start date: 03/25/2023    Quit date: 05/04/2023    Years since quitting: 0.9   Smokeless tobacco: Never   Tobacco comments:    Patient completed health coaching for smoking cessation.  Vaping Use   Vaping status: Never Used  Substance and Sexual Activity   Alcohol use: No   Drug use: Never   Sexual activity: Not Currently  Other Topics Concern   Not on file  Social History Narrative   Not on file   Social Drivers of Health   Financial Resource Strain: Not on file  Food Insecurity: No Food Insecurity (07/29/2023)   Hunger Vital Sign    Worried About Running Out of Food in the Last Year: Never true    Ran Out of Food in the Last Year: Never true  Transportation Needs: No Transportation Needs (07/29/2023)   PRAPARE - Administrator, Civil Service (Medical): No    Lack of Transportation (Non-Medical): No  Physical Activity: Not on file  Stress: Not on file  Social Connections: Not on file    Additional Social History: 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  Allergies:  No Known Allergies  Metabolic  Disorder Labs: Lab Results  Component Value Date   HGBA1C 6.1 (A) 01/20/2024   MPG 120 (H) 04/02/2015   No results found for: PROLACTIN Lab Results  Component Value Date   CHOL 113 01/20/2024   TRIG 93 01/20/2024   HDL 38 (L) 01/20/2024   CHOLHDL 3.0 01/20/2024   VLDL 25 04/17/2023   LDLCALC 57 01/20/2024   LDLCALC 26 04/17/2023   Lab Results  Component Value Date   TSH 1.880 01/04/2020  Therapeutic Level Labs: No results found for: LITHIUM No results found for: CBMZ No results found for: VALPROATE  Current Medications: Current Outpatient Medications  Medication Sig Dispense Refill   sertraline (ZOLOFT) 25 MG tablet Take 1 tablet (25 mg total) by mouth daily. 30 tablet 1   albuterol  (VENTOLIN  HFA) 108 (90 Base) MCG/ACT inhaler TAKE 2 PUFFS BY MOUTH EVERY 6 HOURS AS NEEDED FOR WHEEZE OR SHORTNESS OF BREATH 8.5 each 0   aspirin  EC 81 MG tablet Take 1 tablet (81 mg total) by mouth daily. 30 tablet 11   busPIRone  (BUSPAR ) 30 MG tablet TAKE 1 TABLET BY MOUTH TWICE A DAY 180 tablet 2   carboxymethylcellulose (REFRESH PLUS) 0.5 % SOLN 1 drop 3 (three) times daily as needed (dry/irritated eyes.).     cetirizine  (ZYRTEC ) 10 MG tablet TAKE 1 TABLET BY MOUTH EVERY DAY 90 tablet 3   cilostazol  (PLETAL ) 100 MG tablet Take 1 tablet (100 mg total) by mouth 2 (two) times daily. 180 tablet 3   clopidogrel  (PLAVIX ) 75 MG tablet TAKE 1 TABLET BY MOUTH DAILY WITH BREAKFAST 90 tablet 3   Cyanocobalamin (VITAMIN B-12 PO) Take 1 tablet by mouth daily as needed (energy).     diclofenac  (VOLTAREN ) 75 MG EC tablet TAKE 1 TABLET BY MOUTH 2 TIMES DAILY AS NEEDED. 180 tablet 1   diclofenac  Sodium (VOLTAREN ) 1 % GEL Apply 4 g topically 4 (four) times daily. Apply to affected areas 4 times daily as needed for pain. 100 g 2   ezetimibe  (ZETIA ) 10 MG tablet TAKE 1 TABLET BY MOUTH EVERY DAY APPOINTMENT FOR REFILLS 90 tablet 1   fenofibrate  160 MG tablet TAKE 1 TABLET BY MOUTH EVERY DAY 90 tablet 1    hydrOXYzine  (ATARAX ) 10 MG tablet TAKE 1 TABLET BY MOUTH THREE TIMES A DAY AS NEEDED 270 tablet 2   metoprolol  succinate (TOPROL -XL) 25 MG 24 hr tablet TAKE 1 TABLET (25 MG TOTAL) BY MOUTH DAILY. 90 tablet 2   rosuvastatin  (CRESTOR ) 40 MG tablet TAKE 1 TABLET BY MOUTH EVERY DAY 90 tablet 3   traZODone  (DESYREL ) 50 MG tablet TAKE 1 TABLET BY MOUTH AT BEDTIME AS NEEDED FOR SLEEP 90 tablet 3   varenicline  (CHANTIX  CONTINUING MONTH PAK) 1 MG tablet Take 1 tablet (1 mg total) by mouth 2 (two) times daily. 180 tablet 1   varenicline  (CHANTIX ) 1 MG tablet Take 1 tablet (1 mg total) by mouth 2 (two) times daily. 60 tablet 3   No current facility-administered medications for this visit.    Musculoskeletal: Strength & Muscle Tone: within normal limits Gait & Station: normal Patient leans: N/A  Psychiatric Specialty Exam:  Psychiatric Specialty Exam: Blood pressure 110/75, pulse 62, height 5' 8 (1.727 m), weight 212 lb (96.2 kg).Body mass index is 32.23 kg/m. Review of Systems  General Appearance: Casual and Fairly Groomed  Eye Contact:  Good  Speech:  Clear and Coherent and Normal Rate  Volume:  Normal  Mood:  Euthymic  Affect:  Appropriate and Congruent  Thought Content: Logical   Suicidal Thoughts:  No  Homicidal Thoughts:  No  Thought Process:  Goal Directed  Orientation:  Full (Time, Place, and Person)    Memory: Immediate;   Good Recent;   Good Remote;   Good  Judgment:  Fair  Insight:  Fair  Concentration:  Concentration: Good and Attention Span: Good  Recall:  not formally assessed   Fund of Knowledge: Good  Language: Good  Psychomotor Activity:  Normal  Akathisia:  No  AIMS (if indicated): not done  Assets:  Communication Skills Desire for Improvement Financial Resources/Insurance Housing Intimacy Leisure Time Physical Health Resilience Transportation  ADL's:  Intact  Cognition: WNL  Sleep:  Fair    Screenings: GAD-7    Flowsheet Row Office Visit from  [facility] Office Visit from 01/20/2024 in [facility] Dept Of [facility] Office Visit from 01/14/2023 in [facility] Of [facility] Office Visit from 08/08/2020 in [facility] - [facility] Dept Of [facility] Office Visit from 12/10/2015 in [facility] Of [facility]  Total GAD-7 Score 15 10 7  0 6   PHQ2-9    Flowsheet Row Office Visit from 04/06/2024 in [facility] Office Visit from 01/20/2024 in [facility] Of [facility] Office Visit from 07/22/2023 in [facility] Of [facility] Office Visit from 01/14/2023 in [facility] - A Dept Of [facility] Office Visit from 10/08/2022 in [facility] - [facility] Dept Of [facility]  PHQ-2 Total Score 4 3 0 2 6  PHQ-9 Total Score 14 8 -- 6 15   Flowsheet Row Office Visit from 04/06/2024 in [facility] UC from 10[facility]14/2023 in [facility]) Admission (Discharged) from 04/16/2023 in [facility]  C-SSRS RISK CATEGORY Low Risk No Risk No Risk     Collaboration of Care: Other Dr. Mercy, PCP notes  Patient/Guardian was advised Release of Information must be obtained prior to any record release in order to collaborate their care with an outside provider. Patient/Guardian was advised if they have not already done so to contact the registration department to sign all necessary forms in order for us  to release information regarding their care.   Consent: Patient/Guardian gives verbal consent for treatment and assignment of benefits for services provided during this visit.  Patient/Guardian expressed understanding and agreed to proceed.   Rena Hunke, MD 10/15/20253:09 PM

## 2024-03-30 ENCOUNTER — Encounter: Payer: Self-pay | Admitting: Dermatology

## 2024-03-30 ENCOUNTER — Ambulatory Visit: Admitting: Physical Therapy

## 2024-03-30 ENCOUNTER — Ambulatory Visit: Admitting: Dermatology

## 2024-03-30 VITALS — BP 108/76 | HR 66

## 2024-03-30 DIAGNOSIS — D239 Other benign neoplasm of skin, unspecified: Secondary | ICD-10-CM

## 2024-03-30 DIAGNOSIS — M6281 Muscle weakness (generalized): Secondary | ICD-10-CM | POA: Diagnosis not present

## 2024-03-30 DIAGNOSIS — C44311 Basal cell carcinoma of skin of nose: Secondary | ICD-10-CM

## 2024-03-30 DIAGNOSIS — D229 Melanocytic nevi, unspecified: Secondary | ICD-10-CM

## 2024-03-30 DIAGNOSIS — M25551 Pain in right hip: Secondary | ICD-10-CM | POA: Diagnosis not present

## 2024-03-30 DIAGNOSIS — L578 Other skin changes due to chronic exposure to nonionizing radiation: Secondary | ICD-10-CM | POA: Diagnosis not present

## 2024-03-30 DIAGNOSIS — D1801 Hemangioma of skin and subcutaneous tissue: Secondary | ICD-10-CM

## 2024-03-30 DIAGNOSIS — L821 Other seborrheic keratosis: Secondary | ICD-10-CM

## 2024-03-30 DIAGNOSIS — W908XXA Exposure to other nonionizing radiation, initial encounter: Secondary | ICD-10-CM

## 2024-03-30 DIAGNOSIS — Z1283 Encounter for screening for malignant neoplasm of skin: Secondary | ICD-10-CM | POA: Diagnosis not present

## 2024-03-30 DIAGNOSIS — D485 Neoplasm of uncertain behavior of skin: Secondary | ICD-10-CM

## 2024-03-30 DIAGNOSIS — L814 Other melanin hyperpigmentation: Secondary | ICD-10-CM

## 2024-03-30 DIAGNOSIS — L570 Actinic keratosis: Secondary | ICD-10-CM

## 2024-03-30 DIAGNOSIS — Z86018 Personal history of other benign neoplasm: Secondary | ICD-10-CM

## 2024-03-30 DIAGNOSIS — R2689 Other abnormalities of gait and mobility: Secondary | ICD-10-CM | POA: Diagnosis not present

## 2024-03-30 DIAGNOSIS — C4491 Basal cell carcinoma of skin, unspecified: Secondary | ICD-10-CM

## 2024-03-30 HISTORY — DX: Basal cell carcinoma of skin, unspecified: C44.91

## 2024-03-30 NOTE — Progress Notes (Signed)
 Follow-Up Visit   Subjective  Alex Charles is a 58 y.o. male who presents for the following: Skin Cancer Screening and Full Body Skin Exam  The patient presents for Total-Body Skin Exam (TBSE) for skin cancer screening and mole check. The patient has spots, moles and lesions to be evaluated, some may be new or changing.  Patient does not spend much time outside, but does use 30 SPF on his face and arms when outside. Patient reports that he does not wear shorts in the summer. He drives a scooter and is always wearing a helmet.   The following portions of the chart were reviewed this encounter and updated as appropriate: medications, allergies, medical history  Review of Systems:  No other skin or systemic complaints except as noted in HPI or Assessment and Plan.  Objective  Well appearing patient in no apparent distress; mood and affect are within normal limits.  A full examination was performed including scalp, head, eyes, ears, nose, lips, neck, chest, axillae, abdomen, back, buttocks, bilateral upper extremities, bilateral lower extremities, hands, feet, fingers, toes, fingernails, and toenails. All findings within normal limits unless otherwise noted below.   Relevant physical exam findings are noted in the Assessment and Plan.  Mid Tip of Nose 5mm sclerotic papule with central crusting  Left Forehead Erythematous thin papules/macules with gritty scale.   Assessment & Plan   SKIN CANCER SCREENING PERFORMED TODAY.  ACTINIC DAMAGE - Chronic condition, secondary to cumulative UV/sun exposure - diffuse scaly erythematous macules with underlying dyspigmentation - Recommend daily broad spectrum sunscreen SPF 30+ to sun-exposed areas, reapply every 2 hours as needed.  - Staying in the shade or wearing long sleeves, sun glasses (UVA+UVB protection) and wide brim hats (4-inch brim around the entire circumference of the hat) are also recommended for sun protection.  - Call for new  or changing lesions.  LENTIGINES, SEBORRHEIC KERATOSES, HEMANGIOMAS - Benign normal skin lesions - Benign-appearing - Call for any changes  MELANOCYTIC NEVI - Tan-brown and/or pink-flesh-colored symmetric macules and papules - Benign appearing on exam today - Observation - Call clinic for new or changing moles - Recommend daily use of broad spectrum spf 30+ sunscreen to sun-exposed areas.   History of Dysplastic Nevi - No evidence of recurrence today - Recommend regular full body skin exams - Recommend daily broad spectrum sunscreen SPF 30+ to sun-exposed areas, reapply every 2 hours as needed.  - Call if any new or changing lesions are noted between office visits  SEBORRHEIC KERATOSIS Right Forearm - Posterior NEOPLASM OF UNCERTAIN BEHAVIOR OF SKIN Mid Tip of Nose Skin / nail biopsy Type of biopsy: tangential   Informed consent: discussed and consent obtained   Timeout: patient name, date of birth, surgical site, and procedure verified   Procedure prep:  Patient was prepped and draped in usual sterile fashion Prep type:  Isopropyl alcohol Anesthesia: the lesion was anesthetized in a standard fashion   Anesthetic:  1% lidocaine  w/ epinephrine 1-100,000 buffered w/ 8.4% NaHCO3 Instrument used: DermaBlade   Hemostasis achieved with: aluminum chloride   Outcome: patient tolerated procedure well   Post-procedure details: sterile dressing applied and wound care instructions given   Dressing type: bandage and petrolatum    Specimen 1 - Surgical pathology Differential Diagnosis: r/o NMSC vs other  Check Margins: No AK (ACTINIC KERATOSIS) Left Forehead Destruction of lesion - Left Forehead Complexity: simple   Destruction method: cryotherapy   Informed consent: discussed and consent obtained   Timeout:  patient  name, date of birth, surgical site, and procedure verified Lesion destroyed using liquid nitrogen: Yes   Region frozen until ice ball extended beyond lesion: Yes    Outcome: patient tolerated procedure well with no complications   Post-procedure details: wound care instructions given    Return in about 6 months (around 09/28/2024) for FBSE.  [name], RN, am acting as scribe for [name], MD .   Documentation: I have reviewed the above documentation for accuracy and completeness, and I agree with the above.  [name], MD

## 2024-03-30 NOTE — Therapy (Addendum)
 OUTPATIENT PHYSICAL THERAPY LOWER EXTREMITY TREATMENT   Patient Name: Alex Charles MRN: [id number] DOB:1966-01-21, 58 y.o., male Today's Date: 03/30/2024  END OF SESSION:  PT End of Session - 03/30/24 0850     Visit Number 5    Number of Visits 7   with eval   Date for Recertification  05/06/24   to allow for scheduling delays   Authorization Type [employer]    Authorization Time Period 7 visits 8/22-11/5    Authorization - Number of Visits 7    PT Start Time [phone]   pt arrived late   PT Stop Time 0931    PT Time Calculation (min) 42 min    Equipment Utilized During Treatment Gait belt    Activity Tolerance Patient tolerated treatment well    Behavior During Therapy WFL for tasks assessed/performed              Past Medical History:  Diagnosis Date   Alcoholism in recovery (HCC)    happened 4 years ago/ went thru detox in the hospital   Allergy    seasonal   Anxiety    Blockage of coronary artery of heart (HCC)    1 stent put in/ in 2015   CHF (congestive heart failure) (HCC)    Depression    Hyperlipidemia    Prediabetes    Past Surgical History:  Procedure Laterality Date   ABDOMINAL AORTOGRAM W/LOWER EXTREMITY N/A 03/02/2023   Procedure: ABDOMINAL AORTOGRAM W/LOWER EXTREMITY;  Surgeon: Court Dorn PARAS, MD;  Location: [facility];  Service: Cardiovascular;  Laterality: N/A;   ABDOMINAL AORTOGRAM W/LOWER EXTREMITY N/A 04/16/2023   Procedure: ABDOMINAL AORTOGRAM W/LOWER EXTREMITY;  Surgeon: Court Dorn PARAS, MD;  Location: [facility];  Service: Cardiovascular;  Laterality: N/A;   CORONARY ANGIOPLASTY WITH STENT PLACEMENT  2015   one stent   OTHER SURGICAL HISTORY     PERIPHERAL VASCULAR ATHERECTOMY  04/16/2023   Procedure: PERIPHERAL VASCULAR ATHERECTOMY;  Surgeon: Court Dorn PARAS, MD;  Location: [facility];  Service: Cardiovascular;;   tooth extracted     Patient Active Problem List   Diagnosis Date Noted   Claudication in peripheral  vascular disease 04/16/2023   Mediastinal mass 04/09/2022   Anxiety and depression 03/06/2022   Peripheral arterial disease 01/07/2020   Neuropathy 04/09/2017   Depression 11/09/2015   Generalized anxiety disorder 11/09/2015   Tobacco dependence 11/09/2015   Chronic pain of right hip 07/04/2015   Acute right hip pain 05/30/2015   Sciatica neuralgia 05/30/2015   Hyperlipidemia 04/02/2015   Fatty liver 04/02/2015   Alcoholism in remission (HCC) 04/02/2015   Prediabetes 04/02/2015   Chronic systolic heart failure (HCC) 04/02/2015   Hip pain, chronic 04/02/2015   Elevated lipase 04/02/2015    PCP: Oley Bascom RAMAN, NP  REFERRING PROVIDER: Oley Bascom RAMAN, NP  REFERRING DIAG: [phone] (ICD-10-CM) - Right hip pain  THERAPY DIAG:  Muscle weakness (generalized)  Other abnormalities of gait and mobility  Pain in right hip  Rationale for Evaluation and Treatment: Rehabilitation  ONSET DATE: 01/20/2024  SUBJECTIVE:   SUBJECTIVE STATEMENT:  Pt reports he has been feeling better since last visit, less tight. Pt reports his pain is okay today, alright. Pt reports he felt really good after last session of PT. 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   From initial eval: Pt reports that his R hip joint feels out of  joint or out of whack. He denies any specific injury that he can recall that led to this pain, has been going on for about 2 months. He spoke with his PCP about it last month, had an xray performed which showed no fractures. He reports that walking is a struggle but he also has arthritis. He enjoys walking with a group on Sundays and this has been limited by his pain. He can make it through the walks but it is tough. Additionally, if he lifts or turns his leg a certain way he will feel a sharp pain or a pinch, has trouble lifting his leg to roll over in bed and has difficulty lifting his leg (marching it). He is on his feet all  day at work and has a general dull ache. During evaluation while leaning to his L side seated in a chair to get phone out of his R pocket he has onset of pain.   PERTINENT HISTORY: PMH: prediabetes, GAD, depression, PAD, PVD, neuropathy, HLD  PAIN:  Are you having pain? Yes: NPRS scale: 3/10 at rest (annoying), 7/10 at the worst Pain location: band at base of back comes around side of R hip Pain description: sharp Aggravating factors: trying to turn leg to the side or tries to lift leg up (march) Relieving factors: changing out of painful position  PRECAUTIONS: None  RED FLAGS: None   WEIGHT BEARING RESTRICTIONS: No  FALLS:  Has patient fallen in last 6 months? 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  DIAGNOSTIC FINDINGS:   R Hip Xray 01/20/2024 FINDINGS: There is no evidence of hip fracture or dislocation of the right hip. No acute displaced fracture or dislocation of left hip on frontal view no acute displaced fracture or diastasis of the bones of the pelvis. There is no evidence of arthropathy or other focal bone abnormality. Vascular calcifications.   IMPRESSION: Negative for acute traumatic injury.  PATIENT SURVEYS:  None administered  COGNITION: Overall cognitive status: Within functional limits for tasks assessed     SENSATION: No N/T in legs Slightly decreased sensation in R lateral knee  EDEMA:  No swelling  MUSCLE LENGTH: Hamstrings: tight bilaterally  POSTURE: rounded shoulders and  forward head  PALPATION: Trigger points along R piriformis and glutes, no real TTP however  LUMBAR ROM:   Active  A/PROM  eval  Flexion Limited by tight HS  Extension WFL  Right lateral flexion WFL  Left lateral flexion WFL  Right rotation WFL, slight pain on R side  Left rotation WFL   (Blank rows = not tested)   LOWER EXTREMITY ROM:  Active ROM Right eval Left eval  Hip flexion    Hip extension    Hip abduction    Hip adduction    Hip internal rotation    Hip external rotation    Knee flexion    Knee extension Tight HS Tight HS  Ankle dorsiflexion    Ankle plantarflexion    Ankle inversion    Ankle eversion     (Blank rows =  not tested)  LOWER EXTREMITY MMT:  MMT Right eval Left eval  Hip flexion 3- 5  Hip extension    Hip abduction 2- 4+  Hip adduction    Hip internal rotation    Hip external rotation    Knee flexion 5 5  Knee extension 5 5  Ankle dorsiflexion 5 5  Ankle plantarflexion    Ankle inversion    Ankle eversion     (Blank rows = not tested)  LOWER EXTREMITY SPECIAL TESTS:  Hip special tests: Belvie (FABER) test: positive , Trendelenburg test: positive , Piriformis test: positive , and FAdIR (+)   Assessment of R hip pain: Flexion overpressure, ROM WFL and no pain FAbER, pain in R piriformis FAdIR, pain in R piriformis Piriformis test, pain in R piriformis Hip joint compression: no pain Hip joint distraction: relief of symptoms  FUNCTIONAL TESTS:  None assessed at time of eval  GAIT: Distance walked: various clinic distances Assistive device utilized: None Level of assistance: Modified independence Comments: antalgic                                                                                                                                TREATMENT DATE:   TherAct Treadmill level 1.0 mph x 10 min for dynamic cardiovascular warmup. Pt with mild aches/pains during gait but no overt increase in his pain. No RPE given but  pt states he exerted some effort but not max effort.  To work on BLE strengthening: Leg press with BLE: X 10 reps at 70#, 2 x 10 reps at 80# Leg press with RLE: 3 x 10 reps at 40# Leg press with LLE: 3 x 10 reps at 60# Standing SL [place] dead lift X 10 reps B with 10# KB   PATIENT EDUCATION:  Education details: continue HEP Person educated: Patient Education method: Explanation, Demonstration, Tactile cues, and Verbal cues Education comprehension: verbalized understanding, returned demonstration, and needs further education  HOME EXERCISE PROGRAM: Access Code: [email or web address] URL: [email or web address] Date: 02/12/2024 Prepared by: Waddell Southgate  Exercises - Supine Piriformis Stretch with Leg Straight  - 1 x daily - 7 x weekly - 1 sets - 3-5 reps - 30 sec hold - Seated Figure 4 Piriformis Stretch  - 1 x daily - 7 x weekly - 1 sets - 3-5 reps - 30 sec hold - Long Sitting Hamstring Stretch  - 1 x daily - 7 x weekly - 1 sets - 3-5 reps - 30 sec hold - Supine Butterfly Groin Stretch  - 1 x daily - 7 x weekly - 1 sets - 3-5 reps - 30 sec hold - Clamshell  - 1 x daily - 7 x weekly - 3 sets - 10 reps - Supine Bridge  - 1 x daily - 7 x weekly - 3 sets - 10 reps - 5 sec hold  Verbally added trigger point release with tennis ball  - Side Stepping with Resistance  at Feet  - 1 x daily - 7 x weekly - 3 sets - 10 reps - Standing Lateral Step-Down Heel Tap  - 1 x daily - 7 x weekly - 3 sets - 10 reps - Sideways Step Touch  - 1 x daily - 7 x weekly - 3 sets - 10 reps - Forward Monster Walks  - 1 x daily - 7 x weekly - 3 sets - 10 reps  ASSESSMENT:  CLINICAL IMPRESSION: Emphasis of skilled PT session on continuing to work on R hip strengthening as well as functional BLE strengthening and SLS stability. He is challenged by exercises this visit but no significant increase in pain with exercise, just feels muscle soreness. He continues to benefit from skilled PT services to work  towards increased independence with management of symptoms and ability to return to community level fitness without increase in pain. Continue POC.    OBJECTIVE IMPAIRMENTS: Abnormal gait, decreased activity tolerance, decreased knowledge of condition, difficulty walking, decreased ROM, decreased strength, impaired perceived functional ability, postural dysfunction, and pain.   ACTIVITY LIMITATIONS: transfers and bed mobility  PARTICIPATION LIMITATIONS: community activity  PERSONAL FACTORS: 3+ comorbidities:  prediabetes, GAD, depression, PAD, PVD, neuropathy, HLD are also affecting patient's functional outcome.   REHAB POTENTIAL: Excellent  CLINICAL DECISION MAKING: Stable/uncomplicated  EVALUATION COMPLEXITY: Low   GOALS: Goals reviewed with patient? Yes  SHORT TERM GOALS: Target date: 03/18/2024   Pt will be independent with initial HEP for improved strength, ROM, and management of pain symptoms. Baseline: Goal status: MET   LONG TERM GOALS: Target date: 04/22/2024   Pt will be independent with final HEP for improved strength, ROM, and management of pain symptoms. Baseline:  Goal status: INITIAL  2.  Pt will increase his muscle strength by 1 grade for improved function Baseline: see eval Goal status: INITIAL  3.  Pt will report </= 5/10 pain at the most and 1/10 pain at rest for improved function Baseline: 7/10 at the most, 3/10 currently (8/22) Goal status: INITIAL  4.  Pt will report being able to walk x 30 min with pain </= 3/10 Baseline: 7/10 (8/22) Goal status: INITIAL     PLAN:  PT FREQUENCY: 1x/week  PT DURATION: 6 sessions (POC extended due to delayed start of scheduling 1st treatment)  PLANNED INTERVENTIONS: [id number]- PT Re-evaluation, 97750- Physical Performance Testing, 97110-Therapeutic exercises, 97530- Therapeutic activity, [id number]- Neuromuscular re-education, 97535- Self Care, [id number]- Manual therapy, [id number]- Gait training, [phone]- Orthotic Initial,  [id number]- Orthotic/Prosthetic subsequent, [id number]- Aquatic Therapy, [phone]- Electrical stimulation (manual), [phone] (1-2 muscles), [id number] (3+ muscles)- Dry Needling, Patient/Family education, Balance training, Stair training, Taping, Joint mobilization, Spinal mobilization, DME instructions, Cryotherapy, and Moist heat  PLAN FOR NEXT SESSION: how is HEP? How are walks going? Twinges in R hip? foam rolling (figure 4), glute strength (SL bridge, squats), HS stretches, seated cross over glute stretch, SLS on compliant surface, standing ball kicks and stops (from airex?), quadruped fire hydrants? SL deadlift; treadmill + incline or increased speed, seated on swiss ball   Waddell Southgate, PT Waddell Southgate, PT, DPT, CSRS  03/30/2024, 9:32 AM

## 2024-03-30 NOTE — Patient Instructions (Addendum)

## 2024-03-31 LAB — SURGICAL PATHOLOGY

## 2024-04-01 ENCOUNTER — Ambulatory Visit: Payer: Self-pay | Admitting: Dermatology

## 2024-04-06 ENCOUNTER — Ambulatory Visit: Admitting: Physical Therapy

## 2024-04-06 ENCOUNTER — Ambulatory Visit (HOSPITAL_COMMUNITY): Payer: Self-pay

## 2024-04-06 VITALS — BP 110/75 | HR 62 | Ht 68.0 in | Wt 212.0 lb

## 2024-04-06 DIAGNOSIS — R2689 Other abnormalities of gait and mobility: Secondary | ICD-10-CM

## 2024-04-06 DIAGNOSIS — F419 Anxiety disorder, unspecified: Secondary | ICD-10-CM

## 2024-04-06 DIAGNOSIS — M25551 Pain in right hip: Secondary | ICD-10-CM

## 2024-04-06 DIAGNOSIS — F32A Depression, unspecified: Secondary | ICD-10-CM | POA: Diagnosis not present

## 2024-04-06 DIAGNOSIS — M6281 Muscle weakness (generalized): Secondary | ICD-10-CM

## 2024-04-06 MED ORDER — SERTRALINE HCL 25 MG PO TABS: 25.0000 mg | ORAL_TABLET | Freq: Every day | ORAL | 1 refills | Status: DC

## 2024-04-06 NOTE — Therapy (Signed)
 OUTPATIENT PHYSICAL THERAPY LOWER EXTREMITY TREATMENT   Patient Name: Alex Charles MRN: [id number] DOB:07-11-1965, 58 y.o., male Today's Date: 04/06/2024  END OF SESSION:  PT End of Session - 04/06/24 0942     Visit Number 6    Number of Visits 7   with eval   Date for Recertification  05/06/24   to allow for scheduling delays   Authorization Type [employer]    Authorization Time Period 7 visits 8/22-11/5    Authorization - Number of Visits 7    PT Start Time 0940   pt arrived late   PT Stop Time 1024    PT Time Calculation (min) 44 min    Equipment Utilized During Treatment Gait belt    Activity Tolerance Patient tolerated treatment well    Behavior During Therapy WFL for tasks assessed/performed               Past Medical History:  Diagnosis Date   Alcoholism in recovery (HCC)    happened 4 years ago/ went thru detox in the hospital   Allergy    seasonal   Anxiety    Blockage of coronary artery of heart (HCC)    1 stent put in/ in 2015   CHF (congestive heart failure) (HCC)    Depression    Hyperlipidemia    Prediabetes    Past Surgical History:  Procedure Laterality Date   ABDOMINAL AORTOGRAM W/LOWER EXTREMITY N/A 03/02/2023   Procedure: ABDOMINAL AORTOGRAM W/LOWER EXTREMITY;  Surgeon: Court Dorn PARAS, MD;  Location: [facility];  Service: Cardiovascular;  Laterality: N/A;   ABDOMINAL AORTOGRAM W/LOWER EXTREMITY N/A 04/16/2023   Procedure: ABDOMINAL AORTOGRAM W/LOWER EXTREMITY;  Surgeon: Court Dorn PARAS, MD;  Location: [facility];  Service: Cardiovascular;  Laterality: N/A;   CORONARY ANGIOPLASTY WITH STENT PLACEMENT  2015   one stent   OTHER SURGICAL HISTORY     PERIPHERAL VASCULAR ATHERECTOMY  04/16/2023   Procedure: PERIPHERAL VASCULAR ATHERECTOMY;  Surgeon: Court Dorn PARAS, MD;  Location: [facility];  Service: Cardiovascular;;   tooth extracted     Patient Active Problem List   Diagnosis Date Noted   Claudication in  peripheral vascular disease 04/16/2023   Mediastinal mass 04/09/2022   Anxiety and depression 03/06/2022   Peripheral arterial disease 01/07/2020   Neuropathy 04/09/2017   Depression 11/09/2015   Generalized anxiety disorder 11/09/2015   Tobacco dependence 11/09/2015   Chronic pain of right hip 07/04/2015   Acute right hip pain 05/30/2015   Sciatica neuralgia 05/30/2015   Hyperlipidemia 04/02/2015   Fatty liver 04/02/2015   Alcoholism in remission (HCC) 04/02/2015   Prediabetes 04/02/2015   Chronic systolic heart failure (HCC) 04/02/2015   Hip pain, chronic 04/02/2015   Elevated lipase 04/02/2015    PCP: Oley Bascom RAMAN, NP  REFERRING PROVIDER: Oley Bascom RAMAN, NP  REFERRING DIAG: [phone] (ICD-10-CM) - Right hip pain  THERAPY DIAG:  Muscle weakness (generalized)  Other abnormalities of gait and mobility  Pain in right hip  Rationale for Evaluation and Treatment: Rehabilitation  ONSET DATE: 01/20/2024  SUBJECTIVE:   SUBJECTIVE STATEMENT:  Pt reports that his pain is not doing bad today, still gets tweaks occasionally but overall not bad. Pt reports that his exercises remain a good challenge. Pt reports that work and walks are good in regards to pain.   From initial eval: Pt reports that his R hip joint feels out of joint or out of whack. He denies any specific injury  that he can recall that led to this pain, has been going on for about 2 months. He spoke with his PCP about it last month, had an xray performed which showed no fractures. He reports that walking is a struggle but he also has arthritis. He enjoys walking with a group on Sundays and this has been limited by his pain. He can make it through the walks but it is tough. Additionally, if he lifts or turns his leg a certain way he will feel a sharp pain or a pinch, has trouble lifting his leg to roll over in bed and has difficulty lifting his leg (marching it). He is on his feet all day at work and has a  general dull ache. During evaluation while leaning to his L side seated in a chair to get phone out of his R pocket he has onset of pain.   PERTINENT HISTORY: PMH: prediabetes, GAD, depression, PAD, PVD, neuropathy, HLD  PAIN:  Are you having pain? Yes: NPRS scale: 3/10 at rest (annoying), 7/10 at the worst Pain location: band at base of back comes around side of R hip Pain description: sharp Aggravating factors: trying to turn leg to the side or tries to lift leg up (march) Relieving factors: changing out of painful position  PRECAUTIONS: None  RED FLAGS: None   WEIGHT BEARING RESTRICTIONS: No  FALLS:  Has patient fallen in last 6 months? 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  DIAGNOSTIC FINDINGS:   R Hip Xray 01/20/2024 FINDINGS: There is no evidence of hip fracture or dislocation of the right hip. No acute displaced fracture or dislocation of left hip on frontal view no acute displaced fracture or diastasis of the bones of the pelvis. There is no evidence of arthropathy or other focal bone abnormality. Vascular calcifications.   IMPRESSION: Negative for acute traumatic injury.  PATIENT SURVEYS:  None administered  COGNITION: Overall cognitive status: Within functional limits for tasks assessed     SENSATION: No N/T in legs Slightly decreased sensation in R lateral knee  EDEMA:  No swelling  MUSCLE LENGTH: Hamstrings: tight bilaterally  POSTURE: rounded shoulders and forward  head  PALPATION: Trigger points along R piriformis and glutes, no real TTP however  LUMBAR ROM:   Active  A/PROM  eval  Flexion Limited by tight HS  Extension WFL  Right lateral flexion WFL  Left lateral flexion WFL  Right rotation WFL, slight pain on R side  Left rotation WFL   (Blank rows = not tested)   LOWER EXTREMITY ROM:  Active ROM Right eval Left eval  Hip flexion    Hip extension    Hip abduction    Hip adduction    Hip internal rotation    Hip external rotation    Knee flexion    Knee extension Tight HS Tight HS  Ankle dorsiflexion    Ankle plantarflexion    Ankle inversion    Ankle eversion     (Blank rows = not tested)  LOWER EXTREMITY MMT:  MMT Right eval  Left eval  Hip flexion 3- 5  Hip extension    Hip abduction 2- 4+  Hip adduction    Hip internal rotation    Hip external rotation    Knee flexion 5 5  Knee extension 5 5  Ankle dorsiflexion 5 5  Ankle plantarflexion    Ankle inversion    Ankle eversion     (Blank rows = not tested)  LOWER EXTREMITY SPECIAL TESTS:  Hip special tests: Belvie (FABER) test: positive , Trendelenburg test: positive , Piriformis test: positive , and FAdIR (+)   Assessment of R hip pain: Flexion overpressure, ROM WFL and no pain FAbER, pain in R piriformis FAdIR, pain in R piriformis Piriformis test, pain in R piriformis Hip joint compression: no pain Hip joint distraction: relief of symptoms  FUNCTIONAL TESTS:  None assessed at time of eval  GAIT: Distance walked: various clinic distances Assistive device utilized: None Level of assistance: Modified independence Comments: antalgic                                                                                                                                TREATMENT DATE:   TherAct Treadmill level 1.2 mph x 10 min for dynamic cardiovascular warmup. Pt with mild aches/pains during gait but no overt increase in his pain. No RPE given but pt states  he exerted some effort but not max effort. Gradual progression to 4% incline.   To work on B hip abd strengthening and SLS stability: Quadruped fire hydrants 3 x 10 ft B Added to HEP, see bolded below In // bars with no UE support: Static SLS on airex with contralateral LE on gumdrop performing ball toss 3 x 15 reps B More difficulty balancing on RLE as compared to LLE    PATIENT EDUCATION:  Education details: continue HEP and added to HEP Person educated: Patient Education method: Explanation, Demonstration, Tactile cues, and Verbal cues Education comprehension: verbalized understanding, returned demonstration, and needs further education  HOME EXERCISE PROGRAM: Access Code: [email or web address] URL: [email or web address] Date: 02/12/2024 Prepared by: Waddell Southgate  Exercises - Supine Piriformis Stretch with Leg Straight  - 1 x daily - 7 x weekly - 1 sets - 3-5 reps - 30 sec hold - Seated Figure 4 Piriformis Stretch  - 1 x daily - 7 x weekly - 1 sets - 3-5 reps - 30 sec hold - Long Sitting Hamstring Stretch  - 1 x daily - 7 x weekly - 1 sets - 3-5 reps - 30 sec hold - Supine Butterfly Groin Stretch  - 1 x daily - 7 x weekly - 1 sets - 3-5 reps - 30 sec hold - Clamshell  - 1 x daily - 7 x weekly - 3 sets - 10 reps - Supine Bridge  - 1 x daily - 7 x weekly - 3 sets - 10 reps - 5 sec hold  Verbally added trigger point release with tennis ball  -  Side Stepping with Resistance at Feet  - 1 x daily - 7 x weekly - 3 sets - 10 reps - Standing Lateral Step-Down Heel Tap  - 1 x daily - 7 x weekly - 3 sets - 10 reps - Sideways Step Touch  - 1 x daily - 7 x weekly - 3 sets - 10 reps - Forward Monster Walks  - 1 x daily - 7 x weekly - 3 sets - 10 reps - Quadruped Academic librarian  - 1 x daily - 7 x weekly - 3 sets - 10 reps  ASSESSMENT:  CLINICAL IMPRESSION: Emphasis of skilled PT session on continuing to work on R hip strengthening as well as functional BLE strengthening and SLS  stability. He is challenged by exercises this visit but no significant increase in pain with exercise, just feels muscle soreness. He continues to benefit from skilled PT services to work towards increased independence with management of symptoms and ability to return to community level fitness without increase in pain. Continue POC.    OBJECTIVE IMPAIRMENTS: Abnormal gait, decreased activity tolerance, decreased knowledge of condition, difficulty walking, decreased ROM, decreased strength, impaired perceived functional ability, postural dysfunction, and pain.   ACTIVITY LIMITATIONS: transfers and bed mobility  PARTICIPATION LIMITATIONS: community activity  PERSONAL FACTORS: 3+ comorbidities:  prediabetes, GAD, depression, PAD, PVD, neuropathy, HLD are also affecting patient's functional outcome.   REHAB POTENTIAL: Excellent  CLINICAL DECISION MAKING: Stable/uncomplicated  EVALUATION COMPLEXITY: Low   GOALS: Goals reviewed with patient? Yes  SHORT TERM GOALS: Target date: 03/18/2024   Pt will be independent with initial HEP for improved strength, ROM, and management of pain symptoms. Baseline: Goal status: MET   LONG TERM GOALS: Target date: 04/22/2024   Pt will be independent with final HEP for improved strength, ROM, and management of pain symptoms. Baseline:  Goal status: INITIAL  2.  Pt will increase his muscle strength by 1 grade for improved function Baseline: see eval Goal status: INITIAL  3.  Pt will report </= 5/10 pain at the most and 1/10 pain at rest for improved function Baseline: 7/10 at the most, 3/10 currently (8/22) Goal status: INITIAL  4.  Pt will report being able to walk x 30 min with pain </= 3/10 Baseline: 7/10 (8/22) Goal status: INITIAL     PLAN:  PT FREQUENCY: 1x/week  PT DURATION: 6 sessions (POC extended due to delayed start of scheduling 1st treatment)  PLANNED INTERVENTIONS: [id number]- PT Re-evaluation, 97750- Physical Performance  Testing, 97110-Therapeutic exercises, 97530- Therapeutic activity, [id number]- Neuromuscular re-education, 97535- Self Care, [id number]- Manual therapy, [id number]- Gait training, [phone]- Orthotic Initial, [id number]- Orthotic/Prosthetic subsequent, [id number]- Aquatic Therapy, [phone]- Electrical stimulation (manual), [phone] (1-2 muscles), [id number] (3+ muscles)- Dry Needling, Patient/Family education, Balance training, Stair training, Taping, Joint mobilization, Spinal mobilization, DME instructions, Cryotherapy, and Moist heat  PLAN FOR NEXT SESSION: how is HEP? How are walks going? Twinges in R hip? foam rolling (figure 4), glute strength (SL bridge, squats), HS stretches, seated cross over glute stretch, SLS on compliant surface, standing ball kicks and stops (from airex?), half kneel exercises? SL deadlift; treadmill + incline or increased speed, seated on swiss ball; recert to add more visits and submit for auth   Waddell Southgate, PT Waddell Southgate, PT, DPT, CSRS  04/06/2024, 10:24 AM

## 2024-04-13 ENCOUNTER — Ambulatory Visit: Admitting: Physical Therapy

## 2024-04-13 DIAGNOSIS — R2689 Other abnormalities of gait and mobility: Secondary | ICD-10-CM | POA: Diagnosis not present

## 2024-04-13 DIAGNOSIS — M25551 Pain in right hip: Secondary | ICD-10-CM | POA: Diagnosis not present

## 2024-04-13 DIAGNOSIS — M6281 Muscle weakness (generalized): Secondary | ICD-10-CM | POA: Diagnosis not present

## 2024-04-13 NOTE — Therapy (Signed)
 OUTPATIENT PHYSICAL THERAPY LOWER EXTREMITY TREATMENT - RECERT   Patient Name: Alex Charles MRN: [id number] DOB:1965-07-10, 58 y.o., male Today's Date: 04/13/2024  END OF SESSION:  PT End of Session - 04/13/24 0938     Visit Number 7    Number of Visits 9   recert   Date for Recertification  05/06/24   to allow for scheduling delays   Authorization Type [employer]    Authorization Time Period 7 visits 8/22-11/5    Authorization - Visit Number 7    Authorization - Number of Visits 7    PT Start Time [phone]   pt arrived late   PT Stop Time 1014    PT Time Calculation (min) 38 min    Equipment Utilized During Treatment Gait belt    Activity Tolerance Patient tolerated treatment well    Behavior During Therapy WFL for tasks assessed/performed                Past Medical History:  Diagnosis Date   Alcoholism in recovery (HCC)    happened 4 years ago/ went thru detox in the hospital   Allergy    seasonal   Anxiety    Blockage of coronary artery of heart (HCC)    1 stent put in/ in 2015   CHF (congestive heart failure) (HCC)    Depression    Hyperlipidemia    Prediabetes    Past Surgical History:  Procedure Laterality Date   ABDOMINAL AORTOGRAM W/LOWER EXTREMITY N/A 03/02/2023   Procedure: ABDOMINAL AORTOGRAM W/LOWER EXTREMITY;  Surgeon: Court Dorn PARAS, MD;  Location: [facility];  Service: Cardiovascular;  Laterality: N/A;   ABDOMINAL AORTOGRAM W/LOWER EXTREMITY N/A 04/16/2023   Procedure: ABDOMINAL AORTOGRAM W/LOWER EXTREMITY;  Surgeon: Court Dorn PARAS, MD;  Location: [facility];  Service: Cardiovascular;  Laterality: N/A;   CORONARY ANGIOPLASTY WITH STENT PLACEMENT  2015   one stent   OTHER SURGICAL HISTORY     PERIPHERAL VASCULAR ATHERECTOMY  04/16/2023   Procedure: PERIPHERAL VASCULAR ATHERECTOMY;  Surgeon: Court Dorn PARAS, MD;  Location: [facility];  Service: Cardiovascular;;   tooth extracted     Patient Active Problem List    Diagnosis Date Noted   Claudication in peripheral vascular disease 04/16/2023   Mediastinal mass 04/09/2022   Anxiety and depression 03/06/2022   Peripheral arterial disease 01/07/2020   Neuropathy 04/09/2017   Depression 11/09/2015   Generalized anxiety disorder 11/09/2015   Tobacco dependence 11/09/2015   Chronic pain of right hip 07/04/2015   Acute right hip pain 05/30/2015   Sciatica neuralgia 05/30/2015   Hyperlipidemia 04/02/2015   Fatty liver 04/02/2015   Alcoholism in remission (HCC) 04/02/2015   Prediabetes 04/02/2015   Chronic systolic heart failure (HCC) 04/02/2015   Hip pain, chronic 04/02/2015   Elevated lipase 04/02/2015    PCP: Oley Bascom RAMAN, NP  REFERRING PROVIDER: Oley Bascom RAMAN, NP  REFERRING DIAG: [phone] (ICD-10-CM) - Right hip pain  THERAPY DIAG:  Muscle weakness (generalized)  Other abnormalities of gait and mobility  Pain in right hip  Rationale for Evaluation and Treatment: Rehabilitation  ONSET DATE: 01/20/2024  SUBJECTIVE:   SUBJECTIVE STATEMENT:  Pt reports that things are feeling better, he can tell that he needs to put more effort into it in terms of stretching. He noticed that his hip was tight walking around work yesterday but did not have the twinging pain, he particularly notices this when walking.  Pt with no pain today; longest  walk he has done so far since starting PT is about 20 min and did have some aches and pains but once he gets through it kinda breaks through the pain.   From initial eval: Pt reports that his R hip joint feels out of joint or out of whack. He denies any specific injury that he can recall that led to this pain, has been going on for about 2 months. He spoke with his PCP about it last month, had an xray performed which showed no fractures. He reports that walking is a struggle but he also has arthritis. He enjoys walking with a group on Sundays and this has been limited by his pain. He can make it  through the walks but it is tough. Additionally, if he lifts or turns his leg a certain way he will feel a sharp pain or a pinch, has trouble lifting his leg to roll over in bed and has difficulty lifting his leg (marching it). He is on his feet all day at work and has a general dull ache. During evaluation while leaning to his L side seated in a chair to get phone out of his R pocket he has onset of pain.   PERTINENT HISTORY: PMH: prediabetes, GAD, depression, PAD, PVD, neuropathy, HLD  PAIN:  Are you having pain? No  PRECAUTIONS: None  RED FLAGS: None   WEIGHT BEARING RESTRICTIONS: No  FALLS:  Has patient fallen in last 6 months? 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  DIAGNOSTIC FINDINGS:   R Hip Xray 01/20/2024 FINDINGS: There is no evidence of hip fracture or dislocation of the right hip. No acute displaced fracture or dislocation of left hip on frontal view no acute displaced fracture or diastasis of the bones of the pelvis. There is no evidence of arthropathy or other focal bone abnormality. Vascular calcifications.   IMPRESSION: Negative for acute traumatic injury.  PATIENT SURVEYS:  None administered  COGNITION: Overall cognitive status: Within functional limits for tasks assessed     SENSATION: No N/T in legs Slightly decreased sensation in R lateral knee  EDEMA:  No swelling  MUSCLE LENGTH: Hamstrings: tight bilaterally  POSTURE: rounded shoulders and forward head  PALPATION: Trigger  points along R piriformis and glutes, no real TTP however  LUMBAR ROM:   Active  A/PROM  eval  Flexion Limited by tight HS  Extension WFL  Right lateral flexion WFL  Left lateral flexion WFL  Right rotation WFL, slight pain on R side  Left rotation WFL   (Blank rows = not tested)   LOWER EXTREMITY ROM:  Active ROM Right eval Left eval  Hip flexion    Hip extension    Hip abduction    Hip adduction    Hip internal rotation    Hip external rotation    Knee flexion    Knee extension Tight HS Tight HS  Ankle dorsiflexion    Ankle plantarflexion    Ankle inversion    Ankle eversion     (Blank rows = not tested)  LOWER EXTREMITY MMT:  MMT Right eval Left eval  Hip flexion 3- 5  Hip extension    Hip abduction 2- 4+  Hip adduction    Hip internal rotation    Hip external rotation    Knee flexion 5 5  Knee extension 5 5  Ankle dorsiflexion 5 5  Ankle plantarflexion    Ankle inversion    Ankle eversion     (Blank rows = not tested)  LOWER EXTREMITY SPECIAL TESTS:  Hip special tests: Belvie (FABER) test: positive , Trendelenburg test: positive , Piriformis test: positive , and FAdIR (+)   Assessment of R hip pain: Flexion overpressure, ROM WFL and no pain FAbER, pain in R piriformis FAdIR, pain in R piriformis Piriformis test, pain in R piriformis Hip joint compression: no pain Hip joint distraction: relief of symptoms  FUNCTIONAL TESTS:  None assessed at time of eval  GAIT: Distance walked: various clinic distances Assistive device utilized: None Level of assistance: Modified independence Comments: antalgic                                                                                                                                TREATMENT DATE:   TherAct Treadmill level 1.4 mph x 10 min for dynamic cardiovascular warmup. Pt with mild aches/pains during gait but no overt increase in his pain. Gradual progression to 5% incline (increased by 1%  every 2 min). RPE 5/10.  To work on SLS stability and hip strengthening: Standing on airex performing alt L/R gumdrop taps with progression to cone taps with red TB around ankles More challenged by cone taps as compared to gumdrop taps   For goal reassessment: LOWER EXTREMITY MMT:  MMT Right eval Left eval Right 04/13/24 Left 04/13/24  Hip flexion 3- 5 5 5   Hip extension      Hip abduction 2- 4+ 4 5  Hip adduction      Hip internal rotation      Hip external rotation      Knee flexion 5 5    Knee extension 5 5    Ankle dorsiflexion 5 5    Ankle plantarflexion      Ankle inversion      Ankle eversion       (Blank rows = not tested)   Assessment of R hip pain: Flexion overpressure, ROM WFL and no pain FAbER, no pain FAdIR, no pain Piriformis test, no pain Hip joint compression: no pain Hip joint distraction: relief of symptoms    PATIENT EDUCATION:  Education details: continue HEP, results of assessments as compared to initial eval Person educated: Patient Education method: Explanation, Demonstration, Tactile cues, and Verbal cues Education comprehension: verbalized understanding, returned demonstration, and needs further education  HOME EXERCISE PROGRAM: Access Code: [email or web address] URL: [email or web address] Date: 02/12/2024 Prepared by: Waddell Southgate  Exercises - Supine Piriformis Stretch with Leg Straight  - 1 x daily - 7 x weekly - 1  sets - 3-5 reps - 30 sec hold - Seated Figure 4 Piriformis Stretch  - 1 x daily - 7 x weekly - 1 sets - 3-5 reps - 30 sec hold - Long Sitting Hamstring Stretch  - 1 x daily - 7 x weekly - 1 sets - 3-5 reps - 30 sec hold - Supine Butterfly Groin Stretch  - 1 x daily - 7 x weekly - 1 sets - 3-5 reps - 30 sec hold - Clamshell  - 1 x daily - 7 x weekly - 3 sets - 10 reps - Supine Bridge  - 1 x daily - 7 x weekly - 3 sets - 10 reps - 5 sec hold  Verbally added trigger point release with tennis ball  - Side Stepping with  Resistance at Feet  - 1 x daily - 7 x weekly - 3 sets - 10 reps - Standing Lateral Step-Down Heel Tap  - 1 x daily - 7 x weekly - 3 sets - 10 reps - Sideways Step Touch  - 1 x daily - 7 x weekly - 3 sets - 10 reps - Forward Monster Walks  - 1 x daily - 7 x weekly - 3 sets - 10 reps - Quadruped Academic librarian  - 1 x daily - 7 x weekly - 3 sets - 10 reps  ASSESSMENT:  CLINICAL IMPRESSION: Emphasis of skilled PT session on assessing LTG for recertification of PT services as well as continuing to work on increasing challenge of gait on treadmill and continuing to work on SLS stability and B hip strengthening. Pt has met 2/4 LTG due to improving his R hip strength and reporting decreased pain. He is making progress towards remaining 2/2 LTG as we work towards finalizing his HEP and as he works towards being able to walk x 30 min with minimal increase in pain. He continues to exhibit ongoing mild weakness in his R hip as compared to his L hip though it is significantly improved from initial eval. His pain has overall improved with no pain experienced this session. He is working up to being able to walk 30 min, can currently walk up to 20 min and does have to push through the pain. He continues to benefit from skilled PT services to work towards increased independence with management of symptoms and ability to return to community level fitness without increase in pain. Continue POC.    OBJECTIVE IMPAIRMENTS: Abnormal gait, decreased activity tolerance, decreased knowledge of condition, difficulty walking, decreased ROM, decreased strength, impaired perceived functional ability, postural dysfunction, and pain.   ACTIVITY LIMITATIONS: transfers and bed mobility  PARTICIPATION LIMITATIONS: community activity  PERSONAL FACTORS: 3+ comorbidities:  prediabetes, GAD, depression, PAD, PVD, neuropathy, HLD are also affecting patient's functional outcome.   REHAB POTENTIAL: Excellent  CLINICAL DECISION MAKING:  Stable/uncomplicated  EVALUATION COMPLEXITY: Low   GOALS: Goals reviewed with patient? Yes  SHORT TERM GOALS: Target date: 03/18/2024   Pt will be independent with initial HEP for improved strength, ROM, and management of pain symptoms. Baseline: Goal status: MET   LONG TERM GOALS: Target date: 04/22/2024   Pt will be independent with final HEP for improved strength, ROM, and management of pain symptoms. Baseline: independent with current HEP - HEP not yet finalized (10/22) Goal status: IN PROGRESS  2.  Pt will increase his muscle strength by 1 grade for improved function Baseline: see eval  LOWER EXTREMITY MMT:  MMT Right eval Left eval Right 04/13/24 Left 04/13/24  Hip flexion 3- 5 5 5   Hip abduction 2- 4+ 4 5    Goal status: MET  3.  Pt will report </= 5/10 pain at the most and 1/10 pain at rest for improved function Baseline: 7/10 at the most, 3/10 currently (8/22), 0/10 (10/22) Goal status: MET  4.  Pt will report being able to walk x 30 min with pain </= 3/10 Baseline: 7/10 (8/22), 20 min (10/22) Goal status: IN PROGRESS  NEW SHORT TERM GOALS=NEW LONG TERM GOALS due to length of POC   NEW LONG TERM GOALS:  Target date: 05/11/2024   Pt will be independent with final HEP for improved strength, ROM, and management of pain symptoms. Baseline: independent with current HEP - HEP not yet finalized (10/22) Goal status: IN PROGRESS  2.  Pt will report being able to walk x 30 min with pain </= 3/10 Baseline: 7/10 (8/22), 20 min (10/22) Goal status: IN PROGRESS      PLAN:  PT FREQUENCY: 1x/week  PT DURATION: 6 sessions + 2 sessions  PLANNED INTERVENTIONS: [id number]- PT Re-evaluation, 97750- Physical Performance Testing, 97110-Therapeutic exercises, 97530- Therapeutic activity, [id number]- Neuromuscular re-education, 97535- Self Care, [id number]- Manual therapy, [id number]- Gait training, [phone]- Orthotic Initial, [id number]- Orthotic/Prosthetic subsequent, [id number]- Aquatic  Therapy, [phone]- Electrical stimulation (manual), [phone] (1-2 muscles), [id number] (3+ muscles)- Dry Needling, Patient/Family education, Balance training, Stair training, Taping, Joint mobilization, Spinal mobilization, DME instructions, Cryotherapy, and Moist heat  PLAN FOR NEXT SESSION: how is HEP? How are walks going? Twinges in R hip? foam rolling (figure 4), glute strength (SL bridge, squats), HS stretches, seated cross over glute stretch, SLS on compliant surface, standing ball kicks and stops (from airex?), half kneel exercises? SL deadlift; treadmill + incline or increased speed, seated on swiss ball; did we get auth back? Resisted cone taps on airex again   Waddell Southgate, PT Waddell Southgate, PT, DPT, CSRS  04/13/2024, 10:14 AM

## 2024-04-20 ENCOUNTER — Ambulatory Visit: Admitting: Physical Therapy

## 2024-04-20 DIAGNOSIS — M6281 Muscle weakness (generalized): Secondary | ICD-10-CM

## 2024-04-20 DIAGNOSIS — M25551 Pain in right hip: Secondary | ICD-10-CM

## 2024-04-20 DIAGNOSIS — R2689 Other abnormalities of gait and mobility: Secondary | ICD-10-CM | POA: Diagnosis not present

## 2024-04-20 NOTE — Therapy (Signed)
 OUTPATIENT PHYSICAL THERAPY LOWER EXTREMITY TREATMENT   Patient Name: Alex Charles MRN: [id number] DOB:15-Apr-1966, 58 y.o., male Today's Date: 04/20/2024  END OF SESSION:  PT End of Session - 04/20/24 0936     Visit Number 8    Number of Visits 9   recert   Date for Recertification  05/06/24   to allow for scheduling delays   Authorization Type [employer]    Authorization Time Period 7 visits 8/22-11/5    Authorization - Number of Visits 7    PT Start Time [phone]   pt arrived late   PT Stop Time 1014    PT Time Calculation (min) 40 min    Equipment Utilized During Treatment Gait belt    Activity Tolerance Patient tolerated treatment well    Behavior During Therapy WFL for tasks assessed/performed                 Past Medical History:  Diagnosis Date   Alcoholism in recovery (HCC)    happened 4 years ago/ went thru detox in the hospital   Allergy    seasonal   Anxiety    Blockage of coronary artery of heart (HCC)    1 stent put in/ in 2015   CHF (congestive heart failure) (HCC)    Depression    Hyperlipidemia    Prediabetes    Past Surgical History:  Procedure Laterality Date   ABDOMINAL AORTOGRAM W/LOWER EXTREMITY N/A 03/02/2023   Procedure: ABDOMINAL AORTOGRAM W/LOWER EXTREMITY;  Surgeon: Court Dorn PARAS, MD;  Location: [facility];  Service: Cardiovascular;  Laterality: N/A;   ABDOMINAL AORTOGRAM W/LOWER EXTREMITY N/A 04/16/2023   Procedure: ABDOMINAL AORTOGRAM W/LOWER EXTREMITY;  Surgeon: Court Dorn PARAS, MD;  Location: [facility];  Service: Cardiovascular;  Laterality: N/A;   CORONARY ANGIOPLASTY WITH STENT PLACEMENT  2015   one stent   OTHER SURGICAL HISTORY     PERIPHERAL VASCULAR ATHERECTOMY  04/16/2023   Procedure: PERIPHERAL VASCULAR ATHERECTOMY;  Surgeon: Court Dorn PARAS, MD;  Location: [facility];  Service: Cardiovascular;;   tooth extracted     Patient Active Problem List   Diagnosis Date Noted   Claudication in  peripheral vascular disease 04/16/2023   Mediastinal mass 04/09/2022   Anxiety and depression 03/06/2022   Peripheral arterial disease 01/07/2020   Neuropathy 04/09/2017   Depression 11/09/2015   Generalized anxiety disorder 11/09/2015   Tobacco dependence 11/09/2015   Chronic pain of right hip 07/04/2015   Acute right hip pain 05/30/2015   Sciatica neuralgia 05/30/2015   Hyperlipidemia 04/02/2015   Fatty liver 04/02/2015   Alcoholism in remission (HCC) 04/02/2015   Prediabetes 04/02/2015   Chronic systolic heart failure (HCC) 04/02/2015   Hip pain, chronic 04/02/2015   Elevated lipase 04/02/2015    PCP: Oley Bascom RAMAN, NP  REFERRING PROVIDER: Oley Bascom RAMAN, NP  REFERRING DIAG: [id number] (ICD-10-CM) - Right hip pain  THERAPY DIAG:  Muscle weakness (generalized)  Other abnormalities of gait and mobility  Pain in right hip  Rationale for Evaluation and Treatment: Rehabilitation  ONSET DATE: 01/20/2024  SUBJECTIVE:   SUBJECTIVE STATEMENT:  Pt reports that things are feeling good today, he felt really good after last visit.  On Sunday he went to [facility] and spent all day walking (2 mile corn maze) no issues, no fatigue, walking on uneven ground, etc.   From initial eval: Pt reports that his R hip joint feels out of joint or out of whack. He denies  any specific injury that he can recall that led to this pain, has been going on for about 2 months. He spoke with his PCP about it last month, had an xray performed which showed no fractures. He reports that walking is a struggle but he also has arthritis. He enjoys walking with a group on Sundays and this has been limited by his pain. He can make it through the walks but it is tough. Additionally, if he lifts or turns his leg a certain way he will feel a sharp pain or a pinch, has trouble lifting his leg to roll over in bed and has difficulty lifting his leg (marching it). He is on his feet all day at work and has a  general dull ache. During evaluation while leaning to his L side seated in a chair to get phone out of his R pocket he has onset of pain.   PERTINENT HISTORY: PMH: prediabetes, GAD, depression, PAD, PVD, neuropathy, HLD  PAIN:  Are you having pain? No  PRECAUTIONS: None  RED FLAGS: None   WEIGHT BEARING RESTRICTIONS: No  FALLS:  Has patient fallen in last 6 months? 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  DIAGNOSTIC FINDINGS:   R Hip Xray 01/20/2024 FINDINGS: There is no evidence of hip fracture or dislocation of the right hip. No acute displaced fracture or dislocation of left hip on frontal view no acute displaced fracture or diastasis of the bones of the pelvis. There is no evidence of arthropathy or other focal bone abnormality. Vascular calcifications.   IMPRESSION: Negative for acute traumatic injury.  PATIENT SURVEYS:  None administered  COGNITION: Overall cognitive status: Within functional limits for tasks assessed     SENSATION: No N/T in legs Slightly decreased sensation in R lateral knee  EDEMA:  No swelling  MUSCLE LENGTH: Hamstrings: tight bilaterally  POSTURE: rounded shoulders and forward head  PALPATION: Trigger points along R piriformis and glutes, no real TTP however  LUMBAR ROM:   Active  A/PROM  eval  Flexion Limited by tight HS  Extension WFL  Right lateral flexion WFL  Left lateral flexion WFL  Right rotation WFL, slight pain on R side  Left rotation WFL   (Blank  rows = not tested)   LOWER EXTREMITY ROM:  Active ROM Right eval Left eval  Hip flexion    Hip extension    Hip abduction    Hip adduction    Hip internal rotation    Hip external rotation    Knee flexion    Knee extension Tight HS Tight HS  Ankle dorsiflexion    Ankle plantarflexion    Ankle inversion    Ankle eversion     (Blank rows = not tested)  LOWER EXTREMITY MMT:  MMT Right eval Left eval  Hip flexion 3- 5  Hip extension    Hip abduction 2- 4+  Hip adduction    Hip internal rotation    Hip external rotation    Knee flexion 5 5  Knee extension 5 5  Ankle  dorsiflexion 5 5  Ankle plantarflexion    Ankle inversion    Ankle eversion     (Blank rows = not tested)  LOWER EXTREMITY SPECIAL TESTS:  Hip special tests: Belvie (FABER) test: positive , Trendelenburg test: positive , Piriformis test: positive , and FAdIR (+)   Assessment of R hip pain: Flexion overpressure, ROM WFL and no pain FAbER, pain in R piriformis FAdIR, pain in R piriformis Piriformis test, pain in R piriformis Hip joint compression: no pain Hip joint distraction: relief of symptoms  FUNCTIONAL TESTS:  None assessed at time of eval  GAIT: Distance walked: various clinic distances Assistive device utilized: None Level of assistance: Modified independence Comments: antalgic                                                                                                                                TREATMENT DATE:   TherAct Treadmill level 1.5 mph x 10 min for dynamic cardiovascular warmup. Gradual progression to 4% incline (increased by 1% every 2 min). RPE 4/10.  To work on SLS stability and hip strengthening: Standing on solid ground with progression to stance on airex performing alt L/R cone taps with red TB around ankles More challenged by airex vs stance on solid ground 3 x 10 reps B Alt L/R ball kicks 3 x 10 reps Added in 5# ankle weights B for last 2  sets     PATIENT EDUCATION:  Education details: continue HEP, plan to d/c next visit Education method: Explanation, Demonstration, Tactile cues, and Verbal cues Education comprehension: verbalized understanding, returned demonstration, and needs further education  HOME EXERCISE PROGRAM: Access Code: [email or web address] URL: [email or web address] Date: 02/12/2024 Prepared by: Waddell Southgate  Exercises - Supine Piriformis Stretch with Leg Straight  - 1 x daily - 7 x weekly - 1 sets - 3-5 reps - 30 sec hold - Seated Figure 4 Piriformis Stretch  - 1 x daily - 7 x weekly - 1 sets - 3-5 reps - 30 sec hold - Long Sitting Hamstring Stretch  - 1 x daily - 7 x weekly - 1 sets - 3-5 reps - 30 sec hold - Supine Butterfly Groin Stretch  - 1 x daily - 7 x weekly - 1 sets - 3-5 reps - 30 sec hold - Clamshell  - 1 x daily - 7 x weekly - 3 sets - 10 reps - Supine Bridge  - 1 x daily - 7 x weekly - 3 sets - 10 reps - 5 sec hold  Verbally added trigger point release with tennis ball  - Side Stepping with Resistance at Feet  - 1 x daily - 7 x weekly - 3 sets - 10 reps - Standing Lateral Step-Down Heel Tap  - 1 x daily - 7 x weekly - 3 sets - 10 reps - Sideways Step Touch  - 1 x daily - 7 x weekly - 3 sets - 10 reps -  Forward Monster Walks  - 1 x daily - 7 x weekly - 3 sets - 10 reps - Quadruped Academic Librarian  - 1 x daily - 7 x weekly - 3 sets - 10 reps  ASSESSMENT:  CLINICAL IMPRESSION: Emphasis of skilled PT session on continuing to work on dynamic endurance training on treadmill, SLS stability/balance, and functional LE strengthening. He is challenged by stance on airex and SLS tasks this date. He continues to benefit from skilled PT services to work towards increased independence with management of symptoms and ability to return to community level fitness without increase in pain. Plan to assess LTG and d/c from PT next visit. Continue POC.    OBJECTIVE IMPAIRMENTS: Abnormal gait, decreased  activity tolerance, decreased knowledge of condition, difficulty walking, decreased ROM, decreased strength, impaired perceived functional ability, postural dysfunction, and pain.   ACTIVITY LIMITATIONS: transfers and bed mobility  PARTICIPATION LIMITATIONS: community activity  PERSONAL FACTORS: 3+ comorbidities:  prediabetes, GAD, depression, PAD, PVD, neuropathy, HLD are also affecting patient's functional outcome.   REHAB POTENTIAL: Excellent  CLINICAL DECISION MAKING: Stable/uncomplicated  EVALUATION COMPLEXITY: Low   GOALS: Goals reviewed with patient? Yes  SHORT TERM GOALS: Target date: 03/18/2024   Pt will be independent with initial HEP for improved strength, ROM, and management of pain symptoms. Baseline: Goal status: MET   LONG TERM GOALS: Target date: 04/22/2024   Pt will be independent with final HEP for improved strength, ROM, and management of pain symptoms. Baseline: independent with current HEP - HEP not yet finalized (10/22) Goal status: IN PROGRESS  2.  Pt will increase his muscle strength by 1 grade for improved function Baseline: see eval  LOWER EXTREMITY MMT:  MMT Right eval Left eval Right 04/13/24 Left 04/13/24  Hip flexion 3- 5 5 5   Hip abduction 2- 4+ 4 5    Goal status: MET  3.  Pt will report </= 5/10 pain at the most and 1/10 pain at rest for improved function Baseline: 7/10 at the most, 3/10 currently (8/22), 0/10 (10/22) Goal status: MET  4.  Pt will report being able to walk x 30 min with pain </= 3/10 Baseline: 7/10 (8/22), 20 min (10/22) Goal status: IN PROGRESS  NEW SHORT TERM GOALS=NEW LONG TERM GOALS due to length of POC   NEW LONG TERM GOALS:  Target date: 05/11/2024   Pt will be independent with final HEP for improved strength, ROM, and management of pain symptoms. Baseline: independent with current HEP - HEP not yet finalized (10/22) Goal status: IN PROGRESS  2.  Pt will report being able to walk x 30 min with  pain </= 3/10 Baseline: 7/10 (8/22), 20 min (10/22) Goal status: IN PROGRESS      PLAN:  PT FREQUENCY: 1x/week  PT DURATION: 6 sessions + 2 sessions  PLANNED INTERVENTIONS: [id number]- PT Re-evaluation, 97750- Physical Performance Testing, 97110-Therapeutic exercises, 97530- Therapeutic activity, [id number]- Neuromuscular re-education, 97535- Self Care, [id number]- Manual therapy, [id number]- Gait training, [phone]- Orthotic Initial, [id number]- Orthotic/Prosthetic subsequent, [id number]- Aquatic Therapy, [phone]- Electrical stimulation (manual), [phone] (1-2 muscles), [id number] (3+ muscles)- Dry Needling, Patient/Family education, Balance training, Stair training, Taping, Joint mobilization, Spinal mobilization, DME instructions, Cryotherapy, and Moist heat  PLAN FOR NEXT SESSION: did we get auth back? Assess LTG and d/c, any other balance exercises (SLS, resisted, compliant surfaces/obstacles)   Waddell Southgate, PT Waddell Southgate, PT, DPT, CSRS  04/20/2024, 10:14 AM

## 2024-04-27 ENCOUNTER — Ambulatory Visit: Attending: Nurse Practitioner | Admitting: Physical Therapy

## 2024-04-27 DIAGNOSIS — R2689 Other abnormalities of gait and mobility: Secondary | ICD-10-CM | POA: Diagnosis not present

## 2024-04-27 DIAGNOSIS — M6281 Muscle weakness (generalized): Secondary | ICD-10-CM | POA: Insufficient documentation

## 2024-04-27 DIAGNOSIS — M25551 Pain in right hip: Secondary | ICD-10-CM | POA: Insufficient documentation

## 2024-04-27 NOTE — Therapy (Signed)
 OUTPATIENT PHYSICAL THERAPY LOWER EXTREMITY TREATMENT - DISCHARGE NOTE   Patient Name: Alex Charles MRN: [id number] DOB:[birth year], 58 y.o., male Today's Date: 04/27/2024   PHYSICAL THERAPY DISCHARGE SUMMARY  Visits from Start of Care: 9  Current functional level related to goals / functional outcomes: Independent   Remaining deficits: Mild R hip twinge   Education / Equipment: Handout for final HEP   Patient agrees to discharge. Patient goals were met. Patient is being discharged due to meeting the stated rehab goals.   END OF SESSION:  PT End of Session - 04/27/24 0931     Visit Number 9    Number of Visits 9   recert   Date for Recertification  05/06/24   to allow for scheduling delays   Authorization Type [employer]    Authorization Time Period 7 visits 8/22-11/5    Authorization - Number of Visits 7    PT Start Time 0930    PT Stop Time 1010    PT Time Calculation (min) 40 min    Equipment Utilized During Treatment Gait belt    Activity Tolerance Patient tolerated treatment well    Behavior During Therapy WFL for tasks assessed/performed                  Past Medical History:  Diagnosis Date   Alcoholism in recovery (HCC)    happened 4 years ago/ went thru detox in the hospital   Allergy    seasonal   Anxiety    Blockage of coronary artery of heart (HCC)    1 stent put in/ in 2015   CHF (congestive heart failure) (HCC)    Depression    Hyperlipidemia    Prediabetes    Past Surgical History:  Procedure Laterality Date   ABDOMINAL AORTOGRAM W/LOWER EXTREMITY N/A 03/02/2023   Procedure: ABDOMINAL AORTOGRAM W/LOWER EXTREMITY;  Surgeon: Court Dorn PARAS, MD;  Location: [facility];  Service: Cardiovascular;  Laterality: N/A;   ABDOMINAL AORTOGRAM W/LOWER EXTREMITY N/A 04/16/2023   Procedure: ABDOMINAL AORTOGRAM W/LOWER EXTREMITY;  Surgeon: Court Dorn PARAS, MD;  Location: [facility];  Service: Cardiovascular;  Laterality: N/A;    CORONARY ANGIOPLASTY WITH STENT PLACEMENT  2015   one stent   OTHER SURGICAL HISTORY     PERIPHERAL VASCULAR ATHERECTOMY  04/16/2023   Procedure: PERIPHERAL VASCULAR ATHERECTOMY;  Surgeon: Court Dorn PARAS, MD;  Location: [facility];  Service: Cardiovascular;;   tooth extracted     Patient Active Problem List   Diagnosis Date Noted   Claudication in peripheral vascular disease 04/16/2023   Mediastinal mass 04/09/2022   Anxiety and depression 03/06/2022   Peripheral arterial disease 01/07/2020   Neuropathy 04/09/2017   Depression 11/09/2015   Generalized anxiety disorder 11/09/2015   Tobacco dependence 11/09/2015   Chronic pain of right hip 07/04/2015   Acute right hip pain 05/30/2015   Sciatica neuralgia 05/30/2015   Hyperlipidemia 04/02/2015   Fatty liver 04/02/2015   Alcoholism in remission (HCC) 04/02/2015   Prediabetes 04/02/2015   Chronic systolic heart failure (HCC) 04/02/2015   Hip pain, chronic 04/02/2015   Elevated lipase 04/02/2015    PCP: Oley Bascom RAMAN, NP  REFERRING PROVIDER: Oley Bascom RAMAN, NP  REFERRING DIAG: [id number] (ICD-10-CM) - Right hip pain  THERAPY DIAG:  Muscle weakness (generalized)  Other abnormalities of gait and mobility  Pain in right hip  Rationale for Evaluation and Treatment: Rehabilitation  ONSET DATE: 01/20/2024  SUBJECTIVE:   SUBJECTIVE STATEMENT:  Pt did move in a way in the past week that flared up his pain/tweaked it but he felt more limber and like he know what to do to treat it vs before he started therapy. He was working on setting up a Christmas display at work and had to catch a tree from falling over but with the exercises and skills he has learned in PT he felt like he had better balance. 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   From initial eval: Pt reports that his R hip joint feels out of joint or out of whack. He denies any specific injury that he can recall  that led to this pain, has been going on for about 2 months. He spoke with his PCP about it last month, had an xray performed which showed no fractures. He reports that walking is a struggle but he also has arthritis. He enjoys walking with a group on Sundays and this has been limited by his pain. He can make it through the walks but it is tough. Additionally, if he lifts or turns his leg a certain way he will feel a sharp pain or a pinch, has trouble lifting his leg to roll over in bed and has difficulty lifting his leg (marching it). He is on his feet all day at work and has a general dull ache. During evaluation while leaning to his L side seated in a chair to get phone out of his R pocket he has onset of pain.   PERTINENT HISTORY: PMH: prediabetes, GAD, depression, PAD, PVD, neuropathy, HLD  PAIN:  Are you having pain? No  PRECAUTIONS: None  RED FLAGS: None   WEIGHT BEARING RESTRICTIONS: No  FALLS:  Has patient fallen in last 6 months? 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  DIAGNOSTIC FINDINGS:   R Hip Xray 01/20/2024 FINDINGS: There is no evidence of hip fracture or dislocation of the right hip. No acute displaced fracture or dislocation of left hip on frontal view no acute displaced fracture or diastasis of the bones of the pelvis. There is no evidence of arthropathy or other focal bone abnormality. Vascular calcifications.   IMPRESSION: Negative for acute traumatic injury.  PATIENT SURVEYS:  None  administered  COGNITION: Overall cognitive status: Within functional limits for tasks assessed     SENSATION: No N/T in legs Slightly decreased sensation in R lateral knee  EDEMA:  No swelling  MUSCLE LENGTH: Hamstrings: tight bilaterally  POSTURE: rounded shoulders and forward head  PALPATION: Trigger points along R piriformis and glutes, no real TTP however  LUMBAR ROM:   Active  A/PROM  eval  Flexion Limited by tight HS  Extension WFL  Right lateral flexion WFL  Left lateral flexion WFL  Right rotation WFL, slight pain on R side  Left rotation WFL   (Blank rows = not tested)   LOWER EXTREMITY ROM:  Active ROM Right eval Left eval  Hip flexion    Hip extension    Hip abduction  Hip adduction    Hip internal rotation    Hip external rotation    Knee flexion    Knee extension Tight HS Tight HS  Ankle dorsiflexion    Ankle plantarflexion    Ankle inversion    Ankle eversion     (Blank rows = not tested)  LOWER EXTREMITY MMT:  MMT Right eval Left eval  Hip flexion 3- 5  Hip extension    Hip abduction 2- 4+  Hip adduction    Hip internal rotation    Hip external rotation    Knee flexion 5 5  Knee extension 5 5  Ankle dorsiflexion 5 5  Ankle plantarflexion    Ankle inversion    Ankle eversion     (Blank rows = not tested)  LOWER EXTREMITY SPECIAL TESTS:  Hip special tests: Belvie (FABER) test: positive , Trendelenburg test: positive , Piriformis test: positive , and FAdIR (+)   Assessment of R hip pain: Flexion overpressure, ROM WFL and no pain FAbER, pain in R piriformis FAdIR, pain in R piriformis Piriformis test, pain in R piriformis Hip joint compression: no pain Hip joint distraction: relief of symptoms  FUNCTIONAL TESTS:  None assessed at time of eval  GAIT: Distance walked: various clinic distances Assistive device utilized: None Level of assistance: Modified independence Comments: antalgic                                                                                                                                 TREATMENT DATE:   TherAct Treadmill level 1.6 mph x 10 min for dynamic cardiovascular warmup. Gradual progression to 5% incline (increased by 1% every 2 min). RPE 5/10.  To work on SLS stability, dynamic balance balance, and functional LE strengthening: Obstacle course: navigating over compliant surface with alt L/R gumdrop taps, standing marches on airex, stepping over 6 hurdles Added in 5# ankle weights B and obstacles under compliant surface    PATIENT EDUCATION:  Education details: continue HEP, plan to d/c this visit Education method: Explanation and Demonstration Education comprehension: verbalized understanding and returned demonstration  HOME EXERCISE PROGRAM: Access Code: [email or web address] URL: [email or web address] Date: 02/12/2024 Prepared by: Waddell Southgate  Exercises - Supine Piriformis Stretch with Leg Straight  - 1 x daily - 7 x weekly - 1 sets - 3-5 reps - 30 sec hold - Seated Figure 4 Piriformis Stretch  - 1 x daily - 7 x weekly - 1 sets - 3-5 reps - 30 sec hold - Long Sitting Hamstring Stretch  - 1 x daily - 7 x weekly - 1 sets - 3-5 reps - 30 sec hold - Supine Butterfly Groin Stretch  - 1 x daily - 7 x weekly - 1 sets - 3-5 reps - 30 sec hold - Clamshell  - 1 x daily - 7 x weekly - 3 sets - 10 reps - Supine Bridge  - 1 x daily - 7 x weekly -  3 sets - 10 reps - 5 sec hold  Verbally added trigger point release with tennis ball  - Side Stepping with Resistance at Feet  - 1 x daily - 7 x weekly - 3 sets - 10 reps - Standing Lateral Step-Down Heel Tap  - 1 x daily - 7 x weekly - 3 sets - 10 reps - Sideways Step Touch  - 1 x daily - 7 x weekly - 3 sets - 10 reps - Forward Monster Walks  - 1 x daily - 7 x weekly - 3 sets - 10 reps - Quadruped Academic Librarian  - 1 x daily - 7 x weekly - 3 sets - 10 reps  ASSESSMENT:  CLINICAL IMPRESSION: Emphasis of skilled PT  session on continuing to work on dynamic endurance training on treadmill, SLS stability/balance, and functional LE strengthening as well as assessing LTG for planned d/c from OPPT services this date. He has met 2/2 LTG due to being independent with his final HEP and being able to walk x 30 min with minimal increase in symptoms. He is agreeable to d/c and continue with his HEP.   OBJECTIVE IMPAIRMENTS: Abnormal gait, decreased activity tolerance, decreased knowledge of condition, difficulty walking, decreased ROM, decreased strength, impaired perceived functional ability, postural dysfunction, and pain.   ACTIVITY LIMITATIONS: transfers and bed mobility  PARTICIPATION LIMITATIONS: community activity  PERSONAL FACTORS: 3+ comorbidities:  prediabetes, GAD, depression, PAD, PVD, neuropathy, HLD are also affecting patient's functional outcome.   REHAB POTENTIAL: Excellent  CLINICAL DECISION MAKING: Stable/uncomplicated  EVALUATION COMPLEXITY: Low   GOALS: Goals reviewed with patient? Yes  SHORT TERM GOALS: Target date: 03/18/2024   Pt will be independent with initial HEP for improved strength, ROM, and management of pain symptoms. Baseline: Goal status: MET   LONG TERM GOALS: Target date: 04/22/2024   Pt will be independent with final HEP for improved strength, ROM, and management of pain symptoms. Baseline: independent with current HEP - HEP not yet finalized (10/22) Goal status: IN PROGRESS  2.  Pt will increase his muscle strength by 1 grade for improved function Baseline: see eval  LOWER EXTREMITY MMT:  MMT Right eval Left eval Right 04/13/24 Left 04/13/24  Hip flexion 3- 5 5 5   Hip abduction 2- 4+ 4 5    Goal status: MET  3.  Pt will report </= 5/10 pain at the most and 1/10 pain at rest for improved function Baseline: 7/10 at the most, 3/10 currently (8/22), 0/10 (10/22) Goal status: MET  4.  Pt will report being able to walk x 30 min with pain </=  3/10 Baseline: 7/10 (8/22), 20 min (10/22) Goal status: IN PROGRESS  NEW SHORT TERM GOALS=NEW LONG TERM GOALS due to length of POC   NEW LONG TERM GOALS:  Target date: 05/11/2024   Pt will be independent with final HEP for improved strength, ROM, and management of pain symptoms. Baseline: independent with current HEP - HEP not yet finalized (10/22) Goal status: MET  2.  Pt will report being able to walk x 30 min with pain </= 3/10 Baseline: 7/10 (8/22), 20 min (10/22), 30 min (11/5) Goal status: MET       PLAN: discharge from PT   Waddell Southgate, PT Waddell Southgate, PT, DPT, CSRS  04/27/2024, 10:16 AM

## 2024-04-30 ENCOUNTER — Other Ambulatory Visit (HOSPITAL_COMMUNITY): Payer: Self-pay

## 2024-04-30 DIAGNOSIS — F32A Depression, unspecified: Secondary | ICD-10-CM

## 2024-05-05 NOTE — Progress Notes (Signed)
 Psychiatric follow-up  Patient Identification: Alex Charles MRN:  [id number] Date of Evaluation:  05/11/2024 Referral Source: PCP Chief Complaint:   Chief Complaint  Patient presents with   Medication Refill   Follow-up   Depression   Anxiety   Visit Diagnosis:    ICD-10-CM   1. Anxiety and depression  F41.9 sertraline (ZOLOFT) 25 MG tablet   F32.A     2. Insomnia, unspecified type  G47.00 traZODone  (DESYREL ) 50 MG tablet        Assessment:  Alex Charles is a 58 y.o. male with a history of MDD, GAD who presents in person to [facility] for initial evaluation on 05/11/2024.    Today, his symptoms of depression and anxiety have improved significantly.  He is not actively or passively homicidal or suicidal, has been eating and sleeping well.  He has taken all his medication as prescribed and has had no severe side effects, had some mild diarrhea which he will continue to monitor for exacerbations.  Encouraged him to take his medications with food, patient amenable.  He has good coping skills and has remained sober but has recently started smoking electronic cigarettes however he is trying to restart Chantix  and quit smoking.  He does not use any other substances.  Psychosocial competent for his presentation transitioning from going to MDD versus despair have continued however he is more receptive and acceptable to it.  He is currently doing therapy has [facility] therapeutic [facility] and has also started group therapy which have positively affected his mood.  Due to significant benefit being on the current dose of medications, plan to continue the same medication management and have him follow-up in the clinic in 6 to 8 weeks.  Risk Assessment: A suicide and violence risk assessment was performed as part of this evaluation. There patient is deemed to be at chronic elevated risk for self-harm/suicide given the following factors: N/A. These risk factors are  mitigated by the following factors: lack of active SI/HI, no known access to weapons or firearms, no history of previous suicide attempts, and no history of violence. The patient is deemed to be at chronic elevated risk for violence given the following factors: N/A. These risk factors are mitigated by the following factors: no known history of violence towards others, no known violence towards others in the last 6 months, no known history of threats of harm towards others, no known homicidal ideation in the last 6 months, no command hallucinations to harm others in the last 6 months, no active symptoms of psychosis, and no active symptoms of mania. There is no acute risk for suicide or violence at this time. The patient was educated about relevant modifiable risk factors including following recommendations for treatment of psychiatric illness and abstaining from substance abuse.  While future psychiatric events cannot be accurately predicted, the patient does not currently require  acute inpatient psychiatric care and does not currently meet [place]  involuntary commitment criteria.  Patient was given contact information for crisis resources, behavioral health clinic and was instructed to call 911 for emergencies.    Plan: # MDD without psychotic features vs. Adjustment disorder with depressed mood Past medication trials: Buspirone  and hydroxyzine  and trazodone  Status of problem: New to the writer Interventions: -- Start Zoloft 25 mg daily -- Continue trazodone  50 mg nightly as needed for sleep, can take 25 mg as needed if causing excessive drowsiness  # GAD Past medication trials: Buspirone  and hydroxyzine  Status of problem: New  to the writer Interventions: -- Continue buspirone   History of Present Illness:   Alex Charles is a 58 year old male with a history of MDD, GAD that presented to the clinic today for follow-up. He reported his mood as pretty good .  He denied any active or passive  SI/HI/AVH.  Reported that the anxiety or paranoia around losing his job has resolved.  I have not felt like that .  Stated that his mood seems elevated I smile a lot more, have more sense of humor .  Reported that his sleep has been pretty good .  Stated that he takes half pill some days and sometimes full pill of trazodone  and he wakes up feeling refreshed.  When asked about appetite he stated I am more hungry .  Reported that his energy has improved.  Reported that he felt suicidal once over the past 1 month but not anymore, was not able to elicit any particular reason. Reported that he has been taking all his medications as prescribed, had a side effect of feeling queasy , having more diarrhea but denied any other side effects.  He denied any new physical concerns. Reported that he has been smoking 5 puffs of electronic cigarette each morning but stated I wanted to quit, I do not want to smoke, stated he started his Chantix  prescription back again.  He denied using any other substances, continues to maintain his 9 years of sobriety from using alcohol. Reported that he has been doing therapy and has started group sessions as well which has significantly affected his mood positively. We discussed about continuing the same medications, taking his medications with food and continue to monitor for any side effects.  We will have him back in the clinic in 6 to 8 weeks.   Associated Signs/Symptoms: Depression Symptoms:  depressed mood, insomnia, feelings of worthlessness/guilt, (Hypo) Manic Symptoms:  None Anxiety Symptoms:  Excessive Worry, Psychotic Symptoms:  None PTSD Symptoms: Negative  Past Psychiatric History:  Past psychiatric diagnoses: MDD, GAD Psychiatric hospitalizations:Denies Past suicide attempts: Denies Hx of self harm: Denies Hx of violence towards others: Denies Prior psychiatric providers: Denies Prior therapy: Denies Access to firearms: Denies  Prior medication  trials: Buspirone  30 mg, Trazodone  50 mg  Substance use: Denies  Past Medical History:  Past Medical History:  Diagnosis Date   Alcoholism in recovery (HCC)    happened 4 years ago/ went thru detox in the hospital   Allergy    seasonal   Anxiety    Blockage of coronary artery of heart (HCC)    1 stent put in/ in 2015   CHF (congestive heart failure) (HCC)    Depression    Hyperlipidemia    Prediabetes     Past Surgical History:  Procedure Laterality Date   ABDOMINAL AORTOGRAM W/LOWER EXTREMITY N/A 03/02/2023   Procedure: ABDOMINAL AORTOGRAM W/LOWER EXTREMITY;  Surgeon: Court Dorn PARAS, MD;  Location: [facility];  Service: Cardiovascular;  Laterality: N/A;   ABDOMINAL AORTOGRAM W/LOWER EXTREMITY N/A 04/16/2023   Procedure: ABDOMINAL AORTOGRAM W/LOWER EXTREMITY;  Surgeon: Court Dorn PARAS, MD;  Location: [facility];  Service: Cardiovascular;  Laterality: N/A;   CORONARY ANGIOPLASTY WITH STENT PLACEMENT  2015   one stent   OTHER SURGICAL HISTORY     PERIPHERAL VASCULAR ATHERECTOMY  04/16/2023   Procedure: PERIPHERAL VASCULAR ATHERECTOMY;  Surgeon: Court Dorn PARAS, MD;  Location: [facility];  Service: Cardiovascular;;   tooth extracted      Family  Psychiatric History: NS  Family History:  Family History  Problem Relation Age of Onset   Bladder Cancer Mother    Crohn's disease Mother    Skin cancer Mother    Hypertension Father    Irritable bowel syndrome Sister     Social History:   Social History   Socioeconomic History   Marital status: Single    Spouse name: Not on file   Number of children: Not on file   Years of education: Not on file   Highest education level: Not on file  Occupational History   Not on file  Tobacco Use   Smoking status: Former    Current packs/day: 0.00    Average packs/day: 0.2 packs/day for 0.1 years    Types: Cigarettes    Start date: 03/25/2023    Quit date: 05/04/2023    Years since quitting: 1.0    Smokeless tobacco: Never   Tobacco comments:    Patient completed health coaching for smoking cessation.  Vaping Use   Vaping status: Never Used  Substance and Sexual Activity   Alcohol use: No   Drug use: Never   Sexual activity: Not Currently  Other Topics Concern   Not on file  Social History Narrative   Not on file   Social Drivers of Health   Financial Resource Strain: Not on file  Food Insecurity: No Food Insecurity (07/29/2023)   Hunger Vital Sign    Worried About Running Out of Food in the Last Year: Never true    Ran Out of Food in the Last Year: Never true  Transportation Needs: No Transportation Needs (07/29/2023)   PRAPARE - Administrator, Civil Service (Medical): No    Lack of Transportation (Non-Medical): No  Physical Activity: Not on file  Stress: Not on file  Social Connections: Not on file    Additional Social History: 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  Allergies:  No Known Allergies  Metabolic Disorder Labs: Lab Results  Component Value Date   HGBA1C 6.1 (A) 01/20/2024   MPG 120 (H) 04/02/2015   No results found for: PROLACTIN Lab Results  Component Value Date   CHOL 113 01/20/2024   TRIG 93 01/20/2024   HDL 38 (L) 01/20/2024   CHOLHDL 3.0 01/20/2024   VLDL 25 04/17/2023   LDLCALC 57 01/20/2024   LDLCALC 26 04/17/2023   Lab Results  Component Value Date   TSH 1.880 01/04/2020    Therapeutic Level Labs: No results found for: LITHIUM No results found for: CBMZ No results found for: VALPROATE  Current Medications: Current Outpatient Medications  Medication Sig Dispense Refill   albuterol  (VENTOLIN  HFA) 108 (90 Base) MCG/ACT inhaler TAKE 2 PUFFS BY MOUTH EVERY 6 HOURS AS NEEDED FOR WHEEZE OR SHORTNESS OF BREATH 8.5 each 0   aspirin  EC 81 MG tablet Take 1 tablet (81 mg total) by mouth daily. 30 tablet 11   busPIRone  (BUSPAR ) 30 MG tablet TAKE 1 TABLET BY MOUTH TWICE A DAY 180 tablet 2    carboxymethylcellulose (REFRESH PLUS) 0.5 % SOLN 1 drop 3 (three) times daily as needed (dry/irritated eyes.).     cetirizine  (ZYRTEC ) 10 MG tablet TAKE 1 TABLET BY MOUTH EVERY DAY 90 tablet 3   cilostazol  (PLETAL ) 100 MG tablet Take 1 tablet (100 mg total) by mouth 2 (two) times daily. 180 tablet 3   clopidogrel  (PLAVIX ) 75 MG tablet TAKE 1 TABLET BY MOUTH DAILY WITH BREAKFAST 90 tablet 3  Cyanocobalamin (VITAMIN B-12 PO) Take 1 tablet by mouth daily as needed (energy).     diclofenac  (VOLTAREN ) 75 MG EC tablet TAKE 1 TABLET BY MOUTH 2 TIMES DAILY AS NEEDED. 180 tablet 1   diclofenac  Sodium (VOLTAREN ) 1 % GEL Apply 4 g topically 4 (four) times daily. Apply to affected areas 4 times daily as needed for pain. 100 g 2   ezetimibe  (ZETIA ) 10 MG tablet TAKE 1 TABLET BY MOUTH EVERY DAY APPOINTMENT FOR REFILLS 90 tablet 1   fenofibrate  160 MG tablet TAKE 1 TABLET BY MOUTH EVERY DAY 90 tablet 1   hydrOXYzine  (ATARAX ) 10 MG tablet TAKE 1 TABLET BY MOUTH THREE TIMES A DAY AS NEEDED 270 tablet 2   metoprolol  succinate (TOPROL -XL) 25 MG 24 hr tablet TAKE 1 TABLET (25 MG TOTAL) BY MOUTH DAILY. 90 tablet 2   rosuvastatin  (CRESTOR ) 40 MG tablet TAKE 1 TABLET BY MOUTH EVERY DAY 90 tablet 3   sertraline (ZOLOFT) 25 MG tablet Take 1 tablet (25 mg total) by mouth daily. 30 tablet 2   traZODone  (DESYREL ) 50 MG tablet Take 1 tablet (50 mg total) by mouth at bedtime as needed. for sleep 90 tablet 1   varenicline  (CHANTIX  CONTINUING MONTH PAK) 1 MG tablet Take 1 tablet (1 mg total) by mouth 2 (two) times daily. 180 tablet 1   varenicline  (CHANTIX ) 1 MG tablet Take 1 tablet (1 mg total) by mouth 2 (two) times daily. 60 tablet 3   No current facility-administered medications for this visit.    Musculoskeletal: Strength & Muscle Tone: within normal limits Gait & Station: normal Patient leans: N/A  Psychiatric Specialty Exam:  Psychiatric Specialty Exam: Blood pressure (!) 140/90, pulse 75, height 5' 8 (1.727  m), weight 212 lb (96.2 kg).Body mass index is 32.23 kg/m. Review of Systems  General Appearance: Casual and Fairly Groomed  Eye Contact:  Good  Speech:  Clear and Coherent and Normal Rate  Volume:  Normal  Mood:  Euthymic  Affect:  Appropriate and Congruent  Thought Content: Logical   Suicidal Thoughts:  No  Homicidal Thoughts:  No  Thought Process:  Goal Directed  Orientation:  Full (Time, Place, and Person)    Memory: Immediate;   Good Recent;   Good Remote;   Good  Judgment:  Fair  Insight:  Fair  Concentration:  Concentration: Good and Attention Span: Good  Recall:  not formally assessed   Fund of Knowledge: Good  Language: Good  Psychomotor Activity:  Normal  Akathisia:  No  AIMS (if indicated): not done  Assets:  Communication Skills Desire for Improvement Financial Resources/Insurance Housing Intimacy Leisure Time Physical Health Resilience Transportation  ADL's:  Intact  Cognition: WNL  Sleep:  Fair    Screenings: GAD-7    Flowsheet Row [facility] Office Visit from 01/20/2024 in [facility] A Dept Of [facility] Office Visit from 01/14/2023 in [facility] - A Dept Of [facility] Office Visit from 08/08/2020 in [facility] - A Dept Of [facility] Office Visit from 12/10/2015 in [facility] A Dept Of [facility]  Total GAD-7 Score 15 10 7  0 6   PHQ2-9    Flowsheet Row Office Visit from 04/06/2024 in [facility] Office Visit from 01/20/2024 in [facility] - [facility] Of [facility] Office Visit from 07/22/2023 in [facility] - A Dept Of [facility] Office Visit from 01/14/2023 in [facility] - A Dept Of [facility] Office Visit from 10/08/2022 in [facility] - A Dept Of [facility]  PHQ-2 Total Score 4 3 0 2 6  PHQ-9 Total Score 14 8 -- 6 15   Flowsheet Row Office Visit from 04/06/2024 in [facility] from 10[facility]14/2023 in [facility] Admission (Discharged) from 04/16/2023 in [facility]  C-SSRS RISK CATEGORY Low Risk No Risk No Risk     Collaboration of Care: Other Dr. Mercy, PCP notes  Patient/Guardian was advised Release of Information must be obtained prior to any record release in order to collaborate their care with an outside provider. Patient/Guardian was advised if they have not already done so to contact the registration department to sign all necessary forms in order for us  to release information regarding their care.   Consent: Patient/Guardian gives verbal consent for treatment and assignment of benefits for services provided during this visit. Patient/Guardian expressed understanding and agreed to proceed.   Nicolemarie Wooley, MD 11/19/202511:26 AM

## 2024-05-11 ENCOUNTER — Ambulatory Visit (HOSPITAL_BASED_OUTPATIENT_CLINIC_OR_DEPARTMENT_OTHER)

## 2024-05-11 DIAGNOSIS — F32A Depression, unspecified: Secondary | ICD-10-CM

## 2024-05-11 DIAGNOSIS — G4700 Insomnia, unspecified: Secondary | ICD-10-CM

## 2024-05-11 DIAGNOSIS — F419 Anxiety disorder, unspecified: Secondary | ICD-10-CM

## 2024-05-11 MED ORDER — SERTRALINE HCL 25 MG PO TABS: 25.0000 mg | ORAL_TABLET | Freq: Every day | ORAL | 2 refills | Status: DC

## 2024-05-11 MED ORDER — TRAZODONE HCL 50 MG PO TABS: 50.0000 mg | ORAL_TABLET | Freq: Every evening | ORAL | 1 refills | Status: DC | PRN

## 2024-05-11 NOTE — Addendum Note (Signed)
 Addended by: CARVIN CROCK on: 05/11/2024 11:55 AM   Modules accepted: Level of Service

## 2024-05-17 ENCOUNTER — Other Ambulatory Visit: Payer: Self-pay | Admitting: Nurse Practitioner

## 2024-05-17 ENCOUNTER — Other Ambulatory Visit: Payer: Self-pay | Admitting: Cardiovascular Disease

## 2024-05-17 NOTE — Telephone Encounter (Signed)
 Please advise [facility]

## 2024-06-01 ENCOUNTER — Ambulatory Visit: Admitting: Dermatology

## 2024-06-01 ENCOUNTER — Encounter: Payer: Self-pay | Admitting: Dermatology

## 2024-06-01 DIAGNOSIS — C44311 Basal cell carcinoma of skin of nose: Secondary | ICD-10-CM | POA: Diagnosis not present

## 2024-06-01 DIAGNOSIS — L814 Other melanin hyperpigmentation: Secondary | ICD-10-CM

## 2024-06-01 DIAGNOSIS — C4491 Basal cell carcinoma of skin, unspecified: Secondary | ICD-10-CM

## 2024-06-01 DIAGNOSIS — L578 Other skin changes due to chronic exposure to nonionizing radiation: Secondary | ICD-10-CM | POA: Diagnosis not present

## 2024-06-01 MED ORDER — TRAMADOL HCL 50 MG PO TABS: 50.0000 mg | ORAL_TABLET | Freq: Four times a day (QID) | ORAL | 0 refills | Status: AC | PRN

## 2024-06-01 NOTE — Patient Instructions (Signed)

## 2024-06-01 NOTE — Progress Notes (Signed)
 Follow-Up Visit   Subjective  Alex Charles is a 58 y.o. male who presents for the following: Mohs of a Nodular Basal Cell Carcinoma on the tip of nose, biopsied by Dr. Corey.   The following portions of the chart were reviewed this encounter and updated as appropriate: medications, allergies, medical history  Review of Systems:  No other skin or systemic complaints except as noted in HPI or Assessment and Plan.  Objective  Well appearing patient in no apparent distress; mood and affect are within normal limits.  A focused examination was performed of the following areas: Mid tip of nose Relevant physical exam findings are noted in the Assessment and Plan.   Mid Tip of Nose Pink nodule   Assessment & Plan   BASAL CELL CARCINOMA (BCC), UNSPECIFIED SITE Mid Tip of Nose Mohs surgery  Consent obtained: written  Anticoagulation: Was the anticoagulation regimen changed prior to Mohs? No    Anesthesia: Anesthesia method: local infiltration Local anesthetic: lidocaine  1% WITH epi  Procedure Details: Timeout: pre-procedure verification complete Procedure Prep: patient was prepped and draped in usual sterile fashion Biopsy accession number: [id number] Pre-Op diagnosis: basal cell carcinoma BCC subtype: nodular MohsAIQ Surgical site (if tumor spans multiple areas, please select predominant area): nose Surgery side: midline Surgical site (from skin exam): Mid Tip of Nose Pre-operative length (cm): 0.6 Pre-operative width (cm): 0.6 Indications for Mohs surgery: anatomic location where tissue conservation is critical  Micrographic Surgery Details: Post-operative length (cm): 1 Post-operative width (cm): 1.1 Number of Mohs stages: 1 Post surgery depth of defect: skeletal muscle  Stage 1    Tumor features identified on Mohs section: no tumor identified  Reconstruction: Was the defect reconstructed? Yes   Was reconstruction performed by the same Mohs surgeon? Yes    Setting of reconstruction: outpatient office When was reconstruction performed? same day Type of reconstruction: flap Type of flap: advancement    Related Medications traMADol (ULTRAM) 50 MG tablet Take 1 tablet (50 mg total) by mouth every 6 (six) hours as needed for up to 8 days.   Return in about 4 weeks (around 06/29/2024) for wound check,10 days wound check /suture removal.  I, Darice Smock, CMA, am acting as scribe for RUFUS CHRISTELLA COREY, MD.    06/01/2024  HISTORY OF PRESENT ILLNESS  Alex Charles is seen in consultation at the request of Dr. Corey for biopsy-proven Nodular Basal Cell Carcinoma. They note that the area has been present for about 6 months increasing in size with time.  There is no history of previous treatment.  Reports no other new or changing lesions and has no other complaints today.  Medications and allergies: see patient chart.  Review of systems: Reviewed 8 systems and notable for the above skin cancer.  All other systems reviewed are unremarkable/negative, unless noted in the HPI. Past medical history, surgical history, family history, social history were also reviewed and are noted in the chart/questionnaire.    PHYSICAL EXAMINATION  General: Well-appearing, in no acute distress, alert and oriented x 4. Vitals reviewed in chart (if available).   Skin: Exam reveals a 0.6 x 0.6 cm erythematous papule and biopsy scar on the nasal tip. There are rhytids, telangiectasias, and lentigines, consistent with photodamage.  Biopsy report(s) reviewed, confirming the diagnosis.   ASSESSMENT  1) Nodular Basal Cell Carcinoma on the nasal tip 2) photodamage 3) solar lentigines   PLAN   1. Due to location, size, histology, or recurrence and the likelihood of subclinical extension as  well as the need to conserve normal surrounding tissue, the patient was deemed acceptable for Mohs micrographic surgery (MMS).  The nature and purpose of the procedure, associated  benefits and risks including recurrence and scarring, possible complications such as pain, infection, and bleeding, and alternative methods of treatment if appropriate were discussed with the patient during consent. The lesion location was verified by the patient, by reviewing previous notes, pathology reports, and by photographs as well as angulation measurements if available.  Informed consent was reviewed and signed by the patient, and timeout was performed at 8: 30 AM. See op note below.  2. For the photodamage and solar lentigines, sun protection discussed/information given on OTC sunscreens, and we recommend continued regular follow-up with primary dermatologist every 6 months or sooner for any growing, bleeding, or changing lesions. 3. Prognosis and future surveillance discussed. 4. Letter with treatment outcome sent to referring provider. 5. Pain acetaminophen /ibuprofen/tramadol 50 mg  MOHS MICROGRAPHIC SURGERY AND RECONSTRUCTION  Initial size:   0.6 x 0.6 cm Surgical defect/wound size: 1.1 x 1.0 cm Anesthesia:    0.33% lidocaine  with 1:200,000 epinephrine EBL:    <5 mL Complications:  None Repair type:   Adjacent Tissue Transfer (East to West Advancement flap) SQ suture:   5-0 Vicryl Cutaneous suture:  5-0 Polyprolene Final size of the repair: 3.5 x 1.1 = 3.85 cm^2  Stages: 1  STAGE I: Anesthesia achieved with 0.5% lidocaine  with 1:200,000 epinephrine. ChloraPrep applied. 1 section(s) excised using Mohs technique (this includes total peripheral and deep tissue margin excision and evaluation with frozen sections, excised and interpreted by the same physician). The tumor was first debulked and then excised with an approx. 2mm margin.  Hemostasis was achieved with electrocautery as needed.  The specimen was then oriented, subdivided/relaxed, inked, and processed using Mohs technique.    Frozen section analysis revealed a clear deep and peripheral margin.   Reconstruction  PROCEDURE:  Advancement Flap The nature of the procedure was discussed with the patient in detail, including alternatives.  The risks discussed included but not limited to potential for infection, bleeding, scar formation, and damage to underlying structures.  The patient understood the risks and signed the consent form (scanned into chart).  This wound was reconstructed with an advancement flap.  Local anesthesia was achieved with the anesthetic indicated above.  The operative site was prepped with a surgical antiseptic solution, and then draped with sterile towels to insure a sterile field.  The beveled edges of the wound were excised to 90 degrees relative to the surface skin plane.  The wound was undermined in all directions, and meticulous hemostasis was achieved with an electrosurgical device.  Relaxing incisions were made, if necessary, for lateral tension release.  The tissue was advanced and closed centrally, and redundant tissue was trimmed as necessary.  The wound was sutured in a layered fashion to close potential dead space and to precisely and securely approximate the wound edges.  The dimensions of the flap were:  3.5 cm x 1.1 cm for a total flap surface area of 3.85 centimeters squared (cm2).    The wound was covered with petrolatum and a dressing.  The patient understands the need to return immediately for any signs of infection to include swelling, pain, purulent discharge, localized warmth, or fever.  Contact information was provided to the patient (including after-hours pager numbers)     Documentation: I have reviewed the above documentation for accuracy and completeness, and I agree with the above.  Karim Aiello  CHRISTELLA HOLY, MD

## 2024-06-07 ENCOUNTER — Encounter: Payer: Self-pay | Admitting: Dermatology

## 2024-06-09 ENCOUNTER — Other Ambulatory Visit: Payer: Self-pay | Admitting: Cardiology

## 2024-06-09 ENCOUNTER — Other Ambulatory Visit: Payer: Self-pay | Admitting: Cardiovascular Disease

## 2024-06-09 MED ORDER — ROSUVASTATIN CALCIUM 40 MG PO TABS: 40.0000 mg | ORAL_TABLET | Freq: Every day | ORAL | 0 refills | Status: AC

## 2024-06-09 MED ORDER — METOPROLOL SUCCINATE ER 25 MG PO TB24: 25.0000 mg | ORAL_TABLET | Freq: Every day | ORAL | 0 refills | Status: DC

## 2024-06-13 ENCOUNTER — Encounter: Payer: Self-pay | Admitting: Dermatology

## 2024-06-13 ENCOUNTER — Ambulatory Visit: Admitting: Dermatology

## 2024-06-13 DIAGNOSIS — L233 Allergic contact dermatitis due to drugs in contact with skin: Secondary | ICD-10-CM

## 2024-06-13 DIAGNOSIS — L253 Unspecified contact dermatitis due to other chemical products: Secondary | ICD-10-CM

## 2024-06-13 DIAGNOSIS — L905 Scar conditions and fibrosis of skin: Secondary | ICD-10-CM

## 2024-06-13 DIAGNOSIS — C4491 Basal cell carcinoma of skin, unspecified: Secondary | ICD-10-CM

## 2024-06-13 DIAGNOSIS — Z85828 Personal history of other malignant neoplasm of skin: Secondary | ICD-10-CM | POA: Diagnosis not present

## 2024-06-13 DIAGNOSIS — Z48817 Encounter for surgical aftercare following surgery on the skin and subcutaneous tissue: Secondary | ICD-10-CM

## 2024-06-13 DIAGNOSIS — T1490XD Injury, unspecified, subsequent encounter: Secondary | ICD-10-CM

## 2024-06-13 NOTE — Patient Instructions (Signed)

## 2024-06-13 NOTE — Progress Notes (Signed)
" ° °  Follow Up Visit   Subjective  Alex Charles is a 58 y.o. male who presents for the following: follow up from Mohs surgery   The patient presents for follow up from Mohs surgery for a BCC on the mid tip of nose, treated on 06/01/24, repaired with advancement flap. The patient has been applying neosporin under a mask once daily. The endorse the following concerns: none  The following portions of the chart were reviewed this encounter and updated as appropriate: medications, allergies, medical history  Review of Systems:  No other skin or systemic complaints except as noted in HPI or Assessment and Plan.  Objective  Well appearing patient in no apparent distress; mood and affect are within normal limits.  A focal examination was performed including scalp, head, face. All findings within normal limits unless otherwise noted below.  Healing wound with mild erythema  Relevant physical exam findings are noted in the Assessment and Plan.     Assessment & Plan  Healing Wound s/p Mohs for Alex Charles, treated on 06/01/24, repaired with advancement flap - Wound Debrided - Sutures removed.  - Reassured that wound is healing well - No evidence of infection - No swelling, induration, purulence, dehiscence, or tenderness out of proportion to the clinical exam, see photo above - Discussed that scars take up to 12 months to mature from the date of surgery - Ok to continue ointment twice daily to wound under a bandage for another week - Discussed NOT using Neopsporin  HISTORY OF BASAL CELL CARCINOMA OF THE SKIN - No evidence of recurrence today - Recommend regular full body skin exams - Recommend daily broad spectrum sunscreen SPF 30+ to sun-exposed areas, reapply every 2 hours as needed.  - Call if any new or changing lesions are noted between office visits  ALLERGIC CONTACT DERMATITIS due to Neosporin Exam: scaly plaque across wound  Flared  Treatment Plan: STOP neosporin Use vaseline or  Aquaphor     Return in about 2 weeks (around 06/27/2024) for Follow Up.  I, Alex Charles, CMA, am acting as scribe for Alex Charles.  Alex Rollene Gobble, RN, am acting as scribe for Alex Charles .   Documentation: I have reviewed the above documentation for accuracy and completeness, and I agree with the above.  Alex Charles  "

## 2024-06-19 ENCOUNTER — Other Ambulatory Visit: Payer: Self-pay | Admitting: Cardiovascular Disease

## 2024-06-23 ENCOUNTER — Other Ambulatory Visit: Payer: Self-pay | Admitting: Cardiovascular Disease

## 2024-06-27 NOTE — Progress Notes (Addendum)
 " Psychiatric follow-up  Patient Identification: Alex Charles MRN:  [id number] Date of Evaluation:  07/06/2024 Referral Source: PCP Chief Complaint:   Chief Complaint  Patient presents with   Follow-up   Medication Refill   Visit Diagnosis:    ICD-10-CM   1. Current moderate episode of major depressive disorder, unspecified whether recurrent (HCC)  F32.1 sertraline  (ZOLOFT ) 50 MG tablet    2. Insomnia, unspecified type  G47.00 traZODone  (DESYREL ) 50 MG tablet    3. GAD (generalized anxiety disorder)  F41.1 sertraline  (ZOLOFT ) 50 MG tablet         Assessment:  Alex Charles is a 59 y.o. male with a history of MDD, GAD who presents in person to [facility] for follow-up evaluation on 07/06/2024.    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  He is not actively or passively homicidal or suicidal but has been having issues with motivation, daily energy, though his energy and motivation at work seems consistent but his ability to do things at home including doing daily chores has been affected.  He has received significant benefit after starting Zoloft  in regards to the above-mentioned concern and would be appropriate to increase that to 50 mg today.  His sleep has consistently improved after being on trazodone , his appetite has been normal although his sleeping habits have worsened due to the not being motivated to cook.  He has taken all his medications as prescribed, has had no side effects or any new physical concerns.  He does have fair coping skills and insight into his condition.  He is not using any substances including alcohol or cigarettes.  Recent changes have been due to the adjustment disorder, he was undergoing therapy which he had to discontinue due to the therapist leaving the  office however he has demonstrated motivation and commitment to restart therapy at [facility] again, also provided him with resources today.  Plan to continue same medications except the changes as below, will have him back in the clinic in 6 weeks.  Risk Assessment: A suicide and violence risk assessment was performed as part of this evaluation. There patient is deemed to be at chronic elevated risk for self-harm/suicide given the following factors: N/A. These risk factors are mitigated by the following factors: lack of active SI/HI, no known access to weapons or firearms, no history of previous suicide attempts, and no history of violence. The patient is deemed to be at chronic elevated risk for violence given the following factors: N/A. These risk factors are mitigated by the following factors: no known history of violence towards others, no known violence towards others in the last 6 months, no known history of threats of harm towards others, no known homicidal ideation in the last 6 months, no command hallucinations to harm others in the last 6 months, no active symptoms of psychosis, and no active symptoms of mania. There is no acute risk for suicide or violence at this time. The patient was educated about relevant modifiable risk factors including following recommendations for treatment of psychiatric illness and abstaining from substance abuse.  While future psychiatric events cannot be accurately predicted, the patient does not currently require  acute inpatient psychiatric care and does not currently meet [facility]  involuntary commitment criteria.  Patient was given contact information for crisis resources, behavioral health clinic and was  instructed to call 911 for emergencies.    Plan: # MDD without psychotic features vs. Adjustment disorder with depressed mood Past medication trials: Buspirone  and hydroxyzine  and trazodone  Status of problem: New to the writer Interventions: -- Increase  Zoloft  50 mg  -- Continue trazodone  50 mg nightly as needed for sleep, can take 25 mg as needed if causing excessive drowsiness  # GAD Past medication trials: Buspirone  and hydroxyzine  Status of problem: New to the writer Interventions: -- Continue buspirone  management primary care provider -- Continue hydroxyzine  management primary care provider  History of Present Illness:   Mr. Alex Charles is a 59 year old male with a history of MDD, GAD that presented to the clinic today for follow-up. Today, he reported him being as I am doing very well .  Reported that his mood has been very very very very anxious .  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  He is reported that my stability shake.  When asked about depression he stated I am not depressed , but stated that he has been eating horribly stating that he does not like to cook I do not feel like cooking , stated that he has been picking up food from outside and nothing tastes good.  Reported that he is enjoying his job and performance at work is same, I enjoyed my job, it is mostly at home, I just sent lethargic, I know I need to do things I do not have the motivation .  Stated that he has been oversleeping.  He denied any active or passive SI/HI/AVH. He reported that he is taking all medications as prescribed and has had no side effects or with his new physical concerns.  He denied using any substances including alcohol or cigarettes.  He has maintained sobriety for the past 10 years from alcohol. Reported that he was doing therapy and his counselor had to leave but his making an effort to restart therapy at [facility] and would like additional resources for therapy today. We discussed about increasing his Zoloft  for mood/energy since he has had benefit in the past.  Discussed about taking 50 mg, monitor the changes or  side effects.  Also discussed about continuing rest of the medications.  Will have him back in the clinic in 6 weeks.  Associated Signs/Symptoms: Depression Symptoms:  depressed mood, insomnia, feelings of worthlessness/guilt, (Hypo) Manic Symptoms:  None Anxiety Symptoms:  Excessive Worry, Psychotic Symptoms:  None PTSD Symptoms: Negative  Past Psychiatric History:  Past psychiatric diagnoses: MDD, GAD Psychiatric hospitalizations:Denies Past suicide attempts: Denies Hx of self harm: Denies Hx of violence towards others: Denies Prior psychiatric providers: Denies Prior therapy: Denies Access to firearms: Denies  Prior medication trials: Buspirone  30 mg, Trazodone  50 mg  Substance use: Denies  Past Medical History:  Past Medical History:  Diagnosis Date   Alcoholism in recovery (HCC)    happened 4 years ago/ went thru detox in the hospital   Allergy    seasonal   Anxiety    Basal cell carcinoma of skin 03/30/2024   mid tip of nose- tx Dr. Corey Mohs 06/01/2024   Blockage of coronary artery of heart (HCC)    1 stent put in/ in 2015   CHF (congestive heart failure) (HCC)    Depression    Hyperlipidemia    Prediabetes     Past Surgical History:  Procedure Laterality Date   ABDOMINAL AORTOGRAM W/LOWER EXTREMITY N/A 03/02/2023   Procedure: ABDOMINAL AORTOGRAM W/LOWER EXTREMITY;  Surgeon: Court Dorn PARAS, MD;  Location: [facility];  Service: Cardiovascular;  Laterality: N/A;   ABDOMINAL AORTOGRAM W/LOWER EXTREMITY N/A 04/16/2023   Procedure: ABDOMINAL AORTOGRAM W/LOWER EXTREMITY;  Surgeon: Court Dorn PARAS, MD;  Location: [facility];  Service: Cardiovascular;  Laterality: N/A;   CORONARY ANGIOPLASTY WITH STENT PLACEMENT  2015   one stent   OTHER SURGICAL HISTORY     PERIPHERAL VASCULAR ATHERECTOMY  04/16/2023   Procedure: PERIPHERAL VASCULAR ATHERECTOMY;  Surgeon: Court Dorn PARAS, MD;  Location: [facility];  Service: Cardiovascular;;   tooth  extracted      Family Psychiatric History: NS  Family History:  Family History  Problem Relation Age of Onset   Bladder Cancer Mother    Crohn's disease Mother    Skin cancer Mother    Hypertension Father    Irritable bowel syndrome Sister     Social History:   Social History   Socioeconomic History   Marital status: Single    Spouse name: Not on file   Number of children: Not on file   Years of education: Not on file   Highest education level: Not on file  Occupational History   Not on file  Tobacco Use   Smoking status: Former    Current packs/day: 0.00    Average packs/day: 0.2 packs/day for 0.1 years    Types: Cigarettes    Start date: 03/25/2023    Quit date: 05/04/2023    Years since quitting: 1.1   Smokeless tobacco: Never   Tobacco comments:    Patient completed health coaching for smoking cessation.  Vaping Use   Vaping status: Never Used  Substance and Sexual Activity   Alcohol use: No   Drug use: Never   Sexual activity: Not Currently  Other Topics Concern   Not on file  Social History Narrative   Not on file   Social Drivers of Health   Tobacco Use: Medium Risk (06/29/2024)   Patient History    Smoking Tobacco Use: Former    Smokeless Tobacco Use: Never    Passive Exposure: Not on Actuary Strain: Not on file  Food Insecurity: No Food Insecurity (07/29/2023)   Hunger Vital Sign    Worried About Running Out of Food in the Last Year: Never true    Ran Out of Food in the Last Year: Never true  Transportation Needs: No Transportation Needs (07/29/2023)   PRAPARE - Administrator, Civil Service (Medical): No    Lack of Transportation (Non-Medical): No  Physical Activity: Not on file  Stress: Not on file  Social Connections: Not on file  Depression (PHQ2-9): High Risk (04/06/2024)   Depression (PHQ2-9)    PHQ-2 Score: 14  Alcohol Screen: Not on file  Housing: Unknown (07/29/2023)   Housing Stability Vital Sign    Unable  to Pay for Housing in the Last Year: No    Number of Times Moved in the Last Year: Not on file    Homeless in the Last Year: No  Utilities: Not At Risk (07/29/2023)   AHC Utilities    Threatened with loss of utilities: No  Health Literacy: Not on file    Additional Social History: 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  Allergies:  No Known Allergies  Metabolic Disorder Labs: Lab Results  Component Value  Date   HGBA1C 6.1 (A) 01/20/2024   MPG 120 (H) 04/02/2015   No results found for: PROLACTIN Lab Results  Component Value Date   CHOL 113 01/20/2024   TRIG 93 01/20/2024   HDL 38 (L) 01/20/2024   CHOLHDL 3.0 01/20/2024   VLDL 25 04/17/2023   LDLCALC 57 01/20/2024   LDLCALC 26 04/17/2023   Lab Results  Component Value Date   TSH 1.880 01/04/2020    Therapeutic Level Labs: No results found for: LITHIUM No results found for: CBMZ No results found for: VALPROATE  Current Medications: Current Outpatient Medications  Medication Sig Dispense Refill   albuterol  (VENTOLIN  HFA) 108 (90 Base) MCG/ACT inhaler TAKE 2 PUFFS BY MOUTH EVERY 6 HOURS AS NEEDED FOR WHEEZE OR SHORTNESS OF BREATH 8.5 each 0   aspirin  EC 81 MG tablet Take 1 tablet (81 mg total) by mouth daily. 30 tablet 11   busPIRone  (BUSPAR ) 30 MG tablet TAKE 1 TABLET BY MOUTH TWICE A DAY 180 tablet 2   carboxymethylcellulose (REFRESH PLUS) 0.5 % SOLN 1 drop 3 (three) times daily as needed (dry/irritated eyes.).     cetirizine  (ZYRTEC ) 10 MG tablet TAKE 1 TABLET BY MOUTH EVERY DAY 90 tablet 3   cilostazol  (PLETAL ) 100 MG tablet TAKE 1 TABLET BY MOUTH TWICE A DAY APPOINTMENT FOR REFILL 60 tablet 0   clopidogrel  (PLAVIX ) 75 MG tablet TAKE 1 TABLET BY MOUTH DAILY WITH BREAKFAST 90 tablet 3   Cyanocobalamin (VITAMIN B-12 PO) Take 1 tablet by mouth daily as needed (energy).     diclofenac  (VOLTAREN ) 75 MG EC tablet TAKE 1 TABLET BY MOUTH 2 TIMES DAILY AS NEEDED. 180 tablet 1   diclofenac  Sodium (VOLTAREN )  1 % GEL Apply 4 g topically 4 (four) times daily. Apply to affected areas 4 times daily as needed for pain. 100 g 2   ezetimibe  (ZETIA ) 10 MG tablet Take 1 tablet (10 mg total) by mouth daily. 15 tablet 0   fenofibrate  160 MG tablet TAKE 1 TABLET BY MOUTH EVERY DAY 90 tablet 1   hydrOXYzine  (ATARAX ) 10 MG tablet TAKE 1 TABLET BY MOUTH THREE TIMES A DAY AS NEEDED 270 tablet 2   metoprolol  succinate (TOPROL -XL) 25 MG 24 hr tablet TAKE 1 TABLET (25 MG TOTAL) BY MOUTH DAILY. 15 tablet 0   rosuvastatin  (CRESTOR ) 40 MG tablet Take 1 tablet (40 mg total) by mouth daily. 15 tablet 0   sertraline  (ZOLOFT ) 50 MG tablet Take 1 tablet (50 mg total) by mouth daily. 30 tablet 2   traZODone  (DESYREL ) 50 MG tablet Take 1 tablet (50 mg total) by mouth at bedtime as needed. for sleep 90 tablet 1   varenicline  (CHANTIX  CONTINUING MONTH PAK) 1 MG tablet Take 1 tablet (1 mg total) by mouth 2 (two) times daily. 180 tablet 1   varenicline  (CHANTIX ) 1 MG tablet Take 1 tablet (1 mg total) by mouth 2 (two) times daily. 60 tablet 3   No current facility-administered medications for this visit.    Musculoskeletal: Strength & Muscle Tone: within normal limits Gait & Station: normal Patient leans: N/A  Psychiatric Specialty Exam:  Psychiatric Specialty Exam: Blood pressure 120/84, pulse 72, height 5' 8 (1.727 m), weight 212 lb (96.2 kg).Body mass index is 32.23 kg/m. Review of Systems  General Appearance: Casual and Fairly Groomed  Eye Contact:  Good  Speech:  Clear and Coherent and Normal Rate  Volume:  Normal  Mood:  Euthymic  Affect:  Appropriate and Congruent  Thought  Content: Logical   Suicidal Thoughts:  No  Homicidal Thoughts:  No  Thought Process:  Goal Directed  Orientation:  Full (Time, Place, and Person)    Memory: Immediate;   Good Recent;   Good Remote;   Good  Judgment:  Fair  Insight:  Fair  Concentration:  Concentration: Good and Attention Span: Good  Recall:  not formally assessed    Fund of Knowledge: Good  Language: Good  Psychomotor Activity:  Normal  Akathisia:  No  AIMS (if indicated): not done  Assets:  Communication Skills Desire for Improvement Financial Resources/Insurance Housing Intimacy Leisure Time Physical Health Resilience Transportation  ADL's:  Intact  Cognition: WNL  Sleep:  Fair    Screenings: GAD-7    Flowsheet Row [facility] Office Visit from 01/20/2024 in [facility] Office Visit from 01/14/2023 in [facility] Office Visit from 08/08/2020 in [facility] Of [facility] Office Visit from 12/10/2015 in [facility]  Total GAD-7 Score 15 10 7  0 6   PHQ2-9    Flowsheet Row Office Visit from 04/06/2024 in [facility] Office Visit from 01/20/2024 in [facility] Of [facility] Office Visit from 07/22/2023 in [facility] Of [facility] Office Visit from 01/14/2023 in [facility] Of [facility] Office Visit from 10/08/2022 in [facility] Dept Of [facility]  PHQ-2 Total Score 4 3 0 2 6  PHQ-9 Total Score 14 8 -- 6 15   Flowsheet Row Office Visit from 04/06/2024 in [facility] UC from 10/14/2023 in [facility] Admission (Discharged) from 04/16/2023 in [facility]  C-SSRS RISK CATEGORY Low Risk No Risk No Risk     Collaboration of Care: Other Dr. Carvin, PCP notes  Patient/Guardian was advised Release of Information must be obtained prior to any  record release in order to collaborate their care with an outside provider. Patient/Guardian was advised if they have not already done so to contact the registration department to sign all necessary forms in order for us  to release information regarding their care.   Consent: Patient/Guardian gives verbal consent for treatment and assignment of benefits for services provided during this visit. Patient/Guardian expressed understanding and agreed to proceed.   Evelena Masci, MD 1/14/202611:43 AM "

## 2024-06-29 ENCOUNTER — Ambulatory Visit: Admitting: Dermatology

## 2024-06-29 ENCOUNTER — Encounter: Payer: Self-pay | Admitting: Dermatology

## 2024-06-29 VITALS — BP 121/76 | HR 58

## 2024-06-29 DIAGNOSIS — L905 Scar conditions and fibrosis of skin: Secondary | ICD-10-CM

## 2024-06-29 DIAGNOSIS — Z85828 Personal history of other malignant neoplasm of skin: Secondary | ICD-10-CM

## 2024-06-29 DIAGNOSIS — T1490XD Injury, unspecified, subsequent encounter: Secondary | ICD-10-CM

## 2024-06-29 DIAGNOSIS — C4491 Basal cell carcinoma of skin, unspecified: Secondary | ICD-10-CM

## 2024-06-29 NOTE — Progress Notes (Signed)
" ° °  Follow Up Visit   Subjective  Alex Charles is a 59 y.o. male who presents for the following: follow up from Mohs surgery   The patient presents for follow up from Mohs surgery for a BCC on the mid tip of nose, treated on 06/01/24, repaired with advancement flap. The patient has been applying Vaseline under a bandage daily.. The endorse the following concerns: none  The following portions of the chart were reviewed this encounter and updated as appropriate: medications, allergies, medical history  Review of Systems:  No other skin or systemic complaints except as noted in HPI or Assessment and Plan.  Objective  Well appearing patient in no apparent distress; mood and affect are within normal limits.  A focal examination was performed including the nose. All findings within normal limits unless otherwise noted below.  Healing wound with mild erythema  Relevant physical exam findings are noted in the Assessment and Plan.     Assessment & Plan  Healing Wound s/p Mohs for [facility], treated on 06/01/24, repaired with advancement flap - Reassured that wound is healing well - No evidence of infection - No swelling, induration, purulence, dehiscence, or tenderness out of proportion to the clinical exam, see photo above - Discussed that scars take up to 12 months to mature from the date of surgery - Ok to continue ointment twice daily to wound under a bandage for another 2 weeks - Discussed dermabrasion in 6 weeks to improve contour  HISTORY OF BASAL CELL CARCINOMA OF THE SKIN - No evidence of recurrence today - Recommend regular full body skin exams - Recommend daily broad spectrum sunscreen SPF 30+ to sun-exposed areas, reapply every 2 hours as needed.  - Call if any new or changing lesions are noted between office visits    Return in about 6 weeks (around 08/10/2024) for Dermabrasion.  [name], RN, am acting as scribe for [name], MD .    Documentation: I have  reviewed the above documentation for accuracy and completeness, and I agree with the above.  [name], MD  "

## 2024-06-29 NOTE — Patient Instructions (Signed)

## 2024-07-06 ENCOUNTER — Ambulatory Visit (HOSPITAL_BASED_OUTPATIENT_CLINIC_OR_DEPARTMENT_OTHER)

## 2024-07-06 VITALS — BP 120/84 | HR 72 | Ht 68.0 in | Wt 212.0 lb

## 2024-07-06 DIAGNOSIS — F321 Major depressive disorder, single episode, moderate: Secondary | ICD-10-CM | POA: Diagnosis not present

## 2024-07-06 DIAGNOSIS — F411 Generalized anxiety disorder: Secondary | ICD-10-CM | POA: Diagnosis not present

## 2024-07-06 DIAGNOSIS — G4700 Insomnia, unspecified: Secondary | ICD-10-CM

## 2024-07-06 DIAGNOSIS — F419 Anxiety disorder, unspecified: Secondary | ICD-10-CM

## 2024-07-06 DIAGNOSIS — F32A Depression, unspecified: Secondary | ICD-10-CM

## 2024-07-06 MED ORDER — SERTRALINE HCL 50 MG PO TABS: 50.0000 mg | ORAL_TABLET | Freq: Every day | ORAL | 2 refills | Status: AC

## 2024-07-06 MED ORDER — TRAZODONE HCL 50 MG PO TABS: 50.0000 mg | ORAL_TABLET | Freq: Every evening | ORAL | 1 refills | Status: AC | PRN

## 2024-07-06 NOTE — Addendum Note (Signed)
 Addended by: CARVIN CROCK on: 07/06/2024 03:59 PM   Modules accepted: Level of Service

## 2024-07-19 ENCOUNTER — Other Ambulatory Visit: Payer: Self-pay | Admitting: Cardiovascular Disease

## 2024-07-20 NOTE — Telephone Encounter (Signed)
 Tried to call patient. No answer and no voicemail to leave message. Patient needs to schedule appointment when he calls back.

## 2024-07-27 ENCOUNTER — Ambulatory Visit (INDEPENDENT_AMBULATORY_CARE_PROVIDER_SITE_OTHER): Payer: Self-pay | Admitting: Nurse Practitioner

## 2024-07-27 ENCOUNTER — Encounter: Payer: Self-pay | Admitting: Nurse Practitioner

## 2024-07-27 VITALS — BP 129/72 | HR 69 | Temp 97.4°F | Wt 216.0 lb

## 2024-07-27 DIAGNOSIS — R7303 Prediabetes: Secondary | ICD-10-CM

## 2024-07-27 DIAGNOSIS — Z1329 Encounter for screening for other suspected endocrine disorder: Secondary | ICD-10-CM | POA: Diagnosis not present

## 2024-07-27 DIAGNOSIS — F419 Anxiety disorder, unspecified: Secondary | ICD-10-CM | POA: Diagnosis not present

## 2024-07-27 DIAGNOSIS — Z125 Encounter for screening for malignant neoplasm of prostate: Secondary | ICD-10-CM | POA: Diagnosis not present

## 2024-07-27 LAB — POCT GLYCOSYLATED HEMOGLOBIN (HGB A1C): Hemoglobin A1C: 5.9 % — AB (ref 4.0–5.6)

## 2024-07-27 MED ORDER — AZITHROMYCIN 250 MG PO TABS: ORAL_TABLET | ORAL | 0 refills | Status: AC

## 2024-07-27 MED ORDER — HYDROXYZINE HCL 10 MG PO TABS: 10.0000 mg | ORAL_TABLET | Freq: Three times a day (TID) | ORAL | 6 refills | Status: AC | PRN

## 2024-07-27 NOTE — Progress Notes (Signed)
 "  Subjective   Patient ID: Alex Charles, male    DOB: 08/18/1965, 59 y.o.   MRN: [id number]  Chief Complaint  Patient presents with   Follow-up    6 month.    Shoulder Pain    Has a weird pain in left shoulder. X 2 weeks.     Referring provider: Oley Bascom RAMAN, NP  Dejan Angert is a 59 y.o. male with Past Medical History: No date: Alcoholism in recovery [facility])     Comment:  happened 4 years ago/ went thru detox in the hospital No date: Allergy     Comment:  seasonal No date: Anxiety 03/30/2024: Basal cell carcinoma of skin     Comment:  mid tip of nose- tx Dr. Corey Mohs 06/01/2024 No date: Blockage of coronary artery of heart (HCC)     Comment:  1 stent put in/ in 2015 No date: CHF (congestive heart failure) (HCC) No date: Depression No date: Hyperlipidemia No date: Prediabetes   HPI   Prediabetes:    Hemoglobin A1c stable today at 5.9.  Patient is trying to watch diet.  Will order labs today:    - POCT glycosylated hemoglobin (Hb A1C) - CBC - Comprehensive metabolic panel     Anxiety and depression   Patient has been doing well with BuSpar  and hydroxyzine .  He was following counseling.  He is also getting counseling through work. States that he is having some paranoid thoughts.  He is concerned that maybe he is having medication interactions causing this.  I have sent a message to the pharmacist to look over his medications.  We will also get him in with psychiatry for medication management.     PAD:   Patient has been following with cardiology for PAD.  He has been started on Pletal . No longer smoking.    Denies f/c/s, n/v/d, hemoptysis, PND, leg swelling Denies chest pain or edema  Note: Patient is concerned today that he may have atypical pneumonia.  He states his lungs feel like they did in the past when he had this.  Lung sounds are clear on exam today we will send in a prescription for azithromycin  in case symptoms worsen over the  weekend.   Allergies[1]  Immunization History  Administered Date(s) Administered   Influenza,inj,Quad PF,6+ Mos 04/02/2015, 02/17/2018, 02/13/2019   Influenza-Unspecified 04/15/2023   Moderna Sars-Covid-2 Vaccination 10/17/2019, 11/15/2019, 07/02/2020   Tdap 04/08/2017   Zoster Recombinant(Shingrix ) 01/20/2019, 07/09/2022    Tobacco History: Tobacco Use History[2] Counseling given: Not Answered Tobacco comments: Patient completed health coaching for smoking cessation.   Outpatient Encounter Medications as of 07/27/2024  Medication Sig   albuterol  (VENTOLIN  HFA) 108 (90 Base) MCG/ACT inhaler TAKE 2 PUFFS BY MOUTH EVERY 6 HOURS AS NEEDED FOR WHEEZE OR SHORTNESS OF BREATH   aspirin  EC 81 MG tablet Take 1 tablet (81 mg total) by mouth daily.   azithromycin  (ZITHROMAX ) 250 MG tablet Take 2 tablets on day 1, then 1 tablet daily on days 2 through 5   busPIRone  (BUSPAR ) 30 MG tablet TAKE 1 TABLET BY MOUTH TWICE A DAY   carboxymethylcellulose (REFRESH PLUS) 0.5 % SOLN 1 drop 3 (three) times daily as needed (dry/irritated eyes.).   cetirizine  (ZYRTEC ) 10 MG tablet TAKE 1 TABLET BY MOUTH EVERY DAY   cilostazol  (PLETAL ) 100 MG tablet Take 1 tablet (100 mg total) by mouth 2 (two) times daily. Please call Dr. Ranee office take make an appointment to receive future refills [phone]   clopidogrel  (  PLAVIX ) 75 MG tablet TAKE 1 TABLET BY MOUTH DAILY WITH BREAKFAST   diclofenac  (VOLTAREN ) 75 MG EC tablet TAKE 1 TABLET BY MOUTH 2 TIMES DAILY AS NEEDED.   ezetimibe  (ZETIA ) 10 MG tablet Take 1 tablet (10 mg total) by mouth daily.   fenofibrate  160 MG tablet TAKE 1 TABLET BY MOUTH EVERY DAY   metoprolol  succinate (TOPROL -XL) 25 MG 24 hr tablet TAKE 1 TABLET (25 MG TOTAL) BY MOUTH DAILY.   rosuvastatin  (CRESTOR ) 40 MG tablet Take 1 tablet (40 mg total) by mouth daily.   sertraline  (ZOLOFT ) 50 MG tablet Take 1 tablet (50 mg total) by mouth daily.   traZODone  (DESYREL ) 50 MG tablet Take 1 tablet (50 mg  total) by mouth at bedtime as needed. for sleep   varenicline  (CHANTIX  CONTINUING MONTH PAK) 1 MG tablet Take 1 tablet (1 mg total) by mouth 2 (two) times daily.   varenicline  (CHANTIX ) 1 MG tablet Take 1 tablet (1 mg total) by mouth 2 (two) times daily.   [DISCONTINUED] hydrOXYzine  (ATARAX ) 10 MG tablet TAKE 1 TABLET BY MOUTH THREE TIMES A DAY AS NEEDED   Cyanocobalamin (VITAMIN B-12 PO) Take 1 tablet by mouth daily as needed (energy). (Patient not taking: Reported on 07/27/2024)   diclofenac  Sodium (VOLTAREN ) 1 % GEL Apply 4 g topically 4 (four) times daily. Apply to affected areas 4 times daily as needed for pain. (Patient not taking: Reported on 07/27/2024)   hydrOXYzine  (ATARAX ) 10 MG tablet Take 1 tablet (10 mg total) by mouth 3 (three) times daily as needed.   No facility-administered encounter medications on file as of 07/27/2024.    Review of Systems  Review of Systems  Constitutional: Negative.   HENT: Negative.    Cardiovascular: Negative.   Gastrointestinal: Negative.   Allergic/Immunologic: Negative.   Neurological: Negative.   Psychiatric/Behavioral: Negative.       Objective:   BP 129/72   Pulse 69   Temp (!) 97.4 F (36.3 C) (Temporal)   Wt 216 lb (98 kg)   SpO2 98%   BMI 32.84 kg/m   Wt Readings from Last 5 Encounters:  07/27/24 216 lb (98 kg)  01/20/24 212 lb 6.4 oz (96.3 kg)  07/22/23 213 lb 12.8 oz (97 kg)  05/06/23 213 lb (96.6 kg)  04/16/23 212 lb (96.2 kg)     Physical Exam Vitals and nursing note reviewed.  Constitutional:      General: He is not in acute distress.    Appearance: He is well-developed.  Cardiovascular:     Rate and Rhythm: Normal rate and regular rhythm.  Pulmonary:     Effort: Pulmonary effort is normal.     Breath sounds: Normal breath sounds.  Skin:    General: Skin is warm and dry.  Neurological:     Mental Status: He is alert and oriented to person, place, and time.       Assessment & Plan:   Prediabetes -      CBC -     Comprehensive metabolic panel with GFR -     POCT glycosylated hemoglobin (Hb A1C)  Anxiety -     hydrOXYzine  HCl; Take 1 tablet (10 mg total) by mouth 3 (three) times daily as needed.  Dispense: 270 tablet; Refill: 6  Prostate cancer screening -     PSA  Thyroid disorder screen -     TSH  Other orders -     Azithromycin ; Take 2 tablets on day 1, then 1 tablet daily  on days 2 through 5  Dispense: 6 tablet; Refill: 0     Return in about 6 months (around 01/24/2025).     Bascom GORMAN Borer, NP 07/27/2024     [1] No Known Allergies [2]  Social History Tobacco Use  Smoking Status Former   Current packs/day: 0.00   Average packs/day: 0.2 packs/day for 0.1 years   Types: Cigarettes   Start date: 03/25/2023   Quit date: 05/04/2023   Years since quitting: 1.2  Smokeless Tobacco Never  Tobacco Comments   Patient completed health coaching for smoking cessation.   "

## 2024-07-28 ENCOUNTER — Ambulatory Visit: Payer: Self-pay | Admitting: Nurse Practitioner

## 2024-07-28 LAB — COMPREHENSIVE METABOLIC PANEL WITH GFR
ALT: 22 [IU]/L (ref 0–44)
AST: 22 [IU]/L (ref 0–40)
Albumin: 4.7 g/dL (ref 3.8–4.9)
Alkaline Phosphatase: 62 [IU]/L (ref 47–123)
BUN/Creatinine Ratio: 16 (ref 10–24)
BUN: 21 mg/dL (ref 8–27)
Bilirubin Total: 0.4 mg/dL (ref 0.0–1.2)
CO2: 23 mmol/L (ref 20–29)
Calcium: 9.7 mg/dL (ref 8.6–10.2)
Chloride: 103 mmol/L (ref 96–106)
Creatinine, Ser: 1.32 mg/dL — ABNORMAL HIGH (ref 0.76–1.27)
Globulin, Total: 2.1 g/dL (ref 1.5–4.5)
Glucose: 82 mg/dL (ref 70–99)
Potassium: 4.8 mmol/L (ref 3.5–5.2)
Sodium: 141 mmol/L (ref 134–144)
Total Protein: 6.8 g/dL (ref 6.0–8.5)
eGFR: 62 mL/min/{1.73_m2}

## 2024-07-28 LAB — CBC
Hematocrit: 46.8 % (ref 37.5–51.0)
Hemoglobin: 15.2 g/dL (ref 13.0–17.7)
MCH: 31 pg (ref 26.6–33.0)
MCHC: 32.5 g/dL (ref 31.5–35.7)
MCV: 95 fL (ref 79–97)
Platelets: 212 10*3/uL (ref 150–450)
RBC: 4.91 x10E6/uL (ref 4.14–5.80)
RDW: 13.4 % (ref 11.6–15.4)
WBC: 6.1 10*3/uL (ref 3.4–10.8)

## 2024-07-28 LAB — TSH: TSH: 2.1 u[IU]/mL (ref 0.450–4.500)

## 2024-07-28 LAB — PSA: Prostate Specific Ag, Serum: 0.6 ng/mL (ref 0.0–4.0)

## 2024-08-10 ENCOUNTER — Ambulatory Visit (HOSPITAL_COMMUNITY)

## 2024-08-17 ENCOUNTER — Ambulatory Visit: Admitting: Dermatology

## 2024-08-24 ENCOUNTER — Ambulatory Visit (HOSPITAL_COMMUNITY)

## 2024-09-28 ENCOUNTER — Ambulatory Visit: Admitting: Dermatology

## 2025-01-25 ENCOUNTER — Ambulatory Visit: Payer: Self-pay | Admitting: Nurse Practitioner
